# Patient Record
Sex: Male | Born: 1955 | Race: Black or African American | Hispanic: No | Smoking: Never smoker
Health system: Southern US, Community
[De-identification: ages and names within clinical notes are randomized; demographics above are authoritative.]

## PROBLEM LIST (undated history)

## (undated) DIAGNOSIS — N529 Male erectile dysfunction, unspecified: Secondary | ICD-10-CM

## (undated) DIAGNOSIS — M1990 Unspecified osteoarthritis, unspecified site: Secondary | ICD-10-CM

## (undated) DIAGNOSIS — I10 Essential (primary) hypertension: Secondary | ICD-10-CM

## (undated) DIAGNOSIS — E291 Testicular hypofunction: Secondary | ICD-10-CM

## (undated) HISTORY — PX: REPLACEMENT TOTAL KNEE: SUR1224

## (undated) HISTORY — DX: Testicular hypofunction: E29.1

## (undated) HISTORY — DX: Unspecified osteoarthritis, unspecified site: M19.90

## (undated) HISTORY — DX: Male erectile dysfunction, unspecified: N52.9

## (undated) HISTORY — DX: Essential (primary) hypertension: I10

---

## 2005-05-20 ENCOUNTER — Ambulatory Visit: Payer: Self-pay | Admitting: Internal Medicine

## 2005-05-26 ENCOUNTER — Ambulatory Visit: Payer: Self-pay | Admitting: Internal Medicine

## 2005-07-06 ENCOUNTER — Ambulatory Visit: Payer: Self-pay | Admitting: *Deleted

## 2005-07-22 ENCOUNTER — Ambulatory Visit: Payer: Self-pay | Admitting: *Deleted

## 2005-07-31 ENCOUNTER — Ambulatory Visit: Payer: Self-pay | Admitting: *Deleted

## 2005-08-07 ENCOUNTER — Ambulatory Visit: Payer: Self-pay | Admitting: *Deleted

## 2005-08-24 ENCOUNTER — Ambulatory Visit: Payer: Self-pay | Admitting: *Deleted

## 2005-08-25 ENCOUNTER — Ambulatory Visit: Payer: Self-pay | Admitting: Internal Medicine

## 2005-11-23 ENCOUNTER — Ambulatory Visit: Payer: Self-pay | Admitting: Internal Medicine

## 2006-08-31 ENCOUNTER — Ambulatory Visit: Payer: Self-pay | Admitting: Internal Medicine

## 2006-08-31 LAB — CONVERTED CEMR LAB
Alkaline Phosphatase: 54 units/L (ref 39–117)
CO2: 26 meq/L (ref 19–32)
Calcium: 9.3 mg/dL (ref 8.4–10.5)
Chloride: 105 meq/L (ref 96–112)
Chol/HDL Ratio, serum: 3.3
Cholesterol: 170 mg/dL (ref 0–200)
GFR calc non Af Amer: 75 mL/min
Glomerular Filtration Rate, Af Am: 91 mL/min/{1.73_m2}
Glucose, Bld: 117 mg/dL — ABNORMAL HIGH (ref 70–99)
Ketones, ur: NEGATIVE mg/dL
LDL Cholesterol: 98 mg/dL (ref 0–99)
Lymphocytes Relative: 31.6 % (ref 12.0–46.0)
MCV: 92.7 fL (ref 78.0–100.0)
Monocytes Relative: 5 % (ref 3.0–11.0)
Nitrite: NEGATIVE
Platelets: 200 10*3/uL (ref 150–400)
Total Protein, Urine: NEGATIVE mg/dL
Urine Glucose: NEGATIVE mg/dL
Urobilinogen, UA: 0.2 (ref 0.0–1.0)

## 2006-09-03 ENCOUNTER — Ambulatory Visit (HOSPITAL_COMMUNITY): Admission: RE | Admit: 2006-09-03 | Discharge: 2006-09-03 | Payer: Self-pay | Admitting: Internal Medicine

## 2006-09-03 ENCOUNTER — Ambulatory Visit: Payer: Self-pay | Admitting: Internal Medicine

## 2006-09-03 LAB — CONVERTED CEMR LAB
Ferritin: 231.3 ng/mL (ref 22.0–322.0)
Rheumatoid Fact: <20 intl units/mL — ABNORMAL LOW (ref 0.0–20.0)

## 2007-05-31 ENCOUNTER — Ambulatory Visit: Payer: Self-pay | Admitting: Internal Medicine

## 2007-12-13 ENCOUNTER — Ambulatory Visit: Payer: Self-pay | Admitting: Internal Medicine

## 2007-12-13 DIAGNOSIS — M1990 Unspecified osteoarthritis, unspecified site: Secondary | ICD-10-CM

## 2007-12-13 DIAGNOSIS — N529 Male erectile dysfunction, unspecified: Secondary | ICD-10-CM

## 2007-12-13 DIAGNOSIS — E559 Vitamin D deficiency, unspecified: Secondary | ICD-10-CM | POA: Insufficient documentation

## 2007-12-13 DIAGNOSIS — M79609 Pain in unspecified limb: Secondary | ICD-10-CM | POA: Insufficient documentation

## 2007-12-13 DIAGNOSIS — R7309 Other abnormal glucose: Secondary | ICD-10-CM

## 2007-12-16 ENCOUNTER — Encounter (INDEPENDENT_AMBULATORY_CARE_PROVIDER_SITE_OTHER): Payer: Self-pay | Admitting: *Deleted

## 2007-12-20 ENCOUNTER — Telehealth: Payer: Self-pay | Admitting: Internal Medicine

## 2007-12-20 LAB — CONVERTED CEMR LAB
Basophils Relative: 0.3 % (ref 0.0–1.0)
CO2: 30 meq/L (ref 19–32)
Chloride: 105 meq/L (ref 96–112)
Eosinophils Relative: 1.8 % (ref 0.0–5.0)
Folate: 9.5 ng/mL
Hemoglobin: 14.9 g/dL (ref 13.0–17.0)
Lymphocytes Relative: 30.7 % (ref 12.0–46.0)
Monocytes Relative: 7 % (ref 3.0–12.0)
Neutro Abs: 5.6 10*3/uL (ref 1.4–7.7)
Potassium: 3.9 meq/L (ref 3.5–5.1)
RBC: 4.86 M/uL (ref 4.22–5.81)
Rhuematoid fact SerPl-aCnc: <20 intl units/mL — ABNORMAL LOW (ref 0.0–20.0)
Sed Rate: 11 mm/hr (ref 0–16)
Sodium: 141 meq/L (ref 135–145)
TSH: 1.81 microintl units/mL (ref 0.35–5.50)
Uric Acid, Serum: 7.8 mg/dL (ref 4.0–7.8)

## 2007-12-30 ENCOUNTER — Encounter: Payer: Self-pay | Admitting: Internal Medicine

## 2008-08-06 ENCOUNTER — Telehealth: Payer: Self-pay | Admitting: Internal Medicine

## 2008-12-04 ENCOUNTER — Ambulatory Visit: Payer: Self-pay | Admitting: Internal Medicine

## 2008-12-04 LAB — CONVERTED CEMR LAB
AST: 26 units/L (ref 0–37)
Albumin: 4 g/dL (ref 3.5–5.2)
Alkaline Phosphatase: 70 units/L (ref 39–117)
Basophils Relative: 0.2 % (ref 0.0–3.0)
Calcium: 9.1 mg/dL (ref 8.4–10.5)
Cholesterol: 209 mg/dL — ABNORMAL HIGH (ref 0–200)
Direct LDL: 112.6 mg/dL
Eosinophils Relative: 1.1 % (ref 0.0–5.0)
GFR calc non Af Amer: 90 mL/min (ref >60)
Glucose, Bld: 262 mg/dL — ABNORMAL HIGH (ref 70–99)
HCT: 41.7 % (ref 39.0–52.0)
HDL: 57.9 mg/dL (ref >39.00)
Hemoglobin: 14.6 g/dL (ref 13.0–17.0)
Leukocytes, UA: NEGATIVE
Lymphocytes Relative: 33 % (ref 12.0–46.0)
Lymphs Abs: 2.7 10*3/uL (ref 0.7–4.0)
Monocytes Relative: 6.2 % (ref 3.0–12.0)
Neutro Abs: 4.8 10*3/uL (ref 1.4–7.7)
Nitrite: NEGATIVE
PSA: 0.79 ng/mL (ref 0.10–4.00)
Potassium: 4.4 meq/L (ref 3.5–5.1)
RBC: 4.59 M/uL (ref 4.22–5.81)
RDW: 12.2 % (ref 11.5–14.6)
Triglycerides: 197 mg/dL — ABNORMAL HIGH (ref 0.0–149.0)
Urobilinogen, UA: 0.2 (ref 0.0–1.0)
VLDL: 39.4 mg/dL (ref 0.0–40.0)
WBC: 8.1 10*3/uL (ref 4.5–10.5)

## 2008-12-11 ENCOUNTER — Ambulatory Visit: Payer: Self-pay | Admitting: Internal Medicine

## 2008-12-11 DIAGNOSIS — E119 Type 2 diabetes mellitus without complications: Secondary | ICD-10-CM | POA: Insufficient documentation

## 2008-12-11 DIAGNOSIS — E1165 Type 2 diabetes mellitus with hyperglycemia: Secondary | ICD-10-CM

## 2008-12-26 ENCOUNTER — Ambulatory Visit: Payer: Self-pay | Admitting: *Deleted

## 2008-12-28 ENCOUNTER — Telehealth: Payer: Self-pay | Admitting: Internal Medicine

## 2009-01-08 ENCOUNTER — Ambulatory Visit: Payer: Self-pay | Admitting: Internal Medicine

## 2009-01-08 DIAGNOSIS — L259 Unspecified contact dermatitis, unspecified cause: Secondary | ICD-10-CM

## 2009-01-14 ENCOUNTER — Telehealth: Payer: Self-pay | Admitting: Internal Medicine

## 2009-03-05 ENCOUNTER — Encounter: Admission: RE | Admit: 2009-03-05 | Discharge: 2009-03-12 | Payer: Self-pay | Admitting: Family Medicine

## 2010-08-15 ENCOUNTER — Telehealth: Payer: Self-pay | Admitting: Internal Medicine

## 2010-08-19 ENCOUNTER — Ambulatory Visit: Payer: Self-pay | Admitting: Internal Medicine

## 2010-08-19 DIAGNOSIS — R0609 Other forms of dyspnea: Secondary | ICD-10-CM

## 2010-08-19 DIAGNOSIS — N509 Disorder of male genital organs, unspecified: Secondary | ICD-10-CM | POA: Insufficient documentation

## 2010-08-19 DIAGNOSIS — E291 Testicular hypofunction: Secondary | ICD-10-CM | POA: Insufficient documentation

## 2010-08-19 DIAGNOSIS — R5383 Other fatigue: Secondary | ICD-10-CM

## 2010-08-19 DIAGNOSIS — R0989 Other specified symptoms and signs involving the circulatory and respiratory systems: Secondary | ICD-10-CM | POA: Insufficient documentation

## 2010-08-19 DIAGNOSIS — E538 Deficiency of other specified B group vitamins: Secondary | ICD-10-CM | POA: Insufficient documentation

## 2010-08-19 DIAGNOSIS — R5381 Other malaise: Secondary | ICD-10-CM

## 2010-08-19 LAB — CONVERTED CEMR LAB
Rhuematoid fact SerPl-aCnc: <20 intl units/mL (ref 0–20)
Vit D, 25-Hydroxy: 17 ng/mL — ABNORMAL LOW (ref 30–89)

## 2010-08-21 LAB — CONVERTED CEMR LAB
ALT: 25 units/L (ref 0–53)
AST: 21 units/L (ref 0–37)
Albumin: 4 g/dL (ref 3.5–5.2)
BUN: 18 mg/dL (ref 6–23)
Basophils Absolute: 0 10*3/uL (ref 0.0–0.1)
Bilirubin Urine: NEGATIVE
Eosinophils Relative: 2 % (ref 0.0–5.0)
GFR calc non Af Amer: 92.32 mL/min (ref >60.00)
Ketones, ur: NEGATIVE mg/dL
Leukocytes, UA: NEGATIVE
Lymphocytes Relative: 25.6 % (ref 12.0–46.0)
Lymphs Abs: 2.5 10*3/uL (ref 0.7–4.0)
Monocytes Absolute: 0.6 10*3/uL (ref 0.1–1.0)
Nitrite: NEGATIVE
Platelets: 189 10*3/uL (ref 150.0–400.0)
Potassium: 4.6 meq/L (ref 3.5–5.1)
RDW: 14.2 % (ref 11.5–14.6)
Sodium: 145 meq/L (ref 135–145)
Specific Gravity, Urine: >=1.03 (ref 1.000–1.030)
TSH: 1.07 microintl units/mL (ref 0.35–5.50)
Uric Acid, Serum: 7.7 mg/dL (ref 4.0–7.8)
Urine Glucose: NEGATIVE mg/dL
Urobilinogen, UA: 0.2 (ref 0.0–1.0)
Vitamin B-12: 141 pg/mL — ABNORMAL LOW (ref 211–911)
WBC: 9.9 10*3/uL (ref 4.5–10.5)
pH: 5 (ref 5.0–8.0)

## 2010-08-22 ENCOUNTER — Ambulatory Visit: Payer: Self-pay | Admitting: Internal Medicine

## 2010-08-26 ENCOUNTER — Telehealth: Payer: Self-pay | Admitting: Internal Medicine

## 2010-10-15 ENCOUNTER — Ambulatory Visit: Admit: 2010-10-15 | Payer: Self-pay | Admitting: Internal Medicine

## 2010-10-15 ENCOUNTER — Other Ambulatory Visit: Payer: Self-pay

## 2010-10-16 NOTE — Progress Notes (Signed)
Summary: [facility]  Phone Note From Pharmacy   Summary of Call: [facility] , pt must try and fail generic Omeprazole or Naproxen. Initial call taken by: Dagoberto Reef,  August 26, 2010 4:39 PM  Follow-up for Phone Call        ok Follow-up by: Tresa Garter MD,  August 26, 2010 10:45 PM  Additional Follow-up for Phone Call Additional follow up Details #1::        Pt's # busy...............Marland KitchenLamar Sprinkles, CMA  August 27, 2010 1:21 PM   left mess to call office back on wk #..................Marland KitchenLamar Sprinkles, CMA  August 28, 2010 2:33 PM     Additional Follow-up for Phone Call Additional follow up Details #2::    Pt informed  Follow-up by: Lamar Sprinkles, CMA,  August 28, 2010 2:39 PM  New/Updated Medications: NAPROXEN 500 MG TABS (NAPROXEN) 1 by mouth two times a day pc for pain/arthritis OMEPRAZOLE 40 MG CPDR (OMEPRAZOLE) 1 by mouth qam while on  Naproxen Prescriptions: OMEPRAZOLE 40 MG CPDR (OMEPRAZOLE) 1 by mouth qam while on  Naproxen  #30 x 3   Entered by:   Lamar Sprinkles, CMA   Authorized by:   Tresa Garter MD   Signed by:   Lamar Sprinkles, CMA on 08/28/2010   Method used:   Electronically to        [facility] (retail)       [address]       [place], [place]  [address]       Ph: [phone]       Fax: [phone]   RxID:   [id number] NAPROXEN 500 MG TABS (NAPROXEN) 1 by mouth two times a day pc for pain/arthritis  #60 x 3   Entered by:   Lamar Sprinkles, CMA   Authorized by:   Tresa Garter MD   Signed by:   Lamar Sprinkles, CMA on 08/28/2010   Method used:   Electronically to        [facility] (retail)       [address]       [place], [place]  [address]       Ph: [phone]       Fax: [phone]   RxID:   [id number] OMEPRAZOLE 40 MG CPDR (OMEPRAZOLE) 1 by mouth qam while on  Naproxen  #30 x 3   Entered and Authorized by:   Tresa Garter MD   Signed by:    Lamar Sprinkles, CMA on 08/27/2010   Method used:   Electronically to        [id number] (retail)       [address]       [place], [place]  [address]       Ph: [phone] or [phone]       Fax: [phone]   RxID:   [id number] NAPROXEN 500 MG TABS (NAPROXEN) 1 by mouth two times a day pc for pain/arthritis  #60 x 3   Entered by:   Lamar Sprinkles, CMA   Authorized by:   Tresa Garter MD   Signed by:   Lamar Sprinkles, CMA on 08/27/2010   Method used:   [facility]ronically to        [id number] ([facility]       [address]  [place], [place]  [address]       Ph: [phone] or [phone]       Fax: [phone]   RxID:   [id number]

## 2010-10-16 NOTE — Assessment & Plan Note (Signed)
Summary: follow up per md/start b12-lb   Vital Signs:  Patient profile:   55 year old male Height:      72 inches Weight:      232 pounds BMI:     31.58 Temp:     97.6 degrees F oral Pulse rate:   88 / minute Pulse rhythm:   regular Resp:     16 per minute BP sitting:   130 / 90  (left arm) Cuff size:   large  Vitals Entered By: Lanier Prude, Beverly Gust) (August 22, 2010 11:24 AM) CC: f/u to discuss b12 def Is Patient Diabetic? Yes   Primary Care Provider:  Tresa Garter MD  CC:  f/u to discuss b12 def.  History of Present Illness: F/u low Vit B12, low Vit D and hypogonadism  Current Medications (verified): 1)  Vitamin D3 1000 Unit  Tabs (Cholecalciferol) .Marland Kitchen.. 1 By Mouth Daily 2)  Hydrocodone-Acetaminophen 5-325 Mg  Tabs (Hydrocodone-Acetaminophen) .Marland Kitchen.. 1 By Mouth Two Times A Day As Needed Pain 3)  Levitra 20 Mg Tabs (Vardenafil Hcl) .... 1/2-1 Once Daily As Needed 4)  Colchicine 0.6 Mg Tabs (Colchicine) .Marland Kitchen.. 1 By Mouth Qd 5)  Metformin Hcl 500 Mg Tabs (Metformin Hcl) .... Take 3 in The Am and 2 in The Evening 6)  Freestyle Lite Test  Strp (Glucose Blood) .... Use Two Times A Day Prn 7)  Freestyle Lancets  Misc (Lancets) .... Two Times A Day Prn 8)  Lotrisone 1-0.05 % Crea (Clotrimazole-Betamethasone) .... Use Bid 9)  Aspir-Low 81 Mg Tbec (Aspirin) .Marland Kitchen.. 1 By Mouth Once Daily 10)  Vimovo 500-20 Mg Tbec (Naproxen-Esomeprazole) .Marland Kitchen.. 1 By Mouth Once Daily - Two Times A Day Pc As Needed Pain 11)  Doxycycline Hyclate 100 Mg Caps (Doxycycline Hyclate) .Marland Kitchen.. 1 By Mouth Two Times A Day With A Glass of Water 12)  Lotrisone 1-0.05 % Crea (Clotrimazole-Betamethasone) .... Use Bid 13)  Vitamin B-12 1000 Mcg Subl (Cyanocobalamin) .Marland Kitchen.. 1 By Mouth Qd 14)  Vitamin D (Ergocalciferol) 50000 Unit Caps (Ergocalciferol) .Marland Kitchen.. 1 By Mouth Q 1 Week X 6 Weeks Then Start Vit D 1000 International Units Qd  Allergies (verified): No Known Drug Allergies  Past History:  Past Medical  History: Last updated: 08/19/2010 Osteoarthritis Elev. glu ED Vit D def 2009 Diabetes mellitus, type II 2010 Vit B12 def 2011 Hypogonadism 2011  Social History: Last updated: 12/11/2008 Occupation: cleaning service Divorced Alcohol use-yes Regular exercise-yes  Review of Systems  The patient denies chest pain, dyspnea on exertion, and abdominal pain.    Physical Exam  General:  Well-developed,well-nourished,in no acute distress; alert,appropriate and cooperative throughout examination Nose:  External nasal examination shows no deformity or inflammation. Nasal mucosa are pink and moist without lesions or exudates. Mouth:  Oral mucosa and oropharynx without lesions or exudates.  Teeth in good repair.pharynx pink and moist.   Lungs:  Normal respiratory effort, chest expands symmetrically. Lungs are clear to auscultation, no crackles or wheezes. Heart:  Normal rate and regular rhythm. S1 and S2 normal without gallop, murmur, click, rub or other extra sounds. Abdomen:  Bowel sounds positive,abdomen soft and non-tender without masses, organomegaly or hernias noted.   Impression & Recommendations:  Problem # 1:  VITAMIN B12 DEFICIENCY (ICD-266.2) Assessment New Discussed On the regimen of medicine(s) reflected in the chart    Orders: Vit B12 1000 mcg (J3420)  Problem # 2:  HYPOGONADISM (ICD-257.2) Assessment: New  Options to treat discussed Will start Testim Risks vs benefits and  controversies of a long term Testosterone use were discussed.  Orders: Admin of Therapeutic Inj  intramuscular or subcutaneous ([address])  Problem # 3:  VITAMIN D DEFICIENCY (ICD-268.9) Assessment: Deteriorated On the regimen of medicine(s) reflected in the chart    Problem # 4:  FATIGUE (ICD-780.79) Assessment: Unchanged  Complete Medication List: 1)  Vitamin D3 1000 Unit Tabs (Cholecalciferol) .Marland Kitchen.. 1 by mouth daily 2)  Hydrocodone-acetaminophen 5-325 Mg Tabs (Hydrocodone-acetaminophen) .Marland Kitchen..  1 by mouth two times a day as needed pain 3)  Levitra 20 Mg Tabs (Vardenafil hcl) .... 1/2-1 once daily as needed 4)  Colchicine 0.6 Mg Tabs (Colchicine) .Marland Kitchen.. 1 by mouth qd 5)  Metformin Hcl 500 Mg Tabs (Metformin hcl) .... Take 3 in the am and 2 in the evening 6)  Freestyle Lite Test Strp (Glucose blood) .... Use two times a day prn 7)  Freestyle Lancets Misc (Lancets) .... Two times a day prn 8)  Lotrisone 1-0.05 % Crea (Clotrimazole-betamethasone) .... Use bid 9)  Aspir-low 81 Mg Tbec (Aspirin) .Marland Kitchen.. 1 by mouth once daily 10)  Vimovo 500-20 Mg Tbec (Naproxen-esomeprazole) .Marland Kitchen.. 1 by mouth once daily - two times a day pc as needed pain 11)  Doxycycline Hyclate 100 Mg Caps (Doxycycline hyclate) .Marland Kitchen.. 1 by mouth two times a day with a glass of water 12)  Lotrisone 1-0.05 % Crea (Clotrimazole-betamethasone) .... Use bid 13)  Vitamin B-12 1000 Mcg Subl (Cyanocobalamin) .Marland Kitchen.. 1 by mouth qd 14)  Vitamin D (ergocalciferol) 50000 Unit Caps (Ergocalciferol) .Marland Kitchen.. 1 by mouth q 1 week x 6 weeks then start vit d 1000 international units qd 15)  Testim 50 Mg/5gm Gel (Testosterone) .... Use 10 g on skin as dirr q am  Patient Instructions: 1)  Please schedule a follow-up appointment in 2 months. 2)  BMP prior to visit, ICD-9: 3)  Hepatic Panel prior to visit, ICD-9: 4)  testost 995.20 5)  vit B12 266.20 6)  vit D 268.9 Prescriptions: VITAMIN B-12 1000 MCG SUBL (CYANOCOBALAMIN) 1 by mouth qd  #100 x 3   Entered and Authorized by:   Tresa Garter MD   Signed by:   Tresa Garter MD on 08/22/2010   Method used:   Print then Give to Patient   RxID:   [id number] TESTIM 50 MG/5GM GEL (TESTOSTERONE) use 10 g on skin as dirr q am  #qs x 6   Entered and Authorized by:   Tresa Garter MD   Signed by:   Tresa Garter MD on 08/22/2010   Method used:   Print then Give to Patient   RxID:   [id number]    Medication Administration  Injection # 1:    Medication: Vit B12 1000  mcg    Diagnosis: VITAMIN B12 DEFICIENCY (ICD-266.2)    Route: IM    Site: L deltoid    Exp Date: 04/14/2012    Lot #: [id number]    Mfr: [employer]    Patient tolerated injection without complications    Given by: Lanier Prude, CMA(AAMA) (August 22, 2010 11:59 AM)  Orders Added: 1)  Vit B12 1000 mcg [J3420] 2)  Admin of Therapeutic Inj  intramuscular or subcutaneous [[address]] 3)  Est. Patient Level IV [[address]]

## 2010-10-16 NOTE — Progress Notes (Signed)
Summary: Levitra  Phone Note Call from Patient Call back at Work Phone [phone]   Summary of Call: Patient is requesting rx for levitra. Next office visit 12/6 Initial call taken by: Lamar Sprinkles, CMA,  August 15, 2010 1:39 PM  Follow-up for Phone Call        ok to ref 2 months  Follow-up by: Tresa Garter MD,  August 15, 2010 5:36 PM  Additional Follow-up for Phone Call Additional follow up Details #1::        Left vm for pt to check w/his pharm Additional Follow-up by: Lamar Sprinkles, CMA,  August 15, 2010 5:47 PM    Prescriptions: LEVITRA 20 MG TABS (VARDENAFIL HCL) 1/2-1 once daily as needed  #12 x 0   Entered by:   Lamar Sprinkles, CMA   Authorized by:   Tresa Garter MD   Signed by:   Lamar Sprinkles, CMA on 08/15/2010   Method used:   [facility] to        [id number]       [address]       [place], [place]  [address]       Ph: [phone] or [phone]       Fax: [phone]   RxID:   [id number]

## 2010-10-16 NOTE — Assessment & Plan Note (Signed)
Summary: last ov 2010    Vital Signs:  Patient profile:   55 year old male Height:      72 inches Weight:      233 pounds BMI:     31.71 Temp:     98.8 degrees F oral Pulse rate:   88 / minute Pulse rhythm:   regular Resp:     16 per minute BP sitting:   130 / 80  (left arm) Cuff size:   large  Vitals Entered By: Lanier Prude, Beverly Gust) (August 19, 2010 2:15 PM) CC: fatigue, constant pain all over Is Patient Diabetic? Yes Comments pt is not taking Meloxicam, hydroco or colchicine   Primary Care Provider:  Georgina Quint Plotnikov MD  CC:  fatigue and constant pain all over.  History of Present Illness: C/o being tired all the time He has been tired in am C/o peing a lot x 12 months  The patient presents for a follow up of hypertension, diabetes, hyperlipidemia  C/o R testicle sensitive to touch x 12 months   Current Medications (verified): 1)  Vitamin D3 1000 Unit  Tabs (Cholecalciferol) .Marland Kitchen.. 1 By Mouth Daily 2)  Meloxicam 15 Mg Tabs (Meloxicam) .... One By Mouth Daily Pc As Needed Pain 3)  Hydrocodone-Acetaminophen 5-325 Mg  Tabs (Hydrocodone-Acetaminophen) .Marland Kitchen.. 1 By Mouth Two Times A Day As Needed Pain 4)  Levitra 20 Mg Tabs (Vardenafil Hcl) .... 1/2-1 Once Daily As Needed 5)  Colchicine 0.6 Mg Tabs (Colchicine) .Marland Kitchen.. 1 By Mouth Qd 6)  Metformin Hcl 500 Mg Tabs (Metformin Hcl) .... Take 3 in The Am and 2 in The Evening 7)  Freestyle Lite Test  Strp (Glucose Blood) .... Use Two Times A Day Prn 8)  Freestyle Lancets  Misc (Lancets) .... Two Times A Day Prn 9)  Lotrisone 1-0.05 % Crea (Clotrimazole-Betamethasone) .... Use Bid 10)  Phentermine Hcl 37.5 Mg Tabs (Phentermine Hcl) .Marland Kitchen.. 1 By Mouth Qam 11)  Aspir-Low 81 Mg Tbec (Aspirin) .Marland Kitchen.. 1 By Mouth Once Daily  Allergies (verified): No Known Drug Allergies  Past History:  Past Medical History: Osteoarthritis Elev. glu ED Vit D def 2009 Diabetes mellitus, type II 2010 Vit B12 def 2011 Hypogonadism 2011  Family  History: Reviewed history from 12/13/2007 and no changes required. DM 2  Social History: Reviewed history from 12/11/2008 and no changes required. Occupation: Pensions consultant Divorced Alcohol use-yes Regular exercise-yes  Review of Systems       The patient complains of weight gain and difficulty walking.  The patient denies anorexia, fever, weight loss, vision loss, decreased hearing, hoarseness, chest pain, syncope, dyspnea on exertion, peripheral edema, prolonged cough, headaches, hemoptysis, abdominal pain, melena, hematochezia, severe indigestion/heartburn, hematuria, incontinence, genital sores, muscle weakness, suspicious skin lesions, transient blindness, depression, unusual weight change, abnormal bleeding, enlarged lymph nodes, angioedema, and testicular masses.         lack of libido  Physical Exam  General:  Well-developed,well-nourished,in no acute distress; alert,appropriate and cooperative throughout examination Nose:  External nasal examination shows no deformity or inflammation. Nasal mucosa are pink and moist without lesions or exudates. Mouth:  Oral mucosa and oropharynx without lesions or exudates.  Teeth in good repair.pharynx pink and moist.   Lungs:  Normal respiratory effort, chest expands symmetrically. Lungs are clear to auscultation, no crackles or wheezes. Heart:  Normal rate and regular rhythm. S1 and S2 normal without gallop, murmur, click, rub or other extra sounds. Abdomen:  Bowel sounds positive,abdomen soft and non-tender without masses,  organomegaly or hernias noted. Genitalia:  B testicles are atrophic; R epydid is tender   Impression & Recommendations:  Problem # 1:  FATIGUE (ICD-780.79) due to vit def and hypogonadism Assessment Deteriorated  Orders: T-Vitamin D (25-Hydroxy) [phone]) T-RA (Rheumatoid Factor) [phone]) TLB-B12, Serum-Total ONLY (29562-Z30) TLB-BMP (Basic Metabolic Panel-BMET) (80048-METABOL) TLB-CBC Platelet -  w/Differential (85025-CBCD) TLB-Hepatic/Liver Function Pnl (80076-HEPATIC) TLB-TSH (Thyroid Stimulating Hormone) (84443-TSH) TLB-Uric Acid, Blood (84550-URIC) TLB-Udip ONLY (81003-UDIP) TLB-Sedimentation Rate (ESR) (85652-ESR) TLB-A1C / Hgb A1C (Glycohemoglobin) (83036-A1C) TLB-Testosterone, Total (84403-TESTO)  Problem # 2:  DIABETES MELLITUS, TYPE II (ICD-250.00) Assessment: Comment Only  His updated medication list for this problem includes:    Metformin Hcl 500 Mg Tabs (Metformin hcl) .Marland Kitchen... Take 3 in the am and 2 in the evening    Aspir-low 81 Mg Tbec (Aspirin) .Marland Kitchen... 1 by mouth once daily  Orders: T-Vitamin D (25-Hydroxy) [phone]) T-RA (Rheumatoid Factor) [phone]) TLB-B12, Serum-Total ONLY (01027-O53) TLB-BMP (Basic Metabolic Panel-BMET) (80048-METABOL) TLB-CBC Platelet - w/Differential (85025-CBCD) TLB-Hepatic/Liver Function Pnl (80076-HEPATIC) TLB-TSH (Thyroid Stimulating Hormone) (84443-TSH) TLB-Uric Acid, Blood (84550-URIC) TLB-Udip ONLY (81003-UDIP) TLB-Sedimentation Rate (ESR) (85652-ESR) TLB-A1C / Hgb A1C (Glycohemoglobin) (83036-A1C)  Problem # 3:  OSTEOARTHRITIS (ICD-715.90) Assessment: Deteriorated  The following medications were removed from the medication list:    Meloxicam 15 Mg Tabs (Meloxicam) ..... One by mouth daily pc as needed pain His updated medication list for this problem includes:    Hydrocodone-acetaminophen 5-325 Mg Tabs (Hydrocodone-acetaminophen) .Marland Kitchen... 1 by mouth two times a day as needed pain    Aspir-low 81 Mg Tbec (Aspirin) .Marland Kitchen... 1 by mouth once daily    Vimovo 500-20 Mg Tbec (Naproxen-esomeprazole) .Marland Kitchen... 1 by mouth once daily - two times a day pc as needed pain  Orders: T-Vitamin D (25-Hydroxy) [phone]) T-RA (Rheumatoid Factor) [phone]) TLB-B12, Serum-Total ONLY (33295-J88) TLB-BMP (Basic Metabolic Panel-BMET) (80048-METABOL) TLB-CBC Platelet - w/Differential (85025-CBCD) TLB-Hepatic/Liver Function Pnl  (80076-HEPATIC) TLB-TSH (Thyroid Stimulating Hormone) (84443-TSH) TLB-Uric Acid, Blood (84550-URIC) TLB-Udip ONLY (81003-UDIP) TLB-Sedimentation Rate (ESR) (85652-ESR) TLB-A1C / Hgb A1C (Glycohemoglobin) (83036-A1C)  Problem # 4:  ERECTILE DYSFUNCTION (CZY-606.30) Assessment: Unchanged  His updated medication list for this problem includes:    Levitra 20 Mg Tabs (Vardenafil hcl) .Marland Kitchen... 1/2-1 once daily as needed  Orders: T-Vitamin D (25-Hydroxy) [phone]) T-RA (Rheumatoid Factor) [phone]) TLB-B12, Serum-Total ONLY (70623-J62) TLB-BMP (Basic Metabolic Panel-BMET) (80048-METABOL) TLB-CBC Platelet - w/Differential (85025-CBCD) TLB-Hepatic/Liver Function Pnl (80076-HEPATIC) TLB-TSH (Thyroid Stimulating Hormone) (84443-TSH) TLB-Uric Acid, Blood (84550-URIC) TLB-Udip ONLY (81003-UDIP) TLB-Sedimentation Rate (ESR) (85652-ESR) TLB-A1C / Hgb A1C (Glycohemoglobin) (83036-A1C)  Problem # 5:  SNORING (ICD-786.09) Assessment: Unchanged Denies OSA  Problem # 6:  TESTICULAR PAIN (ICD-608.9) R Assessment: New  Problem # 7:  HYPOGONADISM (ICD-257.2) Assessment: New Labs reviewed  Problem # 8:  VITAMIN B12 DEFICIENCY (ICD-266.2) Assessment: New Start Rx  Problem # 9:  VITAMIN D DEFICIENCY (ICD-268.9) Assessment: Deteriorated Restart Rx  Complete Medication List: 1)  Vitamin D3 1000 Unit Tabs (Cholecalciferol) .Marland Kitchen.. 1 by mouth daily 2)  Hydrocodone-acetaminophen 5-325 Mg Tabs (Hydrocodone-acetaminophen) .Marland Kitchen.. 1 by mouth two times a day as needed pain 3)  Levitra 20 Mg Tabs (Vardenafil hcl) .... 1/2-1 once daily as needed 4)  Colchicine 0.6 Mg Tabs (Colchicine) .Marland Kitchen.. 1 by mouth qd 5)  Metformin Hcl 500 Mg Tabs (Metformin hcl) .... Take 3 in the am and 2 in the evening 6)  Freestyle Lite Test Strp (Glucose blood) .... Use two times a day prn 7)  Freestyle Lancets Misc (Lancets) .... Two times a day prn 8)  Lotrisone  1-0.05 % Crea (Clotrimazole-betamethasone) .... Use bid 9)   Aspir-low 81 Mg Tbec (Aspirin) .Marland Kitchen.. 1 by mouth once daily 10)  Vimovo 500-20 Mg Tbec (Naproxen-esomeprazole) .Marland Kitchen.. 1 by mouth once daily - two times a day pc as needed pain 11)  Doxycycline Hyclate 100 Mg Caps (Doxycycline hyclate) .Marland Kitchen.. 1 by mouth two times a day with a glass of water 12)  Lotrisone 1-0.05 % Crea (Clotrimazole-betamethasone) .... Use bid 13)  Vitamin B-12 1000 Mcg Subl (Cyanocobalamin) .Marland Kitchen.. 1 by mouth qd 14)  Vitamin D (ergocalciferol) 50000 Unit Caps (Ergocalciferol) .Marland Kitchen.. 1 by mouth q 1 week x 6 weeks then start vit d 1000 international units qd  Patient Instructions: 1)  Please schedule a follow-up appointment in 2 months. 2)  Call if you are not better in a reasonable amount of time or if worse.  Prescriptions: VITAMIN D (ERGOCALCIFEROL) 50000 UNIT CAPS (ERGOCALCIFEROL) 1 by mouth q 1 week x 6 weeks then start Vit D 1000 international units qd  #6 x 0   Entered and Authorized by:   Tresa Garter MD   Signed by:   Tresa Garter MD on 08/20/2010   Method used:   [facility] to        [id number]       [address]       [place], [place]  [address]       Ph: [phone] or [phone]       Fax: [phone]   RxID:   [id number] VITAMIN B-12 1000 MCG SUBL (CYANOCOBALAMIN) 1 by mouth qd  #100 x 3   Entered and Authorized by:   Tresa Garter MD   Signed by:   Tresa Garter MD on 08/20/2010   Method used:   [facility]ically to        [facility]       [address]       [place], [place]  [address]       Ph: [phone] or [phone]       Fax: [phone]   RxID:   [id number] LOTRISONE 1-0.05 % CREA (CLOTRIMAZOLE-BETAMETHASONE) use bid  #90 g x 1   Entered and Authorized by:   Tresa Garter MD   Signed by:   Tresa Garter MD on 08/19/2010   Method used:   Print then Give to Patient   RxID:   [id number] METFORMIN HCL 500 MG TABS  (METFORMIN HCL) Take 3 in the am and 2 in the evening  #150 x 11   Entered and Authorized by:   Tresa Garter MD   Signed by:   Tresa Garter MD on 08/19/2010   Method used:   Print then Give to Patient   RxID:   [id number] LEVITRA 20 MG TABS (VARDENAFIL HCL) 1/2-1 once daily as needed  #12 x 0   Entered and Authorized by:   Tresa Garter MD   Signed by:   Tresa Garter MD on 08/19/2010   Method used:   Print then Give to Patient   RxID:   [id number] HYDROCODONE-ACETAMINOPHEN 5-325 MG  TABS (HYDROCODONE-ACETAMINOPHEN) 1 by mouth two times a day as needed pain  #60 x 0   Entered and Authorized by:   Tresa Garter MD   Signed by:   Tresa Garter MD on 08/19/2010   Method  used:   Print then Give to Patient   RxID:   [id number] VITAMIN D3 1000 UNIT  TABS (CHOLECALCIFEROL) 1 by mouth daily  #100 x 3   Entered and Authorized by:   Tresa Garter MD   Signed by:   Tresa Garter MD on 08/19/2010   Method used:   Print then Give to Patient   RxID:   [id number] DOXYCYCLINE HYCLATE 100 MG CAPS (DOXYCYCLINE HYCLATE) 1 by mouth two times a day with a glass of water  #20 x 0   Entered and Authorized by:   Tresa Garter MD   Signed by:   Tresa Garter MD on 08/19/2010   Method used:   Print then Give to Patient   RxID:   [id number] VIMOVO 500-20 MG TBEC (NAPROXEN-ESOMEPRAZOLE) 1 by mouth once daily - two times a day pc as needed pain  #60 x 3   Entered and Authorized by:   Tresa Garter MD   Signed by:   Tresa Garter MD on 08/19/2010   Method used:   Print then Give to Patient   RxID:   [id number]    Orders Added: 1)  T-Vitamin D (25-Hydroxy) [33295-18841] 2)  T-RA (Rheumatoid Factor) [66063-01601] 3)  TLB-B12, Serum-Total ONLY [82607-B12] 4)  TLB-BMP (Basic Metabolic Panel-BMET) [80048-METABOL] 5)  TLB-CBC Platelet - w/Differential [85025-CBCD] 6)  TLB-Hepatic/Liver Function Pnl  [80076-HEPATIC] 7)  TLB-TSH (Thyroid Stimulating Hormone) [84443-TSH] 8)  TLB-Uric Acid, Blood [84550-URIC] 9)  TLB-Udip ONLY [81003-UDIP] 10)  TLB-Sedimentation Rate (ESR) [85652-ESR] 11)  TLB-A1C / Hgb A1C (Glycohemoglobin) [83036-A1C] 12)  TLB-Testosterone, Total [84403-TESTO] 13)  Est. Patient Level V [09323]

## 2010-10-21 ENCOUNTER — Ambulatory Visit: Payer: Self-pay | Admitting: Internal Medicine

## 2011-01-30 NOTE — Assessment & Plan Note (Signed)
[facility]                           PRIMARY CARE OFFICE NOTE   TAVARAS, GOODY                        MRN:          [id number]  DATE:09/09/2006                            DOB:          1956-06-18    The patient is a 55 year old male who presents for a wellness  examination.   PAST MEDICAL HISTORY/FAMILY HISTORY/SOCIAL HISTORY:  As per May 31, 1998 note and August 25, 2005 note.  No history of inflammatory  arthritis in the family.   CURRENT MEDICATIONS:  He was taking over-the-counter antiinflammatory  medicines.   REVIEW OF SYSTEMS:  He has been in a lot of pain all over, limping for  months, gradually getting worse.  Now, he has to assist himself a lot to  get in and out of the [place].  It is very hard for him to walk up the  steps.  Very stiff in the mornings.  Nonsteroidals did help.  He rates  his pain 10/10.  The worst pain that he has is in both knees and the  right hip.  No numbness or tingling.  The rest of the 18-point system  review is negative.  Specifically, no skin lesions.  No urinary  complaints.  No vision loss or change.  No redness or swelling of the  joints.   PHYSICAL:  Blood pressure 123/75, pulse 47, temperature 99.2, weight 142  pounds.  He is in no acute distress, though limping when walking.  HEENT:  Moist mucosa.  No lesions.  LUNGS:  Clear.  No wheeze or rales.  HEART:  S1, S2 no gallops.  ABDOMEN:  Soft, nontender.  No organomegaly or mass felt.  LOWER EXTREMITIES:  Without edema.  NECK:  Supple.  No thyromegaly or bruit.  SKIN:  Clear.  Joints without deformities.  His gait is disturbed with limping.  Pain  with range of motion in the knees, ankles, and hips.  Denies being depressed.   LABS:  On August 31, 2006, CBC normal, cholesterol 170, LDL is 98,  glucose 117, ALT is normal, AST 41.  PSA normal.  TSH normal.  Urinalysis normal.  EKG is normal today.   ASSESSMENT AND PLAN:  1. Normal  wellness examination.  Age/health issues discussed.  Healthy      life-style discussed.  Repeat exam in 12 months.  2. Severe arthralgias of unclear etiology.  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  Will obtain sed rate, ANA, HLA B27, and rheumatoid      factor, as well as hepatitis tests, ferritin.  Obtain pelvis x-ray.      Given prednisone 20 mg daily for 5 days, 15 mg a day for 5 days, 10      mg daily for 10 days, 5 mg daily for 5 days, then switch to Relafen      750 p.o. b.i.d., Ultram 50 mg 1 to 2 q.i.d. p.r.n. #120 with 6      refills.  I will see him back in 1 month.  We need a rheumatology      consult.  3. Elevated ALT.  Will monitor.  Likely due to high amounts of      Tylenol.  Will discontinue.  4. Elevated glucose.  He will need to lose weight or his prednisone      can make it worse.  Will recheck.     Georgina Quint. Plotnikov, MD  Electronically Signed    AVP/MedQ  DD: 09/09/2006  DT: 09/09/2006  Job #: [id number]

## 2011-02-10 ENCOUNTER — Encounter: Payer: Self-pay | Admitting: Internal Medicine

## 2011-02-10 ENCOUNTER — Ambulatory Visit (INDEPENDENT_AMBULATORY_CARE_PROVIDER_SITE_OTHER): Payer: 59 | Admitting: Internal Medicine

## 2011-02-10 DIAGNOSIS — E538 Deficiency of other specified B group vitamins: Secondary | ICD-10-CM

## 2011-02-10 DIAGNOSIS — E559 Vitamin D deficiency, unspecified: Secondary | ICD-10-CM

## 2011-02-10 DIAGNOSIS — G4726 Circadian rhythm sleep disorder, shift work type: Secondary | ICD-10-CM | POA: Insufficient documentation

## 2011-02-10 DIAGNOSIS — E119 Type 2 diabetes mellitus without complications: Secondary | ICD-10-CM

## 2011-02-10 DIAGNOSIS — E291 Testicular hypofunction: Secondary | ICD-10-CM

## 2011-02-10 DIAGNOSIS — G4700 Insomnia, unspecified: Secondary | ICD-10-CM

## 2011-02-10 DIAGNOSIS — M25519 Pain in unspecified shoulder: Secondary | ICD-10-CM

## 2011-02-10 DIAGNOSIS — I10 Essential (primary) hypertension: Secondary | ICD-10-CM

## 2011-02-10 DIAGNOSIS — M25511 Pain in right shoulder: Secondary | ICD-10-CM

## 2011-02-10 MED ORDER — VARDENAFIL HCL 20 MG PO TABS: 20.0000 mg | ORAL_TABLET | Freq: Every day | ORAL | Status: DC | PRN

## 2011-02-10 MED ORDER — NAPROXEN 500 MG PO TABS: 500.0000 mg | ORAL_TABLET | Freq: Two times a day (BID) | ORAL | Status: DC

## 2011-02-10 MED ORDER — LOSARTAN POTASSIUM 100 MG PO TABS: 100.0000 mg | ORAL_TABLET | Freq: Every day | ORAL | Status: DC

## 2011-02-10 MED ORDER — METFORMIN HCL 500 MG PO TABS: 500.0000 mg | ORAL_TABLET | Freq: Two times a day (BID) | ORAL | Status: DC

## 2011-02-10 MED ORDER — TESTOSTERONE 50 MG/5GM (1%) TD GEL: 5.0000 g | Freq: Every day | TRANSDERMAL | Status: DC

## 2011-02-10 NOTE — Assessment & Plan Note (Signed)
Compliance discussed

## 2011-02-10 NOTE — Assessment & Plan Note (Signed)
Start Cozaar

## 2011-02-10 NOTE — Progress Notes (Signed)
  Subjective:    Patient ID: John Fisher, male    DOB: 05/05/1956, 55 y.o.   MRN: [id number]  HPI C/o HTN as per UC MD last wk -- BP 180/100 and he was in pain   Review of Systems  Constitutional: Negative for appetite change, fatigue and unexpected weight change.  HENT: Negative for nosebleeds, congestion, sore throat, sneezing, trouble swallowing and neck pain.   Eyes: Negative for itching and visual disturbance.  Respiratory: Negative for cough.   Cardiovascular: Negative for chest pain, palpitations and leg swelling.  Gastrointestinal: Negative for nausea, diarrhea, blood in stool and abdominal distention.  Genitourinary: Negative for frequency and hematuria.  Musculoskeletal: Negative for back pain, joint swelling and gait problem.  Skin: Negative for rash.  Neurological: Negative for dizziness, tremors, speech difficulty and weakness.  Psychiatric/Behavioral: Negative for sleep disturbance, dysphoric mood and agitation. The patient is not nervous/anxious.    Wt Readings from Last 3 Encounters:  02/10/11 227 lb (102.967 kg)  08/22/10 232 lb (105.235 kg)  08/19/10 233 lb (105.688 kg)   BP Readings from Last 3 Encounters:  02/10/11 140/100  08/22/10 130/90  08/19/10 130/80        Objective:   Physical Exam  Constitutional: He is oriented to person, place, and time. He appears well-developed.  HENT:  Mouth/Throat: Oropharynx is clear and moist.  Eyes: Conjunctivae are normal. Pupils are equal, round, and reactive to light.  Neck: Normal range of motion. No JVD present. No thyromegaly present.  Cardiovascular: Normal rate, regular rhythm, normal heart sounds and intact distal pulses.  Exam reveals no gallop and no friction rub.   No murmur heard. Pulmonary/Chest: Effort normal and breath sounds normal. No respiratory distress. He has no wheezes. He has no rales. He exhibits no tenderness.  Abdominal: Soft. Bowel sounds are normal. He exhibits no distension and no mass.  There is no tenderness. There is no rebound and no guarding.  Musculoskeletal: Normal range of motion. He exhibits no edema and no tenderness.  Lymphadenopathy:    He has no cervical adenopathy.  Neurological: He is alert and oriented to person, place, and time. He has normal reflexes. No cranial nerve deficit. He exhibits normal muscle tone. Coordination normal.  Skin: Skin is warm and dry. No rash noted.  Psychiatric: He has a normal mood and affect. His behavior is normal. Judgment and thought content normal.          Assessment & Plan:  DIABETES MELLITUS, TYPE II Compliance discussed  Hypertension - new Start Cozaar  Insomnia Intermezzo  OA  On Rx  ED  On Rx  Hypogonadism  Start Testim  Potential benefits of a long term Testim use as well as potential risks  and complications were explained to the patient and were aknowledged. Labs in 3 mo

## 2011-02-10 NOTE — Patient Instructions (Signed)
Try Intermezzo at bedtime

## 2011-02-10 NOTE — Assessment & Plan Note (Signed)
Intermezzo

## 2011-02-17 ENCOUNTER — Telehealth: Payer: Self-pay | Admitting: *Deleted

## 2011-02-17 MED ORDER — METFORMIN HCL 1000 MG PO TABS: 1000.0000 mg | ORAL_TABLET | Freq: Every day | ORAL | Status: DC

## 2011-02-17 MED ORDER — METFORMIN HCL 1000 MG PO TABS: 1000.0000 mg | ORAL_TABLET | Freq: Two times a day (BID) | ORAL | Status: DC

## 2011-02-17 NOTE — Telephone Encounter (Signed)
[facility] called, Metformin has 2 sets of directions. Second set of directions state 3 in am and 2 in pm. You want pt to take 1500 mg in am and 1000 mg in PM? If yes, I will correct and call pharm.

## 2011-02-17 NOTE — Telephone Encounter (Signed)
I changed it to 1000 mg bid Thx

## 2011-02-17 NOTE — Telephone Encounter (Signed)
Actually you sent in 1000 mg qd # 60. Corrected rx to be 1000 mg bid (called pharm and left mess clarifying rx directions)

## 2011-02-19 NOTE — Telephone Encounter (Signed)
Sorry - 1000 mg po bid Thx

## 2011-02-23 ENCOUNTER — Telehealth: Payer: Self-pay | Admitting: *Deleted

## 2011-02-23 NOTE — Telephone Encounter (Signed)
Spoke w/pt - He is very concerned about taking med for his BP. He does not want to take a med his entire life. He also feels that BP elevation is due to decrease in sleep, increase in salt in diet and long hours at work. I explained that he may not need med his entire life - was safer and better for his body to take med and keep BP down than walk around w/elevated BP. He understands and will start med tomorrow and f/u as directed.

## 2011-02-23 NOTE — Telephone Encounter (Signed)
Agree w/your rec to start Rx. Thx

## 2011-03-04 ENCOUNTER — Telehealth: Payer: Self-pay | Admitting: *Deleted

## 2011-03-04 MED ORDER — AMLODIPINE BESYLATE 5 MG PO TABS: 5.0000 mg | ORAL_TABLET | Freq: Every day | ORAL | Status: DC

## 2011-03-04 NOTE — Telephone Encounter (Signed)
Add amlodipine 1 a day thx

## 2011-03-04 NOTE — Telephone Encounter (Signed)
Pt states that he is taking the new Rx for his BP [1 QD] and still having elevated readings 180s systolic & around 105 diastolic. Pt is NOT having lightheadedness and/or dizziness [except when bending over], NO nausea, NO arm and/or chest pain. Do you want to schedule OV or increase/change medication.?

## 2011-03-06 NOTE — Telephone Encounter (Signed)
Pt advised of additional BP medication

## 2011-04-27 ENCOUNTER — Telehealth: Payer: Self-pay | Admitting: *Deleted

## 2011-04-27 NOTE — Telephone Encounter (Signed)
Pt is req RX for both BP meds to go to mail order OR if there is alternatives to meds that are on 4$ [facility] list.

## 2011-04-29 MED ORDER — LOSARTAN POTASSIUM 100 MG PO TABS: 100.0000 mg | ORAL_TABLET | Freq: Every day | ORAL | Status: DC

## 2011-04-29 MED ORDER — METFORMIN HCL 1000 MG PO TABS: 1000.0000 mg | ORAL_TABLET | Freq: Two times a day (BID) | ORAL | Status: DC

## 2011-04-29 MED ORDER — AMLODIPINE BESYLATE 5 MG PO TABS: 5.0000 mg | ORAL_TABLET | Freq: Every day | ORAL | Status: DC

## 2011-04-29 NOTE — Telephone Encounter (Signed)
Spoke w/pt and wife, Rx's sent to mail order pharm

## 2011-06-03 ENCOUNTER — Encounter: Payer: Self-pay | Admitting: Internal Medicine

## 2011-06-03 ENCOUNTER — Ambulatory Visit (INDEPENDENT_AMBULATORY_CARE_PROVIDER_SITE_OTHER): Payer: 59 | Admitting: Internal Medicine

## 2011-06-03 VITALS — BP 120/72 | HR 84 | Temp 98.2°F | Resp 16 | Wt 230.0 lb

## 2011-06-03 DIAGNOSIS — E538 Deficiency of other specified B group vitamins: Secondary | ICD-10-CM

## 2011-06-03 DIAGNOSIS — I10 Essential (primary) hypertension: Secondary | ICD-10-CM

## 2011-06-03 DIAGNOSIS — R5381 Other malaise: Secondary | ICD-10-CM

## 2011-06-03 DIAGNOSIS — Z23 Encounter for immunization: Secondary | ICD-10-CM

## 2011-06-03 DIAGNOSIS — R5383 Other fatigue: Secondary | ICD-10-CM

## 2011-06-03 DIAGNOSIS — E119 Type 2 diabetes mellitus without complications: Secondary | ICD-10-CM

## 2011-06-03 DIAGNOSIS — E291 Testicular hypofunction: Secondary | ICD-10-CM

## 2011-06-03 MED ORDER — AMLODIPINE BESYLATE 5 MG PO TABS: 5.0000 mg | ORAL_TABLET | Freq: Every day | ORAL | Status: DC

## 2011-06-03 MED ORDER — LOSARTAN POTASSIUM 100 MG PO TABS: 100.0000 mg | ORAL_TABLET | Freq: Every day | ORAL | Status: DC

## 2011-06-03 MED ORDER — METFORMIN HCL 1000 MG PO TABS: 1000.0000 mg | ORAL_TABLET | Freq: Two times a day (BID) | ORAL | Status: DC

## 2011-06-03 MED ORDER — TESTOSTERONE MICRONIZED CRYS: CRYSTALS | Status: DC

## 2011-06-03 NOTE — Assessment & Plan Note (Signed)
Will try a compound cream

## 2011-06-03 NOTE — Assessment & Plan Note (Signed)
Treat hypogonadism 

## 2011-06-03 NOTE — Assessment & Plan Note (Signed)
Continue with current prescription therapy as reflected on the Med list.  

## 2011-06-03 NOTE — Progress Notes (Signed)
  Subjective:    Patient ID: John Fisher, male    DOB: 02-08-1956, 55 y.o.   MRN: [id number]  HPI  The patient presents for a follow-up of  chronic hypogonadism, hypertension, chronic dyslipidemia, type 2 diabetes controlled with medicines    Review of Systems  Constitutional: Positive for fatigue. Negative for appetite change and unexpected weight change.  HENT: Negative for nosebleeds, congestion, sore throat, sneezing, trouble swallowing and neck pain.   Eyes: Negative for itching and visual disturbance.  Respiratory: Negative for cough.   Cardiovascular: Negative for chest pain, palpitations and leg swelling.  Gastrointestinal: Negative for nausea, diarrhea, blood in stool and abdominal distention.  Genitourinary: Negative for frequency and hematuria.  Musculoskeletal: Positive for arthralgias. Negative for back pain, joint swelling and gait problem.  Skin: Negative for rash.  Neurological: Negative for dizziness, tremors, speech difficulty and weakness.  Psychiatric/Behavioral: Negative for suicidal ideas, sleep disturbance, dysphoric mood and agitation. The patient is not nervous/anxious.        Objective:   Physical Exam  Constitutional: He is oriented to person, place, and time. He appears well-developed.  HENT:  Mouth/Throat: Oropharynx is clear and moist.  Eyes: Conjunctivae are normal. Pupils are equal, round, and reactive to light.  Neck: Normal range of motion. No JVD present. No thyromegaly present.  Cardiovascular: Normal rate, regular rhythm, normal heart sounds and intact distal pulses.  Exam reveals no gallop and no friction rub.   No murmur heard. Pulmonary/Chest: Effort normal and breath sounds normal. No respiratory distress. He has no wheezes. He has no rales. He exhibits no tenderness.  Abdominal: Soft. Bowel sounds are normal. He exhibits no distension and no mass. There is no tenderness. There is no rebound and no guarding.  Musculoskeletal: Normal range  of motion. He exhibits no edema and no tenderness.  Lymphadenopathy:    He has no cervical adenopathy.  Neurological: He is alert and oriented to person, place, and time. He has normal reflexes. No cranial nerve deficit. He exhibits normal muscle tone. Coordination normal.  Skin: Skin is warm and dry. No rash noted.  Psychiatric: He has a normal mood and affect. His behavior is normal. Judgment and thought content normal.     Lab Results  Component Value Date   WBC 9.9 08/19/2010   HGB 14.6 08/19/2010   HCT 42.3 08/19/2010   PLT 189.0 08/19/2010   CHOL 209* 12/04/2008   TRIG 197.0* 12/04/2008   HDL 57.90 12/04/2008   LDLDIRECT 112.6 12/04/2008   ALT 25 08/19/2010   AST 21 08/19/2010   NA 145 08/19/2010   K 4.6 08/19/2010   CL 108 08/19/2010   CREATININE 1.1 08/19/2010   BUN 18 08/19/2010   CO2 28 08/19/2010   TSH 1.07 08/19/2010   PSA 0.79 12/04/2008   HGBA1C 6.2 08/19/2010        Assessment & Plan:

## 2011-06-08 NOTE — Assessment & Plan Note (Signed)
Continue with current prescription therapy as reflected on the Med list.  

## 2011-09-10 ENCOUNTER — Ambulatory Visit: Payer: 59 | Admitting: Internal Medicine

## 2011-09-10 DIAGNOSIS — Z0289 Encounter for other administrative examinations: Secondary | ICD-10-CM

## 2011-09-19 ENCOUNTER — Other Ambulatory Visit: Payer: Self-pay | Admitting: Internal Medicine

## 2011-09-21 NOTE — Telephone Encounter (Signed)
Is this ok to refill?..09/21/11@8 :31am/LMB

## 2011-09-25 ENCOUNTER — Telehealth: Payer: Self-pay | Admitting: *Deleted

## 2011-09-25 MED ORDER — TESTOSTERONE 12.5 MG/ACT (1%) TD GEL: 5.0000 g | Freq: Every day | TRANSDERMAL | Status: DC

## 2011-09-25 NOTE — Telephone Encounter (Signed)
OK to fill this prescription with additional refills x5 Thank you!  

## 2011-09-25 NOTE — Telephone Encounter (Signed)
Rf req for Androgel 1% gel. Place 5 gm on to skin daily. Last filled 06-26-11. Ok to Rf?

## 2011-11-25 ENCOUNTER — Encounter: Payer: Self-pay | Admitting: Internal Medicine

## 2011-11-25 ENCOUNTER — Ambulatory Visit (INDEPENDENT_AMBULATORY_CARE_PROVIDER_SITE_OTHER): Payer: 59 | Admitting: Internal Medicine

## 2011-11-25 ENCOUNTER — Other Ambulatory Visit (INDEPENDENT_AMBULATORY_CARE_PROVIDER_SITE_OTHER): Payer: 59

## 2011-11-25 ENCOUNTER — Telehealth: Payer: Self-pay | Admitting: Internal Medicine

## 2011-11-25 VITALS — BP 118/88 | HR 80 | Temp 98.7°F | Resp 16 | Wt 220.0 lb

## 2011-11-25 DIAGNOSIS — E559 Vitamin D deficiency, unspecified: Secondary | ICD-10-CM

## 2011-11-25 DIAGNOSIS — E291 Testicular hypofunction: Secondary | ICD-10-CM

## 2011-11-25 DIAGNOSIS — M25519 Pain in unspecified shoulder: Secondary | ICD-10-CM

## 2011-11-25 DIAGNOSIS — M25511 Pain in right shoulder: Secondary | ICD-10-CM

## 2011-11-25 DIAGNOSIS — I10 Essential (primary) hypertension: Secondary | ICD-10-CM

## 2011-11-25 DIAGNOSIS — E538 Deficiency of other specified B group vitamins: Secondary | ICD-10-CM

## 2011-11-25 DIAGNOSIS — E119 Type 2 diabetes mellitus without complications: Secondary | ICD-10-CM

## 2011-11-25 DIAGNOSIS — N529 Male erectile dysfunction, unspecified: Secondary | ICD-10-CM

## 2011-11-25 LAB — COMPREHENSIVE METABOLIC PANEL
AST: 21 U/L (ref 0–37)
Albumin: 4.1 g/dL (ref 3.5–5.2)
Alkaline Phosphatase: 56 U/L (ref 39–117)
Chloride: 101 mEq/L (ref 96–112)
Potassium: 4.9 mEq/L (ref 3.5–5.1)
Sodium: 141 mEq/L (ref 135–145)
Total Protein: 7.2 g/dL (ref 6.0–8.3)

## 2011-11-25 LAB — HEMOGLOBIN A1C: Hgb A1c MFr Bld: 6.1 % (ref 4.6–6.5)

## 2011-11-25 LAB — CBC WITH DIFFERENTIAL/PLATELET
Basophils Absolute: 0 10*3/uL (ref 0.0–0.1)
Eosinophils Absolute: 0.1 10*3/uL (ref 0.0–0.7)
MCHC: 33.8 g/dL (ref 30.0–36.0)
MCV: 94.8 fl (ref 78.0–100.0)
Monocytes Absolute: 0.5 10*3/uL (ref 0.1–1.0)
Neutrophils Relative %: 72.2 % (ref 43.0–77.0)
Platelets: 202 10*3/uL (ref 150.0–400.0)
RDW: 14 % (ref 11.5–14.6)

## 2011-11-25 LAB — SEDIMENTATION RATE: Sed Rate: 11 mm/hr (ref 0–22)

## 2011-11-25 LAB — TESTOSTERONE: Testosterone: 148.33 ng/dL — ABNORMAL LOW (ref 350.00–890.00)

## 2011-11-25 MED ORDER — TESTOSTERONE 20.25 MG/ACT (1.62%) TD GEL: 2.0000 | TRANSDERMAL | Status: DC

## 2011-11-25 MED ORDER — VARDENAFIL HCL 20 MG PO TABS: 20.0000 mg | ORAL_TABLET | Freq: Every day | ORAL | Status: DC | PRN

## 2011-11-25 NOTE — Assessment & Plan Note (Signed)
2011 3/13 back on Androgel  Potential benefits of a long term testosterone  use as well as potential risks  and complications were explained to the patient and were aknowledged. Labs in 3 mo

## 2011-11-25 NOTE — Assessment & Plan Note (Signed)
E-startcurrent prescription therapy as reflected on the Med list.

## 2011-11-25 NOTE — Assessment & Plan Note (Signed)
Continue with current prn prescription therapy as reflected on the Med list.  

## 2011-11-25 NOTE — Assessment & Plan Note (Signed)
Restart

## 2011-11-25 NOTE — Assessment & Plan Note (Signed)
Continue with current prescription therapy as reflected on the Med list. Labs  

## 2011-11-25 NOTE — Assessment & Plan Note (Signed)
Continue with current prescription therapy as reflected on the Med list.  

## 2011-11-25 NOTE — Progress Notes (Signed)
Patient ID: John Fisher, male   DOB: 1955/10/29, 56 y.o.   MRN: [id number]  Subjective:    Patient ID: John Fisher, male    DOB: [birth year], 56 y.o.   MRN: [id number]  HPI  The patient presents for a follow-up of  chronic knee OA, hypogonadism, hypertension, chronic dyslipidemia, type 2 diabetes controlled with medicines  Wt Readings from Last 3 Encounters:  11/25/11 220 lb (99.791 kg)  06/03/11 230 lb (104.327 kg)  02/10/11 227 lb (102.967 kg)   BP Readings from Last 3 Encounters:  11/25/11 118/88  06/03/11 120/72  02/10/11 140/100        Review of Systems  Constitutional: Positive for fatigue. Negative for appetite change and unexpected weight change.  HENT: Negative for nosebleeds, congestion, sore throat, sneezing, trouble swallowing and neck pain.   Eyes: Negative for itching and visual disturbance.  Respiratory: Negative for cough.   Cardiovascular: Negative for chest pain, palpitations and leg swelling.  Gastrointestinal: Negative for nausea, diarrhea, blood in stool and abdominal distention.  Genitourinary: Negative for frequency and hematuria.  Musculoskeletal: Positive for arthralgias. Negative for back pain, joint swelling and gait problem.  Skin: Negative for rash.  Neurological: Negative for dizziness, tremors, speech difficulty and weakness.  Psychiatric/Behavioral: Negative for suicidal ideas, sleep disturbance, dysphoric mood and agitation. The patient is not nervous/anxious.        Objective:   Physical Exam  Constitutional: He is oriented to person, place, and time. He appears well-developed.  HENT:  Mouth/Throat: Oropharynx is clear and moist.  Eyes: Conjunctivae are normal. Pupils are equal, round, and reactive to light.  Neck: Normal range of motion. No JVD present. No thyromegaly present.  Cardiovascular: Normal rate, regular rhythm, normal heart sounds and intact distal pulses.  Exam reveals no gallop and no friction rub.   No murmur  heard. Pulmonary/Chest: Effort normal and breath sounds normal. No respiratory distress. He has no wheezes. He has no rales. He exhibits no tenderness.  Abdominal: Soft. Bowel sounds are normal. He exhibits no distension and no mass. There is no tenderness. There is no rebound and no guarding.  Musculoskeletal: Normal range of motion. He exhibits no edema and no tenderness.  Lymphadenopathy:    He has no cervical adenopathy.  Neurological: He is alert and oriented to person, place, and time. He has normal reflexes. No cranial nerve deficit. He exhibits normal muscle tone. Coordination normal.  Skin: Skin is warm and dry. No rash noted.  Psychiatric: He has a normal mood and affect. His behavior is normal. Judgment and thought content normal.     Lab Results  Component Value Date   WBC 9.9 08/19/2010   HGB 14.6 08/19/2010   HCT 42.3 08/19/2010   PLT 189.0 08/19/2010   CHOL 209* 12/04/2008   TRIG 197.0* 12/04/2008   HDL 57.90 12/04/2008   LDLDIRECT 112.6 12/04/2008   ALT 25 08/19/2010   AST 21 08/19/2010   NA 145 08/19/2010   K 4.6 08/19/2010   CL 108 08/19/2010   CREATININE 1.1 08/19/2010   BUN 18 08/19/2010   CO2 28 08/19/2010   TSH 1.07 08/19/2010   PSA 0.79 12/04/2008   HGBA1C 6.2 08/19/2010        Assessment & Plan:

## 2011-11-25 NOTE — Telephone Encounter (Signed)
John Fisher, please, inform patient that all labs are normal except for low testost; A1c was 6.1 -- nl Cont Rx - restart vitamins  Please, mail the labs to the patient. Thx

## 2011-11-26 LAB — VITAMIN D 25 HYDROXY (VIT D DEFICIENCY, FRACTURES): Vit D, 25-Hydroxy: 18 ng/mL — ABNORMAL LOW (ref 30–89)

## 2011-11-26 NOTE — Telephone Encounter (Signed)
Attempted to reach pt; home # not in service, left message on cell # to return my call.

## 2011-11-26 NOTE — Telephone Encounter (Signed)
Left mess for patient to call back.  

## 2011-11-27 NOTE — Telephone Encounter (Signed)
Pt notified and labs mailed 

## 2012-01-08 ENCOUNTER — Ambulatory Visit: Payer: 59 | Admitting: Internal Medicine

## 2012-01-11 ENCOUNTER — Other Ambulatory Visit: Payer: Self-pay | Admitting: Internal Medicine

## 2012-02-04 ENCOUNTER — Telehealth: Payer: Self-pay | Admitting: *Deleted

## 2012-02-04 NOTE — Telephone Encounter (Signed)
Message copied by Merrilyn Puma on Thu Feb 04, 2012  8:16 AM ------      Message from: COUSIN, SHARON T      Created: Mon Feb 01, 2012  9:37 AM      Regarding: CONCERN CHOLESTEROL MED ALSO       Misty Stanley,  Concerned [facility] cholesterol med and insurance.  Please add this to the first staff message I sent you   Thanks

## 2012-02-04 NOTE — Telephone Encounter (Signed)
I called pt- he states he has what he needs now for his ins. He was requesting his labs. Closing phone note.

## 2012-02-26 ENCOUNTER — Telehealth: Payer: Self-pay

## 2012-02-26 ENCOUNTER — Ambulatory Visit: Payer: 59 | Admitting: Internal Medicine

## 2012-02-26 DIAGNOSIS — Z0289 Encounter for other administrative examinations: Secondary | ICD-10-CM

## 2012-02-26 NOTE — Telephone Encounter (Signed)
Ok to change Thx 

## 2012-02-26 NOTE — Telephone Encounter (Signed)
Pt called requesting a new Rx to [facility] [address] for Androgel pump, gel packets are too expensive. Please advise.

## 2012-02-29 MED ORDER — TESTOSTERONE 20.25 MG/ACT (1.62%) TD GEL: 2.0000 | TRANSDERMAL | Status: DC

## 2012-02-29 NOTE — Telephone Encounter (Signed)
Done. Left detailed mess informing pt.  

## 2012-05-02 ENCOUNTER — Other Ambulatory Visit: Payer: Self-pay | Admitting: *Deleted

## 2012-05-02 MED ORDER — AMLODIPINE BESYLATE 5 MG PO TABS: 5.0000 mg | ORAL_TABLET | Freq: Every day | ORAL | Status: DC

## 2012-05-02 MED ORDER — LOSARTAN POTASSIUM 100 MG PO TABS: 100.0000 mg | ORAL_TABLET | Freq: Every day | ORAL | Status: DC

## 2012-05-03 ENCOUNTER — Other Ambulatory Visit: Payer: Self-pay

## 2012-05-03 MED ORDER — LOSARTAN POTASSIUM 100 MG PO TABS: 100.0000 mg | ORAL_TABLET | Freq: Every day | ORAL | Status: DC

## 2012-05-03 MED ORDER — AMLODIPINE BESYLATE 5 MG PO TABS: 5.0000 mg | ORAL_TABLET | Freq: Every day | ORAL | Status: DC

## 2012-05-30 ENCOUNTER — Other Ambulatory Visit: Payer: Self-pay | Admitting: Internal Medicine

## 2012-06-06 ENCOUNTER — Ambulatory Visit: Payer: 59 | Admitting: Internal Medicine

## 2012-06-06 DIAGNOSIS — Z0289 Encounter for other administrative examinations: Secondary | ICD-10-CM

## 2012-09-17 ENCOUNTER — Other Ambulatory Visit: Payer: Self-pay | Admitting: Internal Medicine

## 2013-01-07 ENCOUNTER — Other Ambulatory Visit: Payer: Self-pay | Admitting: Internal Medicine

## 2013-01-11 ENCOUNTER — Telehealth: Payer: Self-pay | Admitting: *Deleted

## 2013-01-11 ENCOUNTER — Other Ambulatory Visit: Payer: Self-pay | Admitting: *Deleted

## 2013-01-11 DIAGNOSIS — Z0000 Encounter for general adult medical examination without abnormal findings: Secondary | ICD-10-CM

## 2013-01-11 DIAGNOSIS — Z0389 Encounter for observation for other suspected diseases and conditions ruled out: Secondary | ICD-10-CM

## 2013-01-11 MED ORDER — AMLODIPINE BESYLATE 5 MG PO TABS: 5.0000 mg | ORAL_TABLET | Freq: Every day | ORAL | Status: DC

## 2013-01-11 MED ORDER — LOSARTAN POTASSIUM 100 MG PO TABS: 100.0000 mg | ORAL_TABLET | Freq: Every day | ORAL | Status: DC

## 2013-01-11 NOTE — Telephone Encounter (Signed)
Labs entered.

## 2013-01-11 NOTE — Telephone Encounter (Signed)
Message copied by Merrilyn Puma on Wed Jan 11, 2013  2:10 PM ------      Message from: Livingston Diones      Created: Wed Jan 11, 2013 10:55 AM       Pt has a CPE on 02/13/13. Please put in lab order a week prior            Thanks      Tomas de Castro ------

## 2013-01-20 ENCOUNTER — Telehealth: Payer: Self-pay | Admitting: Internal Medicine

## 2013-01-20 NOTE — Telephone Encounter (Signed)
Pt is requesting the cream for his face to be called into [facility].  He has an appt for a physical on June 2 with labs prior.

## 2013-01-23 MED ORDER — CLOTRIMAZOLE-BETAMETHASONE 1-0.05 % EX CREA: TOPICAL_CREAM | Freq: Two times a day (BID) | CUTANEOUS | Status: DC

## 2013-01-23 NOTE — Telephone Encounter (Signed)
Rx sent 

## 2013-01-26 ENCOUNTER — Telehealth: Payer: Self-pay | Admitting: *Deleted

## 2013-01-26 MED ORDER — CLOTRIMAZOLE-BETAMETHASONE 1-0.05 % EX CREA: TOPICAL_CREAM | Freq: Two times a day (BID) | CUTANEOUS | Status: DC

## 2013-01-26 NOTE — Telephone Encounter (Signed)
Left msg on triage pharmacy never receive refill on medication. Requesting call back. Called pt back verify which med he states it was crem for his face. Inform him md [facility]. Will res[facility] cream [facility]...Raechel Chute

## 2013-02-13 ENCOUNTER — Encounter: Payer: 59 | Admitting: Internal Medicine

## 2013-02-15 ENCOUNTER — Ambulatory Visit (INDEPENDENT_AMBULATORY_CARE_PROVIDER_SITE_OTHER): Payer: 59

## 2013-02-15 DIAGNOSIS — Z0000 Encounter for general adult medical examination without abnormal findings: Secondary | ICD-10-CM

## 2013-02-15 DIAGNOSIS — Z0389 Encounter for observation for other suspected diseases and conditions ruled out: Secondary | ICD-10-CM

## 2013-02-15 LAB — URINALYSIS, ROUTINE W REFLEX MICROSCOPIC
Leukocytes, UA: NEGATIVE
Specific Gravity, Urine: 1.025 (ref 1.000–1.030)
Urobilinogen, UA: 0.2 (ref 0.0–1.0)

## 2013-02-15 LAB — CBC WITH DIFFERENTIAL/PLATELET
Eosinophils Relative: 2 % (ref 0.0–5.0)
HCT: 42.2 % (ref 39.0–52.0)
Hemoglobin: 14.5 g/dL (ref 13.0–17.0)
Lymphs Abs: 2.8 10*3/uL (ref 0.7–4.0)
MCV: 96.4 fl (ref 78.0–100.0)
Monocytes Absolute: 0.6 10*3/uL (ref 0.1–1.0)
Monocytes Relative: 6 % (ref 3.0–12.0)
Neutro Abs: 6.1 10*3/uL (ref 1.4–7.7)
Platelets: 213 10*3/uL (ref 150.0–400.0)
WBC: 9.7 10*3/uL (ref 4.5–10.5)

## 2013-02-15 LAB — HEPATIC FUNCTION PANEL
ALT: 29 U/L (ref 0–53)
Albumin: 4 g/dL (ref 3.5–5.2)
Total Protein: 7.4 g/dL (ref 6.0–8.3)

## 2013-02-15 LAB — LIPID PANEL
Total CHOL/HDL Ratio: 3
VLDL: 41.8 mg/dL — ABNORMAL HIGH (ref 0.0–40.0)

## 2013-02-15 LAB — BASIC METABOLIC PANEL
BUN: 17 mg/dL (ref 6–23)
CO2: 27 mEq/L (ref 19–32)
Calcium: 10.3 mg/dL (ref 8.4–10.5)
Creatinine, Ser: 1.4 mg/dL (ref 0.4–1.5)
Glucose, Bld: 121 mg/dL — ABNORMAL HIGH (ref 70–99)

## 2013-02-15 LAB — TSH: TSH: 0.92 u[IU]/mL (ref 0.35–5.50)

## 2013-02-22 ENCOUNTER — Encounter: Payer: Self-pay | Admitting: Internal Medicine

## 2013-02-22 ENCOUNTER — Ambulatory Visit (INDEPENDENT_AMBULATORY_CARE_PROVIDER_SITE_OTHER): Payer: 59 | Admitting: Internal Medicine

## 2013-02-22 VITALS — BP 120/78 | HR 81 | Temp 98.1°F | Resp 16 | Wt 223.0 lb

## 2013-02-22 DIAGNOSIS — E119 Type 2 diabetes mellitus without complications: Secondary | ICD-10-CM

## 2013-02-22 DIAGNOSIS — I10 Essential (primary) hypertension: Secondary | ICD-10-CM

## 2013-02-22 DIAGNOSIS — E538 Deficiency of other specified B group vitamins: Secondary | ICD-10-CM

## 2013-02-22 NOTE — Assessment & Plan Note (Signed)
Continue with current prescription therapy as reflected on the Med list.  

## 2013-02-22 NOTE — Progress Notes (Signed)
  Subjective:    HPI  The patient is here for a wellness exam. The patient has been doing well overall without major physical or psychological issues going on lately, except for dry mouth at night and dizziness  The patient presents for a follow-up of  chronic knee OA, hypogonadism, hypertension, chronic dyslipidemia, type 2 diabetes controlled with medicines  Wt Readings from Last 3 Encounters:  02/22/13 223 lb (101.152 kg)  11/25/11 220 lb (99.791 kg)  06/03/11 230 lb (104.327 kg)   BP Readings from Last 3 Encounters:  02/22/13 120/78  11/25/11 118/88  06/03/11 120/72        Review of Systems  Constitutional: Positive for fatigue. Negative for appetite change and unexpected weight change.  HENT: Negative for nosebleeds, congestion, sore throat, sneezing, trouble swallowing and neck pain.   Eyes: Negative for itching and visual disturbance.  Respiratory: Negative for cough.   Cardiovascular: Negative for chest pain, palpitations and leg swelling.  Gastrointestinal: Negative for nausea, diarrhea, blood in stool and abdominal distention.  Genitourinary: Negative for frequency and hematuria.  Musculoskeletal: Positive for arthralgias. Negative for back pain, joint swelling and gait problem.  Skin: Negative for rash.  Neurological: Negative for dizziness, tremors, speech difficulty and weakness.  Psychiatric/Behavioral: Negative for suicidal ideas, sleep disturbance, dysphoric mood and agitation. The patient is not nervous/anxious.        Objective:   Physical Exam  Constitutional: He is oriented to person, place, and time. He appears well-developed.  HENT:  Mouth/Throat: Oropharynx is clear and moist.  Eyes: Conjunctivae are normal. Pupils are equal, round, and reactive to light.  Neck: Normal range of motion. No JVD present. No thyromegaly present.  Cardiovascular: Normal rate, regular rhythm, normal heart sounds and intact distal pulses.  Exam reveals no gallop and no  friction rub.   No murmur heard. Pulmonary/Chest: Effort normal and breath sounds normal. No respiratory distress. He has no wheezes. He has no rales. He exhibits no tenderness.  Abdominal: Soft. Bowel sounds are normal. He exhibits no distension and no mass. There is no tenderness. There is no rebound and no guarding.  Musculoskeletal: Normal range of motion. He exhibits no edema and no tenderness.  Lymphadenopathy:    He has no cervical adenopathy.  Neurological: He is alert and oriented to person, place, and time. He has normal reflexes. No cranial nerve deficit. He exhibits normal muscle tone. Coordination normal.  Skin: Skin is warm and dry. No rash noted.  Psychiatric: He has a normal mood and affect. His behavior is normal. Judgment and thought content normal.     Lab Results  Component Value Date   WBC 9.7 02/15/2013   HGB 14.5 02/15/2013   HCT 42.2 02/15/2013   PLT 213.0 02/15/2013   CHOL 166 02/15/2013   TRIG 209.0* 02/15/2013   HDL 58.20 02/15/2013   LDLDIRECT 71.3 02/15/2013   ALT 29 02/15/2013   AST 20 02/15/2013   NA 144 02/15/2013   K 5.2* 02/15/2013   CL 108 02/15/2013   CREATININE 1.4 02/15/2013   BUN 17 02/15/2013   CO2 27 02/15/2013   TSH 0.92 02/15/2013   PSA 1.12 02/15/2013   HGBA1C 6.1 11/25/2011        Assessment & Plan:

## 2013-02-22 NOTE — Patient Instructions (Signed)
Cut back on salt Lemon in water

## 2013-03-23 ENCOUNTER — Other Ambulatory Visit: Payer: Self-pay

## 2013-04-08 ENCOUNTER — Other Ambulatory Visit: Payer: Self-pay | Admitting: Internal Medicine

## 2013-05-18 ENCOUNTER — Encounter: Payer: Self-pay | Admitting: Internal Medicine

## 2013-05-18 ENCOUNTER — Ambulatory Visit (INDEPENDENT_AMBULATORY_CARE_PROVIDER_SITE_OTHER): Payer: 59 | Admitting: Internal Medicine

## 2013-05-18 VITALS — BP 130/90 | HR 80 | Temp 98.1°F | Resp 16 | Wt 215.0 lb

## 2013-05-18 DIAGNOSIS — E785 Hyperlipidemia, unspecified: Secondary | ICD-10-CM

## 2013-05-18 DIAGNOSIS — E559 Vitamin D deficiency, unspecified: Secondary | ICD-10-CM

## 2013-05-18 DIAGNOSIS — I10 Essential (primary) hypertension: Secondary | ICD-10-CM

## 2013-05-18 DIAGNOSIS — E538 Deficiency of other specified B group vitamins: Secondary | ICD-10-CM

## 2013-05-18 DIAGNOSIS — R7309 Other abnormal glucose: Secondary | ICD-10-CM

## 2013-05-18 DIAGNOSIS — E119 Type 2 diabetes mellitus without complications: Secondary | ICD-10-CM

## 2013-05-18 DIAGNOSIS — Z23 Encounter for immunization: Secondary | ICD-10-CM

## 2013-05-18 MED ORDER — ATORVASTATIN CALCIUM 10 MG PO TABS: 10.0000 mg | ORAL_TABLET | Freq: Every day | ORAL | Status: DC

## 2013-05-18 NOTE — Assessment & Plan Note (Signed)
Continue with current prescription therapy as reflected on the Med list.  

## 2013-05-18 NOTE — Assessment & Plan Note (Signed)
Cont w/wt loss  We can try Lipitor 10 mg/d

## 2013-05-18 NOTE — Progress Notes (Signed)
Patient ID: John Fisher, male   DOB: 25-Aug-1956, 57 y.o.   MRN: [id number]  Subjective:    HPI  The patient is here for a wellness exam. The patient has been doing well overall without major physical or psychological issues going on lately, except for dry mouth at night and dizziness  The patient presents for a follow-up of  chronic knee OA, hypogonadism, hypertension, chronic dyslipidemia, type 2 diabetes controlled with medicines  Wt Readings from Last 3 Encounters:  05/18/13 215 lb (97.523 kg)  02/22/13 223 lb (101.152 kg)  11/25/11 220 lb (99.791 kg)   BP Readings from Last 3 Encounters:  05/18/13 130/90  02/22/13 120/78  11/25/11 118/88        Review of Systems  Constitutional: Positive for fatigue. Negative for appetite change and unexpected weight change.  HENT: Negative for nosebleeds, congestion, sore throat, sneezing, trouble swallowing and neck pain.   Eyes: Negative for itching and visual disturbance.  Respiratory: Negative for cough.   Cardiovascular: Negative for chest pain, palpitations and leg swelling.  Gastrointestinal: Negative for nausea, diarrhea, blood in stool and abdominal distention.  Genitourinary: Negative for frequency and hematuria.  Musculoskeletal: Positive for arthralgias. Negative for back pain, joint swelling and gait problem.  Skin: Negative for rash.  Neurological: Negative for dizziness, tremors, speech difficulty and weakness.  Psychiatric/Behavioral: Negative for suicidal ideas, sleep disturbance, dysphoric mood and agitation. The patient is not nervous/anxious.        Objective:   Physical Exam  Constitutional: He is oriented to person, place, and time. He appears well-developed.  HENT:  Mouth/Throat: Oropharynx is clear and moist.  Eyes: Conjunctivae are normal. Pupils are equal, round, and reactive to light.  Neck: Normal range of motion. No JVD present. No thyromegaly present.  Cardiovascular: Normal rate, regular rhythm,  normal heart sounds and intact distal pulses.  Exam reveals no gallop and no friction rub.   No murmur heard. Pulmonary/Chest: Effort normal and breath sounds normal. No respiratory distress. He has no wheezes. He has no rales. He exhibits no tenderness.  Abdominal: Soft. Bowel sounds are normal. He exhibits no distension and no mass. There is no tenderness. There is no rebound and no guarding.  Musculoskeletal: Normal range of motion. He exhibits no edema and no tenderness.  Lymphadenopathy:    He has no cervical adenopathy.  Neurological: He is alert and oriented to person, place, and time. He has normal reflexes. No cranial nerve deficit. He exhibits normal muscle tone. Coordination normal.  Skin: Skin is warm and dry. No rash noted.  Psychiatric: He has a normal mood and affect. His behavior is normal. Judgment and thought content normal.     Lab Results  Component Value Date   WBC 9.7 02/15/2013   HGB 14.5 02/15/2013   HCT 42.2 02/15/2013   PLT 213.0 02/15/2013   CHOL 166 02/15/2013   TRIG 209.0* 02/15/2013   HDL 58.20 02/15/2013   LDLDIRECT 71.3 02/15/2013   ALT 29 02/15/2013   AST 20 02/15/2013   NA 144 02/15/2013   K 5.2* 02/15/2013   CL 108 02/15/2013   CREATININE 1.4 02/15/2013   BUN 17 02/15/2013   CO2 27 02/15/2013   TSH 0.92 02/15/2013   PSA 1.12 02/15/2013   HGBA1C 6.1 11/25/2011        Assessment & Plan:

## 2013-06-23 ENCOUNTER — Ambulatory Visit: Payer: 59 | Admitting: Internal Medicine

## 2013-09-22 ENCOUNTER — Ambulatory Visit: Payer: 59 | Admitting: Internal Medicine

## 2013-09-22 DIAGNOSIS — Z0289 Encounter for other administrative examinations: Secondary | ICD-10-CM

## 2013-09-27 ENCOUNTER — Other Ambulatory Visit: Payer: Self-pay | Admitting: Internal Medicine

## 2013-09-27 NOTE — Telephone Encounter (Signed)
Notified patient meds sent to pharmacy.  

## 2014-03-14 ENCOUNTER — Encounter: Payer: Self-pay | Admitting: *Deleted

## 2014-03-14 ENCOUNTER — Telehealth: Payer: Self-pay | Admitting: *Deleted

## 2014-03-14 DIAGNOSIS — E119 Type 2 diabetes mellitus without complications: Secondary | ICD-10-CM

## 2014-03-14 NOTE — Telephone Encounter (Signed)
Unable to reach patient. Letter was mailed to home address.  Lipid and a1c ordered.

## 2014-04-20 ENCOUNTER — Telehealth: Payer: Self-pay

## 2014-04-20 NOTE — Telephone Encounter (Signed)
Pt stated that they are having insurance issues and that pt would call back.

## 2014-04-30 ENCOUNTER — Ambulatory Visit: Payer: Non-veteran care | Attending: Adult Health | Admitting: Physical Therapy

## 2014-04-30 DIAGNOSIS — R262 Difficulty in walking, not elsewhere classified: Secondary | ICD-10-CM | POA: Insufficient documentation

## 2014-04-30 DIAGNOSIS — R609 Edema, unspecified: Secondary | ICD-10-CM | POA: Diagnosis not present

## 2014-04-30 DIAGNOSIS — M25669 Stiffness of unspecified knee, not elsewhere classified: Secondary | ICD-10-CM | POA: Diagnosis not present

## 2014-04-30 DIAGNOSIS — M25569 Pain in unspecified knee: Secondary | ICD-10-CM | POA: Diagnosis present

## 2014-05-04 ENCOUNTER — Ambulatory Visit: Payer: Non-veteran care | Admitting: Physical Therapy

## 2015-08-07 ENCOUNTER — Encounter (HOSPITAL_COMMUNITY): Payer: Self-pay | Admitting: *Deleted

## 2015-08-07 ENCOUNTER — Observation Stay (HOSPITAL_COMMUNITY)
Admission: EM | Admit: 2015-08-07 | Discharge: 2015-08-08 | Disposition: A | Discharge status: Home / Self Care | Payer: Non-veteran care | Attending: Internal Medicine | Admitting: Internal Medicine

## 2015-08-07 ENCOUNTER — Emergency Department (HOSPITAL_COMMUNITY): Payer: Non-veteran care

## 2015-08-07 DIAGNOSIS — N289 Disorder of kidney and ureter, unspecified: Secondary | ICD-10-CM | POA: Diagnosis not present

## 2015-08-07 DIAGNOSIS — E1165 Type 2 diabetes mellitus with hyperglycemia: Secondary | ICD-10-CM | POA: Diagnosis present

## 2015-08-07 DIAGNOSIS — Z96651 Presence of right artificial knee joint: Secondary | ICD-10-CM | POA: Diagnosis not present

## 2015-08-07 DIAGNOSIS — Z79899 Other long term (current) drug therapy: Secondary | ICD-10-CM | POA: Diagnosis not present

## 2015-08-07 DIAGNOSIS — M1990 Unspecified osteoarthritis, unspecified site: Secondary | ICD-10-CM | POA: Diagnosis not present

## 2015-08-07 DIAGNOSIS — E860 Dehydration: Secondary | ICD-10-CM | POA: Diagnosis not present

## 2015-08-07 DIAGNOSIS — I10 Essential (primary) hypertension: Secondary | ICD-10-CM | POA: Diagnosis not present

## 2015-08-07 DIAGNOSIS — E785 Hyperlipidemia, unspecified: Secondary | ICD-10-CM | POA: Diagnosis not present

## 2015-08-07 DIAGNOSIS — R358 Other polyuria: Secondary | ICD-10-CM | POA: Diagnosis not present

## 2015-08-07 DIAGNOSIS — N401 Enlarged prostate with lower urinary tract symptoms: Secondary | ICD-10-CM

## 2015-08-07 DIAGNOSIS — IMO0002 Reserved for concepts with insufficient information to code with codable children: Secondary | ICD-10-CM | POA: Diagnosis present

## 2015-08-07 DIAGNOSIS — R944 Abnormal results of kidney function studies: Secondary | ICD-10-CM | POA: Diagnosis not present

## 2015-08-07 DIAGNOSIS — Z7984 Long term (current) use of oral hypoglycemic drugs: Secondary | ICD-10-CM | POA: Insufficient documentation

## 2015-08-07 DIAGNOSIS — N400 Enlarged prostate without lower urinary tract symptoms: Secondary | ICD-10-CM

## 2015-08-07 DIAGNOSIS — E119 Type 2 diabetes mellitus without complications: Secondary | ICD-10-CM | POA: Diagnosis present

## 2015-08-07 DIAGNOSIS — L299 Pruritus, unspecified: Secondary | ICD-10-CM

## 2015-08-07 DIAGNOSIS — R55 Syncope and collapse: Secondary | ICD-10-CM | POA: Diagnosis not present

## 2015-08-07 LAB — URINALYSIS, ROUTINE W REFLEX MICROSCOPIC
Bilirubin Urine: NEGATIVE
Glucose, UA: >1000 mg/dL — AB
Hgb urine dipstick: NEGATIVE
Ketones, ur: 15 mg/dL — AB
LEUKOCYTES UA: NEGATIVE
NITRITE: NEGATIVE
PH: 5 (ref 5.0–8.0)
Protein, ur: 30 mg/dL — AB
SPECIFIC GRAVITY, URINE: 1.021 (ref 1.005–1.030)

## 2015-08-07 LAB — URINE MICROSCOPIC-ADD ON: RBC / HPF: NONE SEEN RBC/hpf (ref 0–5)

## 2015-08-07 LAB — BASIC METABOLIC PANEL
ANION GAP: 13 (ref 5–15)
BUN: 16 mg/dL (ref 6–20)
CHLORIDE: 106 mmol/L (ref 101–111)
CO2: 21 mmol/L — AB (ref 22–32)
Calcium: 8.9 mg/dL (ref 8.9–10.3)
Creatinine, Ser: 1.97 mg/dL — ABNORMAL HIGH (ref 0.61–1.24)
GFR calc non Af Amer: 35 mL/min — ABNORMAL LOW (ref >60)
GFR, EST AFRICAN AMERICAN: 41 mL/min — AB (ref >60)
GLUCOSE: 224 mg/dL — AB (ref 65–99)
POTASSIUM: 3.9 mmol/L (ref 3.5–5.1)
Sodium: 140 mmol/L (ref 135–145)

## 2015-08-07 LAB — CBC WITH DIFFERENTIAL/PLATELET
BASOS ABS: 0 10*3/uL (ref 0.0–0.1)
Basophils Relative: 0 %
Eosinophils Absolute: 0.2 10*3/uL (ref 0.0–0.7)
Eosinophils Relative: 2 %
HEMATOCRIT: 38.7 % — AB (ref 39.0–52.0)
HEMOGLOBIN: 12.8 g/dL — AB (ref 13.0–17.0)
LYMPHS PCT: 26 %
Lymphs Abs: 2.7 10*3/uL (ref 0.7–4.0)
MCH: 30.8 pg (ref 26.0–34.0)
MCHC: 33.1 g/dL (ref 30.0–36.0)
MCV: 93.3 fL (ref 78.0–100.0)
MONO ABS: 0.5 10*3/uL (ref 0.1–1.0)
Monocytes Relative: 4 %
NEUTROS ABS: 7.1 10*3/uL (ref 1.7–7.7)
NEUTROS PCT: 68 %
Platelets: 192 10*3/uL (ref 150–400)
RBC: 4.15 MIL/uL — AB (ref 4.22–5.81)
RDW: 12.7 % (ref 11.5–15.5)
WBC: 10.4 10*3/uL (ref 4.0–10.5)

## 2015-08-07 LAB — GLUCOSE, CAPILLARY: Glucose-Capillary: 254 mg/dL — ABNORMAL HIGH (ref 65–99)

## 2015-08-07 LAB — I-STAT TROPONIN, ED: Troponin i, poc: 0.01 ng/mL (ref 0.00–0.08)

## 2015-08-07 MED ORDER — ACETAMINOPHEN 325 MG PO TABS: 650.0000 mg | ORAL_TABLET | Freq: Four times a day (QID) | ORAL | Status: DC | PRN

## 2015-08-07 MED ORDER — SODIUM CHLORIDE 0.9 % IV BOLUS (SEPSIS)
1000.0000 mL | Freq: Once | INTRAVENOUS | Status: AC
  Administered 2015-08-07: 1000 mL via INTRAVENOUS

## 2015-08-07 MED ORDER — ACETAMINOPHEN 650 MG RE SUPP: 650.0000 mg | Freq: Four times a day (QID) | RECTAL | Status: DC | PRN

## 2015-08-07 MED ORDER — ONDANSETRON HCL 4 MG/2ML IJ SOLN: 4.0000 mg | Freq: Four times a day (QID) | INTRAMUSCULAR | Status: DC | PRN

## 2015-08-07 MED ORDER — INSULIN ASPART 100 UNIT/ML ~~LOC~~ SOLN: 0.0000 [IU] | Freq: Every day | SUBCUTANEOUS | Status: DC

## 2015-08-07 MED ORDER — SENNOSIDES-DOCUSATE SODIUM 8.6-50 MG PO TABS: 1.0000 | ORAL_TABLET | Freq: Every evening | ORAL | Status: DC | PRN

## 2015-08-07 MED ORDER — ATORVASTATIN CALCIUM 10 MG PO TABS
10.0000 mg | ORAL_TABLET | Freq: Every day | ORAL | Status: DC
  Administered 2015-08-07: 10 mg via ORAL
  Filled 2015-08-07: qty 1

## 2015-08-07 MED ORDER — ONDANSETRON HCL 4 MG PO TABS: 4.0000 mg | ORAL_TABLET | Freq: Four times a day (QID) | ORAL | Status: DC | PRN

## 2015-08-07 MED ORDER — IBUPROFEN 600 MG PO TABS
600.0000 mg | ORAL_TABLET | Freq: Four times a day (QID) | ORAL | Status: DC | PRN
  Administered 2015-08-07: 600 mg via ORAL
  Filled 2015-08-07: qty 1

## 2015-08-07 MED ORDER — SODIUM CHLORIDE 0.9 % IJ SOLN
3.0000 mL | Freq: Two times a day (BID) | INTRAMUSCULAR | Status: DC
  Administered 2015-08-07: 3 mL via INTRAVENOUS

## 2015-08-07 MED ORDER — ALUM & MAG HYDROXIDE-SIMETH 200-200-20 MG/5ML PO SUSP: 30.0000 mL | Freq: Four times a day (QID) | ORAL | Status: DC | PRN

## 2015-08-07 MED ORDER — SODIUM CHLORIDE 0.9 % IV SOLN
INTRAVENOUS | Status: AC
  Administered 2015-08-07: 22:00:00 via INTRAVENOUS

## 2015-08-07 MED ORDER — INSULIN ASPART 100 UNIT/ML ~~LOC~~ SOLN: 0.0000 [IU] | Freq: Three times a day (TID) | SUBCUTANEOUS | Status: DC

## 2015-08-07 MED ORDER — OXYMETAZOLINE HCL 0.05 % NA SOLN
2.0000 | Freq: Two times a day (BID) | NASAL | Status: DC | PRN
  Administered 2015-08-07: 2 via NASAL
  Filled 2015-08-07: qty 15

## 2015-08-07 MED ORDER — ENOXAPARIN SODIUM 40 MG/0.4ML ~~LOC~~ SOLN
40.0000 mg | Freq: Every day | SUBCUTANEOUS | Status: DC
  Administered 2015-08-07: 40 mg via SUBCUTANEOUS
  Filled 2015-08-07: qty 0.4

## 2015-08-07 NOTE — H&P (Signed)
[facility] History and Physical  John Fisher:[id number] DOB: 1955-12-15 DOA: 08/07/2015  Referring physician: ED physician PCP: Sonda Primes, MD   Chief Complaint: passed out  HPI:  John Fisher is a 59yo man with PMH of DM2 on oral medications, HTN, ED, osteoarthritis, BPH who presents for syncope.  John Fisher reports that today, he was in his normal state of health, was cooking his dinner of chitlins.  When he went to drain the chitlins (hot water) the steam hit his face and he passed out.  He did not have any preceding symptoms and did not know what had happened until his wife started speaking to him.  He was out for a few seconds.  His wife, who provided some of the history, reported that he was "stiff as a board," he did not hit his head and he did not have any seizure like activity including no loss of bowel or bladder.   On further questioning, John Fisher reports that dizziness and passing out have been an issue for him since late August.  He reports that at that time, he had a worsening of his DM with an A1C rising from 6.1 to 9.  He had worsening polyuria and thirst.  He reports having to pee every 2 hours and waking up multiple times a night to pee.  He also reports decreased sleep.  The dizziness he describes as worse when rising from a seated position or trying to sit up in bed.  He gets transiently lightheaded and this improves with standing for a few minutes or with sitting back down.  This happens regularly, and he has passed out 3-4 times in the last few months.  For his polyuria, he was diagnosed with BPH and was recently started on Alfuzosin and tamsulosin 1-2 months ago.  He also reports a recent increase in itching on the palms of his hands and soles of his feet for which he was started on hydroxyzine 2 weeks ago.  He reports due to all of these issues, he has been worked up extensively at the [facility] and has had an MRI, brain scan and 24 hour urine done.  He does not  remember an EEG being done.  He has never had low sugars, per him.  He specifically denies chest pain, SOB, headache, blurry vision, polyphagia, dysuria, nausea, vomiting, abdominal pain, orthopnea, swelling in his legs, rash or recent illness.    His family reports that EMS told them that his blood pressure dropped significantly with going from a lying to a seated position, but they do not remember the numbers.    Assessment and Plan: Syncope and collapse - DDx includes orthostatic hypotension, vasovagal, cardiac arrhythmia.  Less likely would be basilar stroke (no neuro symptoms on exam) or seizure (also without symptoms).  Also could include hypoglycemic episodes and dehydration - Will ask for orthostatic vital signs - this is the most likely culprit given his presentation, findings by EMS and medications.  He is on 2 medications for BPH which can cause this, he has worsening DM which can contribute with autonomic neuropathy and he is on PRN hydroxyzine.  His BPH medications will be held.  He will be given fluids (1L bolus in ED) and monitored closely.  He should likely d/c tamsulosin on discharge as this is the most common culprit.  (Afluzosin causes dizziness 6% of the time, <1% hypotension; tamsulosin causes orthostatic hypotension on initiation of therapy 6-19% of the time, dizziness 15-17% of  the time; hydroxyzine can cause dizziness as well) - Telemetry monitoring - Repeat EKG as one in the ED showed multiform PVCs and was difficult to assess baseline - IVF with NS at 100cc/hr for 10 hours after bolus - He is complete Chest pain free, so ACS work up has not currently been initiated.  If any concerning changes on repeat EKG or chest pain develops, would warrant work up - I ordered TTE, but this can be cancelled if improvement in symptoms and likely culprit above is confirmed - He does have a Cr of 1.9, unclear baseline as he now follows [facility].  This could be related to dehydration.  IVF  as noted above and holding renally dosed medications (including metformin)  AKI vs. CKD - Cr 1.97, will check in the AM - Holding renally dosed medications - IVF as noted above.   BPH with polyuria - His symptoms are not classic for BPH - he has nocturia, but also notes that he is "peeing every 2 hours"  DDx also include DI and worsening DM. (however, he has a normal urine Na, which would point away from DI) - Check UA for protein, check urine osm.    - From his report, he had a 24 hour urine collection which could have been in search of a DI diagnosis - Holding BPH meds for now given significant syncope above.     Diabetes mellitus type 2, uncontrolled (HCC) - Holding home metformin and glipizide given renal function and possible hypoglycemia - SSI with glucose checks, if any dizziness would consider checking glucose during the event - SSI  - Diabetic diet    Osteoarthritis - Has Rx for methadone and ibuprofen, but does not take the former and only rarely takes the latter - Tylenol and ibuprofen ordered PRN    Hypertension - Reported history, on amlodipine - BP has been soft, hold amlodipine unless improved in the AM - Follow renal function    Dyslipidemia - Continue atorvastatin  Itching - If recurs while inpatient, can consider low dose benadryl  DVT PPx: Lovenox  Diet: Diabetic  Radiological Exams on Admission: Dg Chest 2 View  08/07/2015  CLINICAL DATA:  Syncope.  Hypertension EXAM: CHEST  2 VIEW COMPARISON:  None. FINDINGS: Lungs are clear. Heart size and pulmonary vascularity are normal. No adenopathy. There is prominence of the thoracic aorta. There is mild degenerative change in thoracic spine. IMPRESSION: Prominence of the thoracic aorta may reflect chronic hypertension. No edema or consolidation. Electronically Signed   By: Bretta BangWilliam  Woodruff III M.D.   On: 08/07/2015 19:28     Code Status: Full Family Communication: Pt and family at bedside Disposition Plan:  Admit to obs for overnight observation   Debe CoderMULLEN, Tion Tse, MD [phone]   Review of Systems:  Constitutional: Negative for fever, chills and malaise/fatigue. Negative for diaphoresis.  HENT: Negative for hearing loss, tinnitus and ear discharge.   Eyes: Negative for blurred vision, double vision, photophobia Respiratory: Negative for cough, shortness of breath, wheezing Cardiovascular: Negative for chest pain, palpitations, orthopnea, leg swelling.  Gastrointestinal: Negative for nausea, vomiting and abdominal pain.  Genitourinary: + for frequency, nocturia Negative for dysuria, urgency, hematuria and flank pain.  Musculoskeletal: + for chronic knee pain, fall Negative for myalgias, back pain Skin: + for itching Negative for rash.  Neurological: + for dizziness, lightheadedness, weakness, falls Negative for tingling, sensory change, speech change, focal weakness Endo/Heme/Allergies: + for polydipsia, polyuria     Past Medical History  Diagnosis Date  . Diabetes mellitus   . Hypertension   . Hypogonadism male   . Erectile dysfunction   . Osteoarthritis     Past Surgical History  Procedure Laterality Date  . Replacement total knee Right     Social History:  reports that he has never smoked. He does not have any smokeless tobacco history on file. He reports that he drinks about 4.2 oz of alcohol per week. He reports that he does not use illicit drugs.  No Known Allergies  Family History  Problem Relation Age of Onset  . Diabetes Mother   . Hypertension Father   . Diabetes Father   . Diabetes Brother     Medications - reviewed bottles provided Hydroxyzine PRN Ibuprofen  PRN Glipizide 10 mg daily Metformin  XR daily Tamsulosin 0.4mg  daily Atorvastatin  daily Alfuzosin  daily Amlodipine  daily  Physical Exam: Filed Vitals:   08/07/15 1815 08/07/15 1945 08/07/15 2000 08/07/15 2015  BP: 118/79 106/78 114/86 118/82  Pulse: 93 81 80 77  Temp: 98.4  F (36.9 C)     TempSrc: Oral     Resp: 16     SpO2: 97% 99% 98% 99%    Physical Exam  Constitutional: Appears well-developed and well-nourished. No distress.  HENT: Normocephalic. Oropharynx is clear and moist.  Eyes: Conjunctivae and EOM are normal. PERRLA, no scleral icterus.  Neck: Neck supple.  No thyromegaly.  CVS: RR, NR, S1/S2 +, no murmurs Pulmonary: Effort and breath sounds normal, no rhonchi, wheezes, rales.  Abdominal: Soft. BS +,  no distension, tenderness, Musculoskeletal: No edema and no tenderness.  Lymphadenopathy: No lymphadenopathy noted, cervical Neuro: Alert. Normal muscle tone, moving all extremities normally, PERRLA, EOM intact, speech intact, fluent and oriented to person/place/time Skin: Skin is warm and dry. No rash noted. Not diaphoretic Psychiatric: Normal mood and affect. Behavior, judgment, thought content normal.   Labs on Admission:  Basic Metabolic Panel:  Recent Labs Lab 08/07/15 1823  NA 140  K 3.9  CL 106  CO2 21*  GLUCOSE 224*  BUN 16  CREATININE 1.97*  CALCIUM 8.9   CBC:  Recent Labs Lab 08/07/15 1823  WBC 10.4  NEUTROABS 7.1  HGB 12.8*  HCT 38.7*  MCV 93.3  PLT 192    EKG: Appears to be sinus, multiple PVCs, will repeat   If 7PM-7AM, please contact night-coverage [email or web address] Password [email or web address] 08/07/2015, 8:17 PM

## 2015-08-07 NOTE — ED Notes (Signed)
Per EMS- pt had a witnessed syncope at home. Pt stood to get something and fell to the floor. Pt hit his head but no injury noted. Pt was noted to be hypotensive after the event. Pt had positive orthostatic changes with EMS. No other complaints prior to event.

## 2015-08-07 NOTE — ED Notes (Signed)
Saline drip to start when he arrives back to his room.

## 2015-08-07 NOTE — Progress Notes (Signed)
PATIENT ARRIVED TO UNIT 2W FROM E.D. VIA STRETCHER. ASSISTED TO BED. TELE APPLIED. VITALS OBTAINED. ASSESSMENT COMPLETED.  PATIENT ORIENTED TO UNIT. INSTRUCTED TO CALL FOR ASSISTANCE. URINAL, BELONGINGS, AND WIFE AT BEDSIDE.

## 2015-08-07 NOTE — ED Notes (Signed)
Attempted to call report

## 2015-08-07 NOTE — ED Provider Notes (Signed)
CSN: [id number]     Arrival date & time 08/07/15  1808 History   First MD Initiated Contact with Patient 08/07/15 1813     Chief Complaint  Patient presents with  . Loss of Consciousness    Patient is a 59 y.o. male presenting with syncope. The history is provided by the patient.  Loss of Consciousness Episode history:  Single Most recent episode:  Today Progression:  Resolved Chronicity:  New Witnessed: yes   Relieved by:  Nothing Associated symptoms: no chest pain, no confusion, no diaphoresis, no dizziness, no fever, no headaches, no nausea, no seizures, no shortness of breath, no vomiting and no weakness     Past Medical History  Diagnosis Date  . Diabetes mellitus   . Hypertension   . Hypogonadism male   . Erectile dysfunction   . Osteoarthritis    Past Surgical History  Procedure Laterality Date  . Replacement total knee Right    Family History  Problem Relation Age of Onset  . Diabetes Mother   . Hypertension Father   . Diabetes Father   . Diabetes Brother    Social History  Substance Use Topics  . Smoking status: Never Smoker   . Smokeless tobacco: None  . Alcohol Use: 4.2 oz/week    7 Standard drinks or equivalent per week    Review of Systems  Constitutional: Negative for fever, chills and diaphoresis.  HENT: Negative for rhinorrhea and sore throat.   Eyes: Negative for visual disturbance.  Respiratory: Negative for cough and shortness of breath.   Cardiovascular: Positive for syncope. Negative for chest pain.  Gastrointestinal: Negative for nausea, vomiting, abdominal pain, diarrhea and constipation.  Genitourinary: Negative for dysuria and hematuria.  Musculoskeletal: Negative for back pain and neck pain.  Skin: Negative for rash.  Neurological: Positive for syncope. Negative for dizziness, seizures, facial asymmetry, weakness, light-headedness and headaches.  Psychiatric/Behavioral: Negative for confusion.  All other systems reviewed and are  negative.  Allergies  Review of patient's allergies indicates no known allergies.  Home Medications   Prior to Admission medications   Medication Sig Start Date End Date Taking? Authorizing Provider  alfuzosin (UROXATRAL) 10 MG 24 hr tablet Take 10 mg by mouth at bedtime.   Yes Historical Provider, MD  amLODipine (NORVASC) 5 MG tablet TAKE ONE TABLET BY MOUTH EVERY DAY 09/27/13  Yes Aleksei Plotnikov V, MD  atorvastatin (LIPITOR) 10 MG tablet Take 1 tablet (10 mg total) by mouth daily. Patient taking differently: Take 10 mg by mouth every other day.  05/18/13  Yes Aleksei Plotnikov V, MD  cholecalciferol (VITAMIN D) 1000 UNITS tablet Take 1,000 Units by mouth daily.     Yes Historical Provider, MD  glipiZIDE (GLUCOTROL) 10 MG tablet Take 10 mg by mouth daily.   Yes Historical Provider, MD  hydrOXYzine (VISTARIL) 25 MG capsule Take 25 mg by mouth every 6 (six) hours.   Yes Historical Provider, MD  ibuprofen (ADVIL,MOTRIN) 600 MG tablet Take 600 mg by mouth every 6 (six) hours as needed for mild pain.   Yes Historical Provider, MD  LEVITRA 20 MG tablet TAKE ONE-HALF TO ONE TABLET BY MOUTH EVERY DAY AS NEEDED 05/30/12  Yes Aleksei Plotnikov V, MD  metFORMIN (GLUCOPHAGE) 1000 MG tablet TAKE ONE TABLET BY MOUTH TWICE DAILY Patient taking differently: TAKE two TABLET BY MOUTH  DAILY 09/27/13  Yes Aleksei Plotnikov V, MD  methadone (DOLOPHINE) 10 MG tablet Take 10 mg by mouth 2 (two) times daily as needed for  severe pain.   Yes Historical Provider, MD  oxymetazoline (AFRIN) 0.05 % nasal spray Place 2 sprays into both nostrils 3 (three) times daily.   Yes Historical Provider, MD  tamsulosin (FLOMAX) 0.4 MG CAPS capsule Take 0.4 mg by mouth at bedtime.   Yes Historical Provider, MD  naproxen (NAPROSYN) 500 MG tablet Take 1 tablet (500 mg total) by mouth 2 (two) times daily with a meal. 02/10/11 02/10/12  Aleksei Plotnikov V, MD   BP 118/79 mmHg  Pulse 93  Temp(Src) 98.4 F (36.9 C) (Oral)  Resp 16   SpO2 97% Physical Exam  Constitutional: He is oriented to person, place, and time. He appears well-developed and well-nourished. No distress.  HENT:  Head: Normocephalic and atraumatic.  Mouth/Throat: Oropharynx is clear and moist.  Eyes: EOM are normal.  Neck: Neck supple. No JVD present.  Cardiovascular: Normal rate, regular rhythm, normal heart sounds and intact distal pulses.   Pulmonary/Chest: Effort normal and breath sounds normal.  Abdominal: Soft. He exhibits no distension. There is no tenderness.  Musculoskeletal: Normal range of motion. He exhibits no edema.  Neurological: He is alert and oriented to person, place, and time. No cranial nerve deficit.  Skin: Skin is warm and dry.  Psychiatric: His behavior is normal.    ED Course  Procedures  NONE   Labs Review Labs Reviewed  CBC WITH DIFFERENTIAL/PLATELET - Abnormal; Notable for the following:    RBC 4.15 (*)    Hemoglobin 12.8 (*)    HCT 38.7 (*)    All other components within normal limits  BASIC METABOLIC PANEL - Abnormal; Notable for the following:    CO2 21 (*)    Glucose, Bld 224 (*)    Creatinine, Ser 1.97 (*)    GFR calc non Af Amer 35 (*)    GFR calc Af Amer 41 (*)    All other components within normal limits  Rosezena Sensor, ED    Imaging Review Dg Chest 2 View  08/07/2015  CLINICAL DATA:  Syncope.  Hypertension EXAM: CHEST  2 VIEW COMPARISON:  None. FINDINGS: Lungs are clear. Heart size and pulmonary vascularity are normal. No adenopathy. There is prominence of the thoracic aorta. There is mild degenerative change in thoracic spine. IMPRESSION: Prominence of the thoracic aorta may reflect chronic hypertension. No edema or consolidation. Electronically Signed   By: Bretta Bang III M.D.   On: 08/07/2015 19:28   I have personally reviewed and evaluated these images and lab results as part of my medical decision-making.   EKG Interpretation   Date/Time:  Wednesday August 07 2015 18:16:06  EST Ventricular Rate:  94 PR Interval:  170 QRS Duration: 86 QT Interval:  374 QTC Calculation: 468 R Axis:   -10 Text Interpretation:  Sinus tachycardia Multiform ventricular premature  complexes Left ventricular hypertrophy Nonspecific T abnormalities,  lateral leads Borderline ST elevation, inferior leads No previous tracing  Confirmed by BEATON  MD, ROBERT ([id number]) on 08/07/2015 6:45:54 PM      MDM   Final diagnoses:  Syncope, unspecified syncope type   59 yo M with a PMH of DM, HTN and erectile dysfunction (not currently taking PDE5 inhibitors) who presents with unprovoked syncope. Patient was in his kitchen at home preparing food when he collapsed with no prodromal symptoms. He states this has happened twice before, except he felt lightheaded those times. He denies chest pain, SOB, palpitations, lightheadedness this episode. HDS en route. CBG normal. I interpreted the ECG which shows NSR,  rate 94, no STE or depression, occasional PSVCs, no BBB, no wellens or brugada, intervals maintained. Will get labs and CXR. No DVT or PE risk factors. Heart lung exam normal. No leg edema.  CT neg for acute process. Troponin negative. hgb slightly decreased at 12.8. Creat 1.7. Will admit for syncope workup.  Discussed with dr. Radford PaxBeaton.  Maris BergerJonah Usama Harkless, MD 08/07/15 [phone]  Nelva Nayobert Beaton, MD 08/19/15 [phone]

## 2015-08-07 NOTE — Progress Notes (Signed)
Dr. Criselda PeachesMullen notified of patient's refusal of insulin. No other hypoglycemics ordered at this time.   Patient's reasoning for refusal of med is that it has "killed" too many of his relatives. States that he absolutely will not accept any insulin.

## 2015-08-07 NOTE — ED Notes (Signed)
MD at bedside. 

## 2015-08-07 NOTE — Progress Notes (Signed)
Paged by nursing re: Mr. John Fisher refusing insulin.  Reported that insulin has "killed" family members in the past and he does not want to take it.  He is on oral medications at home, including metformin and glipizide.  Will d/c sliding scale insulin orders, continue to hold oral medications acutely tonight and consider restarting glipizide in the morning pending further work up and renal function.  CBG reported at 254.  Continue CBG monitoring for now.    Debe CoderMULLEN, EMILY, MD [phone]

## 2015-08-07 NOTE — ED Notes (Signed)
hospitalist at bedside

## 2015-08-08 DIAGNOSIS — R55 Syncope and collapse: Secondary | ICD-10-CM | POA: Diagnosis not present

## 2015-08-08 DIAGNOSIS — M1990 Unspecified osteoarthritis, unspecified site: Secondary | ICD-10-CM

## 2015-08-08 DIAGNOSIS — I10 Essential (primary) hypertension: Secondary | ICD-10-CM | POA: Diagnosis not present

## 2015-08-08 DIAGNOSIS — E785 Hyperlipidemia, unspecified: Secondary | ICD-10-CM

## 2015-08-08 DIAGNOSIS — E118 Type 2 diabetes mellitus with unspecified complications: Secondary | ICD-10-CM | POA: Diagnosis not present

## 2015-08-08 DIAGNOSIS — E1165 Type 2 diabetes mellitus with hyperglycemia: Secondary | ICD-10-CM

## 2015-08-08 LAB — BASIC METABOLIC PANEL
ANION GAP: 9 (ref 5–15)
BUN: 15 mg/dL (ref 6–20)
CALCIUM: 8.4 mg/dL — AB (ref 8.9–10.3)
CO2: 23 mmol/L (ref 22–32)
Chloride: 108 mmol/L (ref 101–111)
Creatinine, Ser: 1.52 mg/dL — ABNORMAL HIGH (ref 0.61–1.24)
GFR calc Af Amer: 56 mL/min — ABNORMAL LOW (ref >60)
GFR, EST NON AFRICAN AMERICAN: 48 mL/min — AB (ref >60)
GLUCOSE: 197 mg/dL — AB (ref 65–99)
Potassium: 4.2 mmol/L (ref 3.5–5.1)
Sodium: 140 mmol/L (ref 135–145)

## 2015-08-08 LAB — CBC
HCT: 36 % — ABNORMAL LOW (ref 39.0–52.0)
HEMOGLOBIN: 11.7 g/dL — AB (ref 13.0–17.0)
MCH: 30.4 pg (ref 26.0–34.0)
MCHC: 32.5 g/dL (ref 30.0–36.0)
MCV: 93.5 fL (ref 78.0–100.0)
Platelets: 173 10*3/uL (ref 150–400)
RBC: 3.85 MIL/uL — AB (ref 4.22–5.81)
RDW: 13.1 % (ref 11.5–15.5)
WBC: 7.9 10*3/uL (ref 4.0–10.5)

## 2015-08-08 LAB — TSH: TSH: 0.699 u[IU]/mL (ref 0.350–4.500)

## 2015-08-08 LAB — GLUCOSE, CAPILLARY
GLUCOSE-CAPILLARY: 195 mg/dL — AB (ref 65–99)
Glucose-Capillary: 207 mg/dL — ABNORMAL HIGH (ref 65–99)

## 2015-08-08 MED ORDER — SODIUM CHLORIDE 0.9 % IV SOLN
INTRAVENOUS | Status: AC
  Administered 2015-08-08: 09:00:00 via INTRAVENOUS

## 2015-08-08 MED ORDER — METFORMIN HCL ER 500 MG PO TB24
2000.0000 mg | ORAL_TABLET | Freq: Every day | ORAL | Status: DC
Start: 2015-08-09 — End: 2015-08-08

## 2015-08-08 MED ORDER — METFORMIN HCL ER 500 MG PO TB24
2000.0000 mg | ORAL_TABLET | Freq: Every day | ORAL | Status: DC
  Administered 2015-08-08: 2000 mg via ORAL
  Filled 2015-08-08: qty 4

## 2015-08-08 MED ORDER — GLIPIZIDE 10 MG PO TABS: 10.0000 mg | ORAL_TABLET | Freq: Every day | ORAL | Status: DC

## 2015-08-08 NOTE — Discharge Summary (Signed)
GRAYSIN LUCZYNSKI, is a 59 y.o. male  DOB 1955-12-31  MRN [id number].  Admission date:  08/07/2015  Admitting Physician  Inez Catalina, MD  Discharge Date:  08/08/2015   Primary MD  Sonda Primes, MD  Recommendations for primary care physician for things to follow:  - Check basic labs including CBC, BMP during next visit. - Amlodipine has been stopped giving patient mild orthostatic during hospital stay, reevaluated during next visit   Admission Diagnosis  Syncope, unspecified syncope type [R55]   Discharge Diagnosis  Syncope, unspecified syncope type [R55]    Active Problems:   Diabetes mellitus type 2, uncontrolled (HCC)   Osteoarthritis   Hypertension   Dyslipidemia   BPH (benign prostatic hyperplasia)   Syncope   Syncope and collapse      Past Medical History  Diagnosis Date  . Diabetes mellitus   . Hypertension   . Hypogonadism male   . Erectile dysfunction   . Osteoarthritis     Past Surgical History  Procedure Laterality Date  . Replacement total knee Right        History of present illness and  Hospital Course:     Kindly see H&P for history of present illness and admission details, please review complete Labs, Consult reports and Test reports for all details in brief  HPI  from the history and physical done on the day of admission  Mr. Kohrs is a 59yo man with PMH of DM2 on oral medications, HTN, ED, osteoarthritis, BPH who presents for syncope. Mr. Asfaw reports that today, he was in his normal state of health, was cooking his dinner of chitlins. When he went to drain the chitlins (hot water) the steam hit his face and he passed out. He did not have any preceding symptoms and did not know what had happened until his wife started speaking to him. He was out for a few seconds. His wife, who provided some of the history, reported that he was "stiff as a board," he did not  hit his head and he did not have any seizure like activity including no loss of bowel or bladder. On further questioning, Mr. Paci reports that dizziness and passing out have been an issue for him since late August. He reports that at that time, he had a worsening of his DM with an A1C rising from 6.1 to 9. He had worsening polyuria and thirst. He reports having to pee every 2 hours and waking up multiple times a night to pee. He also reports decreased sleep. The dizziness he describes as worse when rising from a seated position or trying to sit up in bed. He gets transiently lightheaded and this improves with standing for a few minutes or with sitting back down. This happens regularly, and he has passed out 3-4 times in the last few months. For his polyuria, he was diagnosed with BPH and was recently started on Alfuzosin and tamsulosin 1-2 months ago. He also reports a recent increase in itching on the palms  of his hands and soles of his feet for which he was started on hydroxyzine 2 weeks ago. He reports due to all of these issues, he has been worked up extensively at the [facility] and has had an MRI, brain scan and 24 hour urine done. He does not remember an EEG being done. He has never had low sugars, per him. He specifically denies chest pain, SOB, headache, blurry vision, polyphagia, dysuria, nausea, vomiting, abdominal pain, orthopnea, swelling in his legs, rash or recent illness.   His family reports that EMS told them that his blood pressure dropped significantly with going from a lying to a seated position, but they do not remember the numbers.   Hospital Course   Syncope and collapse - This is most likely due to hypertension, vasovagal - Patient is orthostatic heart rate increased on standing, clinically dehydrated with elevated creatinine, significantly improved with intravenous fluid resuscitation, no further orthostatics  as was able to get hallway with no recurrence of  dizziness or lightheadedness. - We'll discontinue hydroxyzine, amlodipine and Flomax. - Uncontrolled diabetes most likely contributing to his dehydration and he is complaining of polyuria. - denies any chest pain or shortness of breath.  Acute versus chronic renal failure - Last creatinine 1.4 in 2014, creatinine is 1.9 on admission, improved with hydration, creatinine is 1.5 on discharge. - Patient was instructed to avoid NSAIDs  Diabetes mellitus - Seems to be poorly controlled, as patient report his glycohemoglobin went from 6 range to 9 range recently. - Had been discussing with patient and wife at bedside about importance of medication compliance, Diet compliance, and physical activity.  Osteoarthritis - Continue with home medication  Hypertension - Patient on amlodipine at home, has been held on discharge giving soft blood pressure during hospital stay  Dyslipidemia - Continue with atorvastatin  Discharge Condition:  Stable   Follow UP  Follow-up Information    Follow up with Sonda Primes, MD. Schedule an appointment as soon as possible for a visit in 1 week.   Specialty:  Internal Medicine   Why:  Posthospitalization follow-up   Contact information:   [address] [place] [address] [phone]         Discharge Instructions  and  Discharge Medications         Discharge Instructions    Discharge instructions    Complete by:  As directed   Follow with Primary MD Sonda Primes, MD in 7 days   Get CBC, CMP, 2 view Chest X ray checked  by Primary MD next visit.    Activity: As tolerated with Full fall precautions use walker/cane & assistance as needed   Disposition Home   Diet: Heart Healthy , carbohydrate modified , with feeding assistance and aspiration precautions.  For Heart failure patients - Check your Weight same time everyday, if you gain over 2 pounds, or you develop in leg swelling, experience more shortness of breath or chest pain,  call your Primary MD immediately. Follow Cardiac Low Salt Diet and 1.5 lit/day fluid restriction.   On your next visit with your primary care physician please Get Medicines reviewed and adjusted.   Please request your Prim.MD to go over all Hospital Tests and Procedure/Radiological results at the follow up, please get all Hospital records sent to your Prim MD by signing hospital release before you go home.   If you experience worsening of your admission symptoms, develop shortness of breath, life threatening emergency, suicidal or homicidal thoughts you must  seek medical attention immediately by calling 911 or calling your MD immediately  if symptoms less severe.  You Must read complete instructions/literature along with all the possible adverse reactions/side effects for all the Medicines you take and that have been prescribed to you. Take any new Medicines after you have completely understood and accpet all the possible adverse reactions/side effects.   Do not drive, operating heavy machinery, perform activities at heights, swimming or participation in water activities or provide baby sitting services if your were admitted for syncope or siezures until you have seen by Primary MD or a Neurologist and advised to do so again.  Do not drive when taking Pain medications.    Do not take more than prescribed Pain, Sleep and Anxiety Medications  Special Instructions: If you have smoked or chewed Tobacco  in the last 2 yrs please stop smoking, stop any regular Alcohol  and or any Recreational drug use.  Wear Seat belts while driving.   Please note  You were cared for by a hospitalist during your hospital stay. If you have any questions about your discharge medications or the care you received while you were in the hospital after you are discharged, you can call the unit and asked to speak with the hospitalist on call if the hospitalist that took care of you is not available. Once you are  discharged, your primary care physician will handle any further medical issues. Please note that NO REFILLS for any discharge medications will be authorized once you are discharged, as it is imperative that you return to your primary care physician (or establish a relationship with a primary care physician if you do not have one) for your aftercare needs so that they can reassess your need for medications and monitor your lab values.     Increase activity slowly    Complete by:  As directed             Medication List    STOP taking these medications        amLODipine 10 MG tablet  Commonly known as:  NORVASC     amLODipine 5 MG tablet  Commonly known as:  NORVASC     hydrOXYzine 25 MG capsule  Commonly known as:  VISTARIL     ibuprofen 600 MG tablet  Commonly known as:  ADVIL,MOTRIN     tamsulosin 0.4 MG Caps capsule  Commonly known as:  FLOMAX      TAKE these medications        alfuzosin 10 MG 24 hr tablet  Commonly known as:  UROXATRAL  Take 10 mg by mouth at bedtime.     atorvastatin 40 MG tablet  Commonly known as:  LIPITOR  Take 40 mg by mouth every other day.     cholecalciferol 1000 UNITS tablet  Commonly known as:  VITAMIN D  Take 1,000 Units by mouth daily.     glipiZIDE 5 MG tablet  Commonly known as:  GLUCOTROL  Take 10 mg by mouth at bedtime.     LEVITRA 20 MG tablet  Generic drug:  vardenafil  TAKE ONE-HALF TO ONE TABLET BY MOUTH EVERY DAY AS NEEDED     metFORMIN 500 MG 24 hr tablet  Commonly known as:  GLUCOPHAGE-XR  Take 2,000 mg by mouth daily with breakfast.     methadone 10 MG tablet  Commonly known as:  DOLOPHINE  Take 10 mg by mouth 2 (two) times daily as needed for severe pain.  oxymetazoline 0.05 % nasal spray  Commonly known as:  AFRIN  Place 2 sprays into both nostrils 3 (three) times daily.          Diet and Activity recommendation: See Discharge Instructions above   Consults obtained -  None   Major procedures and  Radiology Reports - PLEASE review detailed and final reports for all details, in brief -      Dg Chest 2 View  08/07/2015  CLINICAL DATA:  Syncope.  Hypertension EXAM: CHEST  2 VIEW COMPARISON:  None. FINDINGS: Lungs are clear. Heart size and pulmonary vascularity are normal. No adenopathy. There is prominence of the thoracic aorta. There is mild degenerative change in thoracic spine. IMPRESSION: Prominence of the thoracic aorta may reflect chronic hypertension. No edema or consolidation. Electronically Signed   By: Bretta Bang III M.D.   On: 08/07/2015 19:28    Micro Results     No results found for this or any previous visit (from the past 240 hour(s)).     Today   Subjective:   Ananda Sitzer today has no headache,no chest abdominal pain,no new weakness tingling or numbness, feels much better wants to go home today.   Objective:   Blood pressure 130/82, pulse 58, temperature 98 F (36.7 C), temperature source Oral, resp. rate 16, height 6' (1.829 m), weight 102.876 kg (226 lb 12.8 oz), SpO2 100 %.   Intake/Output Summary (Last 24 hours) at 08/08/15 1231 Last data filed at 08/07/15 2228  Gross per 24 hour  Intake      0 ml  Output    200 ml  Net   -200 ml    Exam Awake Alert, Oriented x 3, No new F.N deficits, Normal affect [place].AT,PERRAL Supple Neck,No JVD, No cervical lymphadenopathy appriciated.  Symmetrical Chest wall movement, Good air movement bilaterally, CTAB RRR,No Gallops,Rubs or new Murmurs, No Parasternal Heave +ve B.Sounds, Abd Soft, Non tender, No organomegaly appriciated, No rebound -guarding or rigidity. No Cyanosis, Clubbing or edema, No new Rash or bruise  Data Review   CBC w Diff:  Lab Results  Component Value Date   WBC 7.9 08/08/2015   HGB 11.7* 08/08/2015   HCT 36.0* 08/08/2015   PLT 173 08/08/2015   LYMPHOPCT 26 08/07/2015   MONOPCT 4 08/07/2015   EOSPCT 2 08/07/2015   BASOPCT 0 08/07/2015    CMP:  Lab Results  Component Value  Date   NA 140 08/08/2015   K 4.2 08/08/2015   CL 108 08/08/2015   CO2 23 08/08/2015   BUN 15 08/08/2015   CREATININE 1.52* 08/08/2015   PROT 7.4 02/15/2013   ALBUMIN 4.0 02/15/2013   BILITOT 0.6 02/15/2013   ALKPHOS 51 02/15/2013   AST 20 02/15/2013   ALT 29 02/15/2013  .   Total Time in preparing paper work, data evaluation and todays exam - 35 minutes  Aja Bolander M.D on 08/08/2015 at 12:31 PM  [facility]   Office  [phone]

## 2015-08-08 NOTE — Discharge Instructions (Signed)
Follow with Primary MD Alex Plotnikov, MD in 7 days  ° °Get CBC, CMP, 2 view Chest X ray checked  by Primary MD next visit.  ° ° °Activity: As tolerated with Full fall precautions use walker/cane & assistance as needed ° ° °Disposition Home  ° ° °Diet: Heart Healthy , carbohydrate  modified , with feeding assistance and aspiration precautions. ° °For Heart failure patients - Check your Weight same time everyday, if you gain over 2 pounds, or you develop in leg swelling, experience more shortness of breath or chest pain, call your Primary MD immediately. Follow Cardiac Low Salt Diet and 1.5 lit/day fluid restriction. ° ° °On your next visit with your primary care physician please Get Medicines reviewed and adjusted. ° ° °Please request your Prim.MD to go over all Hospital Tests and Procedure/Radiological results at the follow up, please get all Hospital records sent to your Prim MD by signing hospital release before you go home. ° ° °If you experience worsening of your admission symptoms, develop shortness of breath, life threatening emergency, suicidal or homicidal thoughts you must seek medical attention immediately by calling 911 or calling your MD immediately  if symptoms less severe. ° °You Must read complete instructions/literature along with all the possible adverse reactions/side effects for all the Medicines you take and that have been prescribed to you. Take any new Medicines after you have completely understood and accpet all the possible adverse reactions/side effects.  ° °Do not drive, operating heavy machinery, perform activities at heights, swimming or participation in water activities or provide baby sitting services if your were admitted for syncope or siezures until you have seen by Primary MD or a Neurologist and advised to do so again. ° °Do not drive when taking Pain medications.  ° ° °Do not take more than prescribed Pain, Sleep and Anxiety Medications ° °Special Instructions: If you have smoked  or chewed Tobacco  in the last 2 yrs please stop smoking, stop any regular Alcohol  and or any Recreational drug use. ° °Wear Seat belts while driving. ° ° °Please note ° °You were cared for by a hospitalist during your hospital stay. If you have any questions about your discharge medications or the care you received while you were in the hospital after you are discharged, you can call the unit and asked to speak with the hospitalist on call if the hospitalist that took care of you is not available. Once you are discharged, your primary care physician will handle any further medical issues. Please note that NO REFILLS for any discharge medications will be authorized once you are discharged, as it is imperative that you return to your primary care physician (or establish a relationship with a primary care physician if you do not have one) for your aftercare needs so that they can reassess your need for medications and monitor your lab values. ° °

## 2015-08-08 NOTE — Progress Notes (Signed)
Discussed with the patient and his wife. All questioned fully answered. He will call me if any problems arise. Discharge education included follow up appointment and nutrition r/t diabetes  John Rombergaitlin S Bumbledare, RN

## 2015-08-09 LAB — HEMOGLOBIN A1C
HEMOGLOBIN A1C: 9.6 % — AB (ref 4.8–5.6)
Mean Plasma Glucose: 229 mg/dL

## 2016-02-13 HISTORY — PX: OTHER SURGICAL HISTORY: SHX169

## 2016-03-15 ENCOUNTER — Encounter (HOSPITAL_COMMUNITY): Payer: Self-pay

## 2016-03-15 ENCOUNTER — Emergency Department (HOSPITAL_COMMUNITY)
Admission: EM | Admit: 2016-03-15 | Discharge: 2016-03-16 | Disposition: A | Discharge status: Home / Self Care | Payer: Non-veteran care | Attending: Emergency Medicine | Admitting: Emergency Medicine

## 2016-03-15 DIAGNOSIS — E119 Type 2 diabetes mellitus without complications: Secondary | ICD-10-CM | POA: Insufficient documentation

## 2016-03-15 DIAGNOSIS — M1990 Unspecified osteoarthritis, unspecified site: Secondary | ICD-10-CM | POA: Diagnosis not present

## 2016-03-15 DIAGNOSIS — I10 Essential (primary) hypertension: Secondary | ICD-10-CM | POA: Diagnosis not present

## 2016-03-15 DIAGNOSIS — R531 Weakness: Secondary | ICD-10-CM | POA: Diagnosis present

## 2016-03-15 DIAGNOSIS — Z79899 Other long term (current) drug therapy: Secondary | ICD-10-CM | POA: Insufficient documentation

## 2016-03-15 DIAGNOSIS — Z7951 Long term (current) use of inhaled steroids: Secondary | ICD-10-CM | POA: Insufficient documentation

## 2016-03-15 DIAGNOSIS — Z7984 Long term (current) use of oral hypoglycemic drugs: Secondary | ICD-10-CM | POA: Insufficient documentation

## 2016-03-15 DIAGNOSIS — Z791 Long term (current) use of non-steroidal anti-inflammatories (NSAID): Secondary | ICD-10-CM | POA: Insufficient documentation

## 2016-03-15 DIAGNOSIS — Z7982 Long term (current) use of aspirin: Secondary | ICD-10-CM | POA: Diagnosis not present

## 2016-03-15 DIAGNOSIS — Z96653 Presence of artificial knee joint, bilateral: Secondary | ICD-10-CM | POA: Insufficient documentation

## 2016-03-15 DIAGNOSIS — D649 Anemia, unspecified: Secondary | ICD-10-CM | POA: Insufficient documentation

## 2016-03-15 LAB — CBG MONITORING, ED: Glucose-Capillary: 137 mg/dL — ABNORMAL HIGH (ref 65–99)

## 2016-03-15 LAB — CBC WITH DIFFERENTIAL/PLATELET
Basophils Absolute: 0 10*3/uL (ref 0.0–0.1)
Basophils Relative: 0 %
EOS PCT: 0 %
Eosinophils Absolute: 0 10*3/uL (ref 0.0–0.7)
HEMATOCRIT: 29 % — AB (ref 39.0–52.0)
Hemoglobin: 9.7 g/dL — ABNORMAL LOW (ref 13.0–17.0)
LYMPHS ABS: 1.2 10*3/uL (ref 0.7–4.0)
LYMPHS PCT: 10 %
MCH: 30 pg (ref 26.0–34.0)
MCHC: 33.4 g/dL (ref 30.0–36.0)
MCV: 89.8 fL (ref 78.0–100.0)
MONO ABS: 0.7 10*3/uL (ref 0.1–1.0)
MONOS PCT: 6 %
NEUTROS ABS: 10.1 10*3/uL — AB (ref 1.7–7.7)
Neutrophils Relative %: 84 %
PLATELETS: 234 10*3/uL (ref 150–400)
RBC: 3.23 MIL/uL — ABNORMAL LOW (ref 4.22–5.81)
RDW: 14.1 % (ref 11.5–15.5)
WBC: 12 10*3/uL — ABNORMAL HIGH (ref 4.0–10.5)

## 2016-03-15 LAB — BASIC METABOLIC PANEL
ANION GAP: 8 (ref 5–15)
BUN: 26 mg/dL — AB (ref 6–20)
CHLORIDE: 109 mmol/L (ref 101–111)
CO2: 23 mmol/L (ref 22–32)
Calcium: 8.8 mg/dL — ABNORMAL LOW (ref 8.9–10.3)
Creatinine, Ser: 1.76 mg/dL — ABNORMAL HIGH (ref 0.61–1.24)
GFR calc Af Amer: 47 mL/min — ABNORMAL LOW (ref >60)
GFR calc non Af Amer: 40 mL/min — ABNORMAL LOW (ref >60)
GLUCOSE: 153 mg/dL — AB (ref 65–99)
POTASSIUM: 4 mmol/L (ref 3.5–5.1)
Sodium: 140 mmol/L (ref 135–145)

## 2016-03-15 MED ORDER — MORPHINE SULFATE (PF) 4 MG/ML IV SOLN
4.0000 mg | Freq: Once | INTRAVENOUS | Status: AC
  Administered 2016-03-15: 4 mg via INTRAVENOUS
  Filled 2016-03-15: qty 1

## 2016-03-15 MED ORDER — SODIUM CHLORIDE 0.9 % IV BOLUS (SEPSIS)
1000.0000 mL | Freq: Once | INTRAVENOUS | Status: AC
  Administered 2016-03-15: 1000 mL via INTRAVENOUS

## 2016-03-15 NOTE — ED Notes (Addendum)
EKG completed but not exported/printed.  Monitor's currently down.  RN and EDP notified.

## 2016-03-15 NOTE — ED Provider Notes (Signed)
CSN: [id number]     Arrival date & time 03/15/16  2100 History   First MD Initiated Contact with Patient 03/15/16 2119     Chief Complaint  Patient presents with  . Weakness    HPI Pt was recently released from the hosptial 2 days ago after having knee surgery.  He was out and about today doing a lot of errands.  Pt went to [employer] to get some groceries. He was bending over to tie his shoe laces.  He fell forward and almost consciousness.  While walking in [employer] he had another episode but he did not pass out completely.  He twisted his knee when he fell.  It is not too painful though. Pt had not eaten well today and he was doing a lot more activity than usual.  Someone at [employer] called 911.  He really did not want to come here but EMS noticed his blood pressure was low.  He denies rectal bleeding.  No dark stools  Past Medical History  Diagnosis Date  . Diabetes mellitus   . Hypertension   . Hypogonadism male   . Erectile dysfunction   . Osteoarthritis    Past Surgical History  Procedure Laterality Date  . Replacement total knee Right   . Left knee replacement  02/13/2016   Family History  Problem Relation Age of Onset  . Diabetes Mother   . Hypertension Father   . Diabetes Father   . Diabetes Brother    Social History  Substance Use Topics  . Smoking status: Never Smoker   . Smokeless tobacco: None  . Alcohol Use: 4.2 oz/week    7 Standard drinks or equivalent per week    Review of Systems    Allergies  Review of patient's allergies indicates no known allergies.  Home Medications   Prior to Admission medications   Medication Sig Start Date End Date Taking? Authorizing Provider  amLODipine (NORVASC) 10 MG tablet Take 10 mg by mouth daily.   Yes Historical Provider, MD  aspirin EC 81 MG tablet Take 81 mg by mouth daily.   Yes Historical Provider, MD  atorvastatin (LIPITOR) 20 MG tablet Take 10 mg by mouth daily.   Yes Historical Provider, MD   betamethasone valerate (VALISONE) 0.1 % cream Apply 1 application topically daily. Small amount applied to face daily   Yes Historical Provider, MD  cetirizine (ZYRTEC) 10 MG tablet Take 10 mg by mouth daily.   Yes Historical Provider, MD  fluticasone (FLONASE) 50 MCG/ACT nasal spray Place 1-2 sprays into both nostrils daily as needed for allergies.   Yes Historical Provider, MD  glipiZIDE (GLUCOTROL) 10 MG tablet Take 10 mg by mouth 2 (two) times daily before a meal.   Yes Historical Provider, MD  metFORMIN (GLUCOPHAGE-XR) 500 MG 24 hr tablet Take 2,000 mg by mouth daily with breakfast.   Yes Historical Provider, MD  oxyCODONE-acetaminophen (PERCOCET) 10-325 MG tablet Take 1-2 tablets by mouth every 6 (six) hours as needed. For pain. 07/11/10  Yes Historical Provider, MD  pioglitazone (ACTOS) 15 MG tablet Take 15 mg by mouth daily.   Yes Historical Provider, MD  sildenafil (VIAGRA) 100 MG tablet Take 100 mg by mouth daily as needed for erectile dysfunction.   Yes Historical Provider, MD  Vitamin D, Ergocalciferol, (DRISDOL) 50000 units CAPS capsule Take 50,000 Units by mouth every 7 (seven) days.   Yes Historical Provider, MD   BP 109/72 mmHg  Pulse 76  Resp 23  SpO2 97% Physical Exam  Constitutional: He appears well-developed and well-nourished. No distress.  HENT:  Head: Normocephalic and atraumatic.  Right Ear: External ear normal.  Left Ear: External ear normal.  Eyes: Conjunctivae are normal. Right eye exhibits no discharge. Left eye exhibits no discharge. No scleral icterus.  Neck: Neck supple. No tracheal deviation present.  Cardiovascular: Normal rate, regular rhythm and intact distal pulses.   Pulmonary/Chest: Effort normal and breath sounds normal. No stridor. No respiratory distress. He has no wheezes. He has no rales.  Abdominal: Soft. Bowel sounds are normal. He exhibits no distension. There is no tenderness. There is no rebound and no guarding.  Musculoskeletal: He exhibits  no edema or tenderness.  Old scar right knee, healing surgical scar left knee, no erythema , no increased warmth  Neurological: He is alert. He has normal strength. No cranial nerve deficit (no facial droop, extraocular movements intact, no slurred speech) or sensory deficit. He exhibits normal muscle tone. He displays no seizure activity. Coordination normal.  Skin: Skin is warm and dry. No rash noted.  Psychiatric: He has a normal mood and affect.  Nursing note and vitals reviewed.   ED Course  Procedures (including critical care time) Labs Review Labs Reviewed  BASIC METABOLIC PANEL - Abnormal; Notable for the following:    Glucose, Bld 153 (*)    BUN 26 (*)    Creatinine, Ser 1.76 (*)    Calcium 8.8 (*)    GFR calc non Af Amer 40 (*)    GFR calc Af Amer 47 (*)    All other components within normal limits  CBC WITH DIFFERENTIAL/PLATELET - Abnormal; Notable for the following:    WBC 12.0 (*)    RBC 3.23 (*)    Hemoglobin 9.7 (*)    HCT 29.0 (*)    Neutro Abs 10.1 (*)    All other components within normal limits  CBG MONITORING, ED - Abnormal; Notable for the following:    Glucose-Capillary 137 (*)    All other components within normal limits   I have personally reviewed and evaluated these images and lab results as part of my medical decision-making.   EKG Interpretation   Date/Time:  Sunday March 15 2016 21:45:44 EDT Ventricular Rate:  80 PR Interval:    QRS Duration: 94 QT Interval:  371 QTC Calculation: 428 R Axis:   -9 Text Interpretation:  Sinus rhythm Borderline abnrm T, anterolateral leads  Baseline wander in lead(s) V5 nonspecific t wave changes since last  tracing Confirmed by Karey Suthers  MD-J, Dylana Shaw ([id number]) on 03/15/2016 11:33:31 PM      MDM   Final diagnoses:  Anemia, unspecified anemia type     Lab tests show decreased hemoglobin.  He denies any rectal bleeding.  Pt refuses rectal exam.  I suspect this is his post op hemoglobin with his recent surgery.     This likely contributed to his weakness earlier.  Pt was monitored in the ED.  BP is stable.  He is feeling fine.DC home.  Follow up with PCP    Linwood DibblesJon Tiffany Talarico, MD 03/16/16 [phone]

## 2016-03-15 NOTE — ED Notes (Signed)
Bed: [id number] Expected date:  Expected time:  Means of arrival:  Comments: 4334 M hypotension

## 2016-03-15 NOTE — ED Notes (Signed)
[facility] [employer] [facility] and near syncopal episode. Patient had recent left knee replacement, has been moving into new apt. and went to store to buy "more beer". Patient began to feel weak and dizzy, sat down outside to eat candy bar, and EMS was called. Patient denies LOC, but was diaphoretic and pale upon EMS arrival. Positive orthostatic VS. 81/57 sitting, 70/50 standing. Patient stated he has had some beer today but no water and has taken his pain medication (oxycodone). Patient was resistant to medical care. He tried to get out of ambulance and had another near syncopal episode. EMS reports patient very irritable regarding his knee pain and efforts made by EMS to assist him.

## 2017-05-24 ENCOUNTER — Encounter: Payer: Self-pay | Admitting: Physical Therapy

## 2017-05-24 ENCOUNTER — Ambulatory Visit: Payer: Non-veteran care | Attending: Physician Assistant | Admitting: Physical Therapy

## 2017-05-24 DIAGNOSIS — G8929 Other chronic pain: Secondary | ICD-10-CM | POA: Insufficient documentation

## 2017-05-24 DIAGNOSIS — M25562 Pain in left knee: Secondary | ICD-10-CM | POA: Insufficient documentation

## 2017-05-24 DIAGNOSIS — M25661 Stiffness of right knee, not elsewhere classified: Secondary | ICD-10-CM | POA: Insufficient documentation

## 2017-05-24 DIAGNOSIS — M25561 Pain in right knee: Secondary | ICD-10-CM | POA: Diagnosis not present

## 2017-05-24 DIAGNOSIS — M25662 Stiffness of left knee, not elsewhere classified: Secondary | ICD-10-CM | POA: Diagnosis not present

## 2017-05-24 DIAGNOSIS — R262 Difficulty in walking, not elsewhere classified: Secondary | ICD-10-CM | POA: Insufficient documentation

## 2017-05-24 NOTE — Therapy (Addendum)
[place], [place], [address] Phone: [phone]   Fax:  [phone]  Physical Therapy Evaluation/Addendum Discharge  Patient Details  Name: John Fisher MRN: [id number] Date of Birth: 02-21-1956 Referring Provider: Geronimo Running, PA-C  Encounter Date: 05/24/2017      PT End of Session - 05/24/17 1052    Visit Number 1   Number of Visits 14  1 eval, 12 tx and 1 re eval    Date for PT Re-Evaluation 07/19/17  6-8 weeks    [facility]   Authorization Time Period 6 mos    Authorization - Visit Number 1   Authorization - Number of Visits 14   PT Start Time 0845   PT Stop Time 0933   PT Time Calculation (min) 48 min   Activity Tolerance Patient tolerated treatment well   Behavior During Therapy [facility] for tasks assessed/performed      Past Medical History:  Diagnosis Date  . Diabetes mellitus   . Erectile dysfunction   . Hypertension   . Hypogonadism male   . Osteoarthritis     Past Surgical History:  Procedure Laterality Date  . left knee replacement  02/13/2016  . REPLACEMENT TOTAL KNEE Right     There were no vitals filed for this visit.       Subjective Assessment - 05/24/17 0903    Subjective Patient with chronic post surgical knee pain L>R .  He developed Rt. foot pain about 6-8 mos ago which was severe and prevented him from walking.  Pain in knees, Rt. foot, weakness  in both legs, slowing gait speed.  He feels unsteady at times. He has diff standing from a toilet and coming up from the floor. He has decreased balance confidence on uneven ground and has fallen x 1 .     Pertinent History DM, L TKA 02/2016, Rt. TKA 2015   Limitations Sitting;Lifting;Standing;Walking;House hold activities;Other (comment)  sleep interrupted, not working    How long can you sit comfortably? driving, sitting,  1 hour    How long can you stand comfortably? standing if needed    How long can you  walk comfortably? 20-30 min    Diagnostic tests XR both knees  and Rt. foot    Patient Stated Goals Pt would like to avoid pain and better walking.     Currently in Pain? Yes   Pain Score 10-Worst pain ever   Pain Location Knee   Pain Orientation Right   Pain Descriptors / Indicators Aching;Sore   Pain Type Chronic pain   Pain Onset More than a month ago   Pain Frequency Constant   Aggravating Factors  walking, bending it    Pain Relieving Factors swimming , ice, TENS, rest    Effect of Pain on Daily Activities painful ADLs, mobility    Multiple Pain Sites Yes   Pain Score 10   Pain Location Knee   Pain Orientation Left;Lateral   Pain Type Chronic pain   Pain Onset More than a month ago   Pain Frequency Constant   Aggravating Factors  bending it, walking   Pain Relieving Factors rest, same as above    Effect of Pain on Daily Activities Above   Pain Score 10   Pain Location Foot   Pain Orientation Right;Medial;Anterior   Pain Descriptors / Indicators Pounding;Jabbing   Pain Type Chronic pain   Pain Onset More than a month ago   Pain Frequency Constant  Aggravating Factors  walking, AM hours, standing    Pain Relieving Factors massaging it, rest    Effect of Pain on Daily Activities slows him down             [facility] PT Assessment - 05/24/17 0001      Assessment   Medical Diagnosis Bilateral knee pain    Referring Provider Geronimo Running, PA-C   Onset Date/Surgical Date --  chronic    Prior Therapy Yes x 3     Precautions   Precautions None     Restrictions   Weight Bearing Restrictions No     Balance Screen   Has the patient fallen in the past 6 months Yes   How many times? 1   Has the patient had a decrease in activity level because of a fear of falling?  Yes   Is the patient reluctant to leave their home because of a fear of falling?  No     Home Ecologist residence     Prior Function   Level of Independence Independent    Vocation Retired   Leisure did not assess     Cognition   Overall Cognitive Status Within Functional Limits for tasks assessed     Sensation   Light Touch Appears Intact     Posture/Postural Control   Posture/Postural Control Postural limitations   Postural Limitations Rounded Shoulders;Forward head;Posterior pelvic tilt;Flexed trunk   Posture Comments wide BOS, knees flexed in stance      AROM   Right Knee Extension -10   Right Knee Flexion 84   Left Knee Extension -8   Left Knee Flexion 90   Right Ankle Dorsiflexion -10   Right Ankle Plantar Flexion 40     PROM   Overall PROM Comments no pain with Rt. ankle PROM inversion and eversion      Strength   Right Hip Flexion 3-/5   Left Hip Flexion 3-/5   Right/Left Knee --  poor effort , pain    Right Knee Flexion 3+/5   Right Knee Extension 3+/5   Left Knee Flexion 3+/5   Left Knee Extension 3+/5   Right Ankle Dorsiflexion 4+/5   Right Ankle Plantar Flexion 4/5     Flexibility   Soft Tissue Assessment /Muscle Length --  tight hamstring, gastoc soleus bilateral      Palpation   Patella mobility good    Spinal mobility NT   Palpation comment tender L lateral knee joint line, pain in Rt. foot mostly in arch and medial aspect, along first ray, mild in heel             Objective measurements completed on examination: See above findings.          [facility] PT Treatment/Exercise - 05/24/17 0001      Self-Care   Self-Care Other Self-Care Comments   Other Self-Care Comments  rationale for exercise, stretching, posture and continued rehab     Ankle Exercises: Stretches   Plantar Fascia Stretch 3 reps   Plantar Fascia Stretch Limitations off step   Soleus Stretch 2 reps;30 seconds   Gastroc Stretch 2 reps;30 seconds                PT Education - 05/24/17 1050    Education provided Yes   Education Details PT/POC, referral, HEP for stretching Rt. foot, plantar fasciitis, gait compensations ,  strengthening    Person(s) Educated Patient   Methods Explanation;Demonstration;Verbal  cues;Handout   Comprehension Verbalized understanding;Returned demonstration;Verbal cues required;Tactile cues required;Need further instruction          PT Short Term Goals - 05/24/17 1251      PT SHORT TERM GOAL #1   Title Pt will be I with initial HEP for lower extremities.    Time 3   Period Weeks   Status New   Target Date 06/14/17     PT SHORT TERM GOAL #2   Title Pt will complete Berg Balance scale and/or other testing to determine fall risk.    Time 3   Period Weeks   Status New   Target Date 06/14/17           PT Long Term Goals - 05/24/17 1252      PT LONG TERM GOAL #1   Title Pt will report 25% less pain, stiffness in AM when standing up from the bed, walking.    Time 8   Period Weeks   Status New   Target Date 07/19/17     PT LONG TERM GOAL #2   Title Pt will demo knee strength 4+/5 or more to aid in transfers, stability with gait   Time 8   Period Weeks   Status New   Target Date 07/19/17     PT LONG TERM GOAL #3   Title Pt will improve gait speed to that of a community ambulator (>2.62 feet/sec)    Time 8   Period Weeks   Status New   Target Date 07/19/17     PT LONG TERM GOAL #4   Title Pt will improve balance, to be determined after evaluation, to reduce fall risk.    Time 8   Period Weeks   Status New   Target Date 07/19/17                Plan - 05/24/17 1105    Clinical Impression Statement Patient with low complexity eval of stabnle, ongoing knee pain s/p total joint replacement. He complains mostly of Rt. foot pain and knee pain (10/10 ) which limits his mobility, causes him to walk slowly and have difficulty with transfers.  He has significant weakness and tightness in bilateral knees.  He was adamant about avoiding pain today so I gave him some stretches that will ultimately benefit his knees but were to target Rt. foot pain.  He may be  altering his gait and causing inflammation along Rt. arch.  He has had PT multiple times without lasting benefit and has increaed pain folllwing hte sessions which he does not want to go through again.  He plans to have a consult at the [facility] for his Rt. foot.  Until that is complete, we will not treat his Rt. foot.  Pt iunderstands and will [facility].    Clinical Presentation Stable   Clinical Decision Making Low   Rehab Potential Good   PT Frequency 2x / week   PT Duration 6 weeks   PT Treatment/Interventions Iontophoresis '4mg'$ /ml Dexamethasone;Gait training;Stair training;Neuromuscular re-education;Manual techniques;Taping;Patient/family education;Therapeutic activities;Ultrasound;Cryotherapy;Electrical Stimulation;DME Instruction;Balance training;Passive range of motion;Therapeutic exercise;Moist Heat;ADLs/Self Care Home Management   PT Next Visit Plan check ankle/foot stretches, NuStep, manual to Rt. foot    PT Home Exercise Plan standing ankle stretches   Consulted and Agree with Plan of Care Patient      Patient will benefit from skilled therapeutic intervention in order to improve the following deficits and impairments:  Increased fascial restricitons, Pain,  Postural dysfunction, Decreased mobility, Decreased activity tolerance, Decreased range of motion, Decreased strength, Impaired flexibility, Difficulty walking, Decreased balance  Visit Diagnosis: Chronic pain of left knee  Chronic pain of right knee  Stiffness of right knee, not elsewhere classified  Stiffness of left knee, not elsewhere classified  Difficulty in walking, not elsewhere classified      G-Codes - May 28, 2017 1258    Functional Assessment Tool Used (Outpatient Only) FOTO   Functional Limitation Mobility: Walking and moving around   Mobility: Walking and Moving Around Current Status [phone]) At least 60 percent but less than 80 percent impaired, limited or restricted   Mobility: Walking and Moving  Around Goal Status [phone]) At least 40 percent but less than 60 percent impaired, limited or restricted       Problem List Patient Active Problem List   Diagnosis Date Noted  . BPH (benign prostatic hyperplasia) 08/07/2015  . Syncope 08/07/2015  . Syncope and collapse 08/07/2015  . Dyslipidemia 05/18/2013  . Shoulder pain, right 02/10/2011  . Hypertension 02/10/2011  . Insomnia 02/10/2011  . 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 HYPOGONADISM 08/19/2010  . VITAMIN B12 DEFICIENCY 08/19/2010  . TESTICULAR PAIN 08/19/2010  . FATIGUE 08/19/2010  . SNORING 08/19/2010  . SKIN RASH, ALLERGIC 01/08/2009  . Diabetes mellitus type 2, uncontrolled ([place]) 12/11/2008  . VITAMIN D DEFICIENCY 12/13/2007  . ERECTILE DYSFUNCTION 12/13/2007  . Osteoarthritis 12/13/2007  . FOOT PAIN 12/13/2007  . ABNORMAL GLUCOSE NEC 12/13/2007    Gearldine Looney 05/28/2017, 1:00 PM  [facility] [address] [place], [place], [address] Phone: [phone]   Fax:  [phone]  Name: SYLVAIN HASTEN MRN: [id number] Date of Birth: 03/21/1956   Raeford Razor, PT May 28, 2017 1:00 PM Phone: [phone] Fax: [phone]   PHYSICAL THERAPY DISCHARGE SUMMARY  Visits from Start of Care: 1  Current functional level related to goals / functional outcomes: Seeabove  Remaining deficits: See above   Education / Equipment: PT and rationale Plan: Patient agrees to discharge.  Patient goals were not met. Patient is being discharged due to not returning since the last visit.  ?????    Patient cancelled his appts.  He is not willing to endure pain in knee for PT.  His pain in foot is what he would like to focus on.  I called the [facility] to relay this message and he will need to be seen at the [facility] in order for that to occur.  Raeford Razor, PT 06/16/17 2:58 PM Phone: [phone] Fax: [phone]

## 2017-06-08 ENCOUNTER — Ambulatory Visit: Payer: Non-veteran care | Admitting: Physical Therapy

## 2018-06-10 ENCOUNTER — Other Ambulatory Visit: Payer: Self-pay

## 2018-06-10 ENCOUNTER — Encounter (HOSPITAL_COMMUNITY): Payer: Self-pay | Admitting: Emergency Medicine

## 2018-06-10 ENCOUNTER — Emergency Department (HOSPITAL_COMMUNITY)
Admission: EM | Admit: 2018-06-10 | Discharge: 2018-06-11 | Disposition: A | Discharge status: Home / Self Care | Payer: Non-veteran care | Attending: Emergency Medicine | Admitting: Emergency Medicine

## 2018-06-10 DIAGNOSIS — Z96652 Presence of left artificial knee joint: Secondary | ICD-10-CM | POA: Insufficient documentation

## 2018-06-10 DIAGNOSIS — I10 Essential (primary) hypertension: Secondary | ICD-10-CM | POA: Diagnosis not present

## 2018-06-10 DIAGNOSIS — Z79899 Other long term (current) drug therapy: Secondary | ICD-10-CM | POA: Diagnosis not present

## 2018-06-10 DIAGNOSIS — E119 Type 2 diabetes mellitus without complications: Secondary | ICD-10-CM | POA: Insufficient documentation

## 2018-06-10 DIAGNOSIS — R55 Syncope and collapse: Secondary | ICD-10-CM

## 2018-06-10 DIAGNOSIS — Z7982 Long term (current) use of aspirin: Secondary | ICD-10-CM | POA: Diagnosis not present

## 2018-06-10 LAB — BASIC METABOLIC PANEL
ANION GAP: 8 (ref 5–15)
BUN: 15 mg/dL (ref 8–23)
CHLORIDE: 111 mmol/L (ref 98–111)
CO2: 24 mmol/L (ref 22–32)
Calcium: 8.8 mg/dL — ABNORMAL LOW (ref 8.9–10.3)
Creatinine, Ser: 1.38 mg/dL — ABNORMAL HIGH (ref 0.61–1.24)
GFR calc Af Amer: >60 mL/min (ref >60)
GFR calc non Af Amer: 53 mL/min — ABNORMAL LOW (ref >60)
Glucose, Bld: 106 mg/dL — ABNORMAL HIGH (ref 70–99)
POTASSIUM: 3.9 mmol/L (ref 3.5–5.1)
SODIUM: 143 mmol/L (ref 135–145)

## 2018-06-10 LAB — CBC
HEMATOCRIT: 37.5 % — AB (ref 39.0–52.0)
HEMOGLOBIN: 12.4 g/dL — AB (ref 13.0–17.0)
MCH: 32.8 pg (ref 26.0–34.0)
MCHC: 33.1 g/dL (ref 30.0–36.0)
MCV: 99.2 fL (ref 78.0–100.0)
Platelets: 171 10*3/uL (ref 150–400)
RBC: 3.78 MIL/uL — AB (ref 4.22–5.81)
RDW: 13.6 % (ref 11.5–15.5)
WBC: 9 10*3/uL (ref 4.0–10.5)

## 2018-06-10 NOTE — ED Notes (Signed)
[facility] [phone]

## 2018-06-10 NOTE — ED Triage Notes (Signed)
Pt reports 4 episodes of syncope in the last month. Pt reports it occurs after bending over but not every time that he bends over. Pt denies CP, N/V, headache. Pt reports there was a change BP medicine about 2 weeks ago.

## 2018-06-10 NOTE — ED Provider Notes (Signed)
[facility] EMERGENCY DEPARTMENT Provider Note   CSN: [id number] Arrival date & time: 06/10/18  1539     History   Chief Complaint Chief Complaint  Patient presents with  . Loss of Consciousness    HPI John Fisher is a 62 y.o. male w PMHx DM, HTN, presenting to the ED with complaint of 4 episodes of syncope throughout the last month. States episodes occur when he is bending down. Last episode was today, when he was bending down to lock his utility closet. He states he has the syncopal episodes on the way down, not when he stands back up. He is unsure of how long he was unconscious for, episodes have been unwitnessed. Does endorse mild HA. Denies head trauma, CP, palpitations, SOB, abd pain, vision changes, or other assoc sx. He states his PCP recently changed his BP medication 2 weeks ago, amlodipine. He is unsure of what the change was, though he knows his dose was not changed from 10mg . Reports he was warned that his BP may be higher with this medication change.  These syncopal episodes are not new. They occurred 2 years ago with assoc admission. He was noted to be slightly orthostatic and dehydrated. He also had an MRI done along with additional workup at the [facility] which were normal as well.   The history is provided by the patient.    Past Medical History:  Diagnosis Date  . Diabetes mellitus   . Erectile dysfunction   . Hypertension   . Hypogonadism male   . Osteoarthritis     Patient Active Problem List   Diagnosis Date Noted  . BPH (benign prostatic hyperplasia) 08/07/2015  . Syncope 08/07/2015  . Syncope and collapse 08/07/2015  . Dyslipidemia 05/18/2013  . Shoulder pain, right 02/10/2011  . Hypertension 02/10/2011  . Insomnia 02/10/2011  . 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 HYPOGONADISM 08/19/2010  . VITAMIN B12 DEFICIENCY 08/19/2010  . TESTICULAR PAIN 08/19/2010  . FATIGUE 08/19/2010  . SNORING 08/19/2010  . SKIN RASH, ALLERGIC  01/08/2009  . Diabetes mellitus type 2, uncontrolled ([facility]) 12/11/2008  . VITAMIN D DEFICIENCY 12/13/2007  . ERECTILE DYSFUNCTION 12/13/2007  . Osteoarthritis 12/13/2007  . FOOT PAIN 12/13/2007  . ABNORMAL GLUCOSE NEC 12/13/2007    Past Surgical History:  Procedure Laterality Date  . left knee replacement  02/13/2016  . REPLACEMENT TOTAL KNEE Right         Home Medications    Prior to Admission medications   Medication Sig Start Date End Date Taking? Authorizing Provider  amLODipine (NORVASC) 10 MG tablet Take 10 mg by mouth daily.    [provider]  aspirin EC 81 MG tablet Take 81 mg by mouth daily.    [provider]  atorvastatin (LIPITOR) 20 MG tablet Take 10 mg by mouth daily.    [provider]  betamethasone valerate (VALISONE) 0.1 % cream Apply 1 application topically daily. Small amount applied to face daily    [provider]  cetirizine (ZYRTEC) 10 MG tablet Take 10 mg by mouth daily.    [provider]  fluticasone (FLONASE) 50 MCG/ACT nasal spray Place 1-2 sprays into both nostrils daily as needed for allergies.    [provider]  glipiZIDE (GLUCOTROL) 10 MG tablet Take 10 mg by mouth 2 (two) times daily before a meal.    [provider]  metFORMIN (GLUCOPHAGE-XR) 500 MG 24 hr tablet Take 2,000 mg by mouth daily with breakfast.  [provider]  oxyCODONE-acetaminophen (PERCOCET) 10-325 MG tablet Take 1-2 tablets by mouth every 6 (six) hours as needed. For pain. 07/11/10   [provider]  pioglitazone (ACTOS) 15 MG tablet Take 15 mg by mouth daily.    [provider]  sildenafil (VIAGRA) 100 MG tablet Take 100 mg by mouth daily as needed for erectile dysfunction.    [provider]  traMADol (ULTRAM) 50 MG tablet Take by mouth every 6 (six) hours as needed.    [provider]  Vitamin D, Ergocalciferol, (DRISDOL) 50000 units CAPS capsule Take 50,000 Units by  mouth every 7 (seven) days.    [provider]    Family History Family History  Problem Relation Age of Onset  . Diabetes Mother   . Hypertension Father   . Diabetes Father   . Diabetes Brother     Social History Social History   Tobacco Use  . Smoking status: Never Smoker  . Smokeless tobacco: Never Used  Substance Use Topics  . Alcohol use: Yes    Alcohol/week: 14.0 standard drinks    Types: 14 Standard drinks or equivalent per week  . Drug use: No     Allergies   Patient has no known allergies.   Review of Systems Review of Systems  Constitutional: Negative for fever.  Eyes: Negative for visual disturbance.  Respiratory: Negative for shortness of breath.   Cardiovascular: Negative for chest pain and palpitations.  Gastrointestinal: Negative for nausea and vomiting.  Neurological: Positive for syncope and headaches. Negative for weakness, light-headedness and numbness.  Hematological: Does not bruise/bleed easily.  All other systems reviewed and are negative.    Physical Exam Updated Vital Signs BP (!) 164/94   Pulse (!) 56   Temp 98.8 F (37.1 C) (Oral)   Resp (!) 21   Ht 6' (1.829 m)   Wt 106.6 kg   SpO2 100%   BMI 31.87 kg/m   Physical Exam  Constitutional: He is oriented to person, place, and time. He appears well-developed and well-nourished. No distress.  HENT:  Head: Normocephalic and atraumatic.  Eyes: Pupils are equal, round, and reactive to light. Conjunctivae and EOM are normal.  Neck: Normal range of motion. Neck supple.  Cardiovascular: Normal rate, regular rhythm, normal heart sounds and intact distal pulses.  Pulmonary/Chest: Effort normal and breath sounds normal. No respiratory distress.  Abdominal: Soft. Bowel sounds are normal. He exhibits no distension. There is no tenderness. There is no guarding.  Musculoskeletal: Normal range of motion.  Neurological: He is alert and oriented to person, place, and time.  Mental  Status:  Alert, oriented, thought content appropriate, able to give a coherent history. Speech fluent without evidence of aphasia. Able to follow 2 step commands without difficulty.  Cranial Nerves:  II:  Peripheral visual fields grossly normal, pupils equal, round, reactive to light III,IV, VI: ptosis not present, extra-ocular motions intact bilaterally  V,VII: smile symmetric, facial light touch sensation equal VIII: hearing grossly normal to voice  X: uvula elevates symmetrically  XI: bilateral shoulder shrug symmetric and strong XII: midline tongue extension without fassiculations Motor:  Normal tone. 5/5 in upper and lower extremities bilaterally including strong and equal grip strength and dorsiflexion/plantar flexion Sensory: Pinprick and light touch normal in all extremities.  Deep Tendon Reflexes: 2+ and symmetric in the biceps and patella Cerebellar: normal finger-to-nose with bilateral upper extremities Gait: normal gait and balance CV: distal pulses palpable throughout    Skin: Skin is warm.  Psychiatric: He has a normal mood and affect. His behavior is normal.  Nursing note and vitals reviewed.    ED Treatments / Results  Labs (all labs ordered are listed, but only abnormal results are displayed) Labs Reviewed  BASIC METABOLIC PANEL - Abnormal; Notable for the following components:      Result Value   Glucose, Bld 106 (*)    Creatinine, Ser 1.38 (*)    Calcium 8.8 (*)    GFR calc non Af Amer 53 (*)    All other components within normal limits  CBC - Abnormal; Notable for the following components:   RBC 3.78 (*)    Hemoglobin 12.4 (*)    HCT 37.5 (*)    All other components within normal limits  I-STAT TROPONIN, ED    EKG EKG Interpretation  Date/Time:  Friday June 10 2018 15:50:25 EDT Ventricular Rate:  72 PR Interval:  186 QRS Duration: 78 QT Interval:  416 QTC Calculation: 455 R Axis:   -31 Text Interpretation:  Normal sinus rhythm Left axis  deviation Left ventricular hypertrophy Nonspecific ST and T wave abnormality Abnormal ECG Confirmed by Benjiman Core [phone]) on 06/10/2018 10:44:41 PM   Radiology No results found.  Procedures Procedures (including critical care time)  Medications Ordered in ED Medications - No data to display   Initial Impression / Assessment and Plan / ED Course  I have reviewed the triage vital signs and the nursing notes.  Pertinent labs & imaging results that were available during my care of the patient were reviewed by me and considered in my medical decision making (see chart for details).  Clinical Course as of Jun 11 56  Fri Jun 10, 2018  2245 Discussed pt with Dr. Rubin Payor. Will obtain trop and orthostatics. If normal, discharge with PCP vs cards vs neuro f/u   [JR]    Clinical Course User Index [JR] Robinson, [place] N, PA-C    Pt with hx of syncope, presenting to the ED with recurrent episodes.  Syncope occurs when patient bends forward.  Recent blood pressure medication change though appears to be in the middle of these current episodes and doubt this is related.  On exam he is well-appearing and nondistressed.  Heart and lung sounds normal.  Normal neurologic exam.  Labs are reassuring, creatinine improved since previous.  EKG without acute change.  Troponin is negative.  Orthostatics are normal.  He is not feeling dizzy in the ED.  Discussed this with Dr. Rubin Payor who recommends patient is safe for discharge with outpatient follow-up.  Patient states he is seen by the [facility] and is able to see specialists they are including neurology and cardiology.  Strict return precautions.  Patient is safe for discharge at this time.  Discussed results, findings, treatment and follow up. Patient advised of return precautions. Patient verbalized understanding and agreed with plan.   Final Clinical Impressions(s) / ED Diagnoses   Final diagnoses:  Syncope, unspecified syncope type    ED  Discharge Orders    None       Robinson, [place] N, PA-C 06/11/18 0057    Benjiman Core, MD 06/13/18 1510

## 2018-06-10 NOTE — ED Notes (Signed)
ED Provider at bedside. 

## 2018-06-10 NOTE — ED Provider Notes (Signed)
Patient placed in Quick Look pathway, seen and evaluated   Chief Complaint: syncope  HPI:   John Fisher is a 62 y.o. male who presents to the ED with c/o 4 syncopal episodes in the past month. Patient reports it occurs when he is bending down and then stands up. Patient denies chest pain, n/v or headache. Patient reports his doctor changed his BP medication about 2 weeks ago.  ROS: Neuro: light headed, syncope   Physical Exam:  BP (!) 167/109 (BP Location: Right Arm)   Pulse 67   Temp 99.7 F (37.6 C) (Oral)   Resp 16   Ht 6' (1.829 m)   Wt 106.6 kg   SpO2 99%   BMI 31.87 kg/m    Gen: No distress  Neuro: Awake and Alert  Skin: Warm and dry  Heart: regular rate and rhythm  Lungs: clear   Initiation of care has begun. The patient has been counseled on the process, plan, and necessity for staying for the completion/evaluation, and the remainder of the medical screening examination    Janne Napoleon, NP 06/10/18 1606    Derwood Kaplan, MD 06/10/18 1644

## 2018-06-11 LAB — I-STAT TROPONIN, ED: Troponin i, poc: 0 ng/mL (ref 0.00–0.08)

## 2018-06-11 NOTE — Discharge Instructions (Addendum)
Schedule an appoint with your primary care provider she can be further evaluated for your recurrent episodes of syncope. Return to the ER for chest pain, severe headache, vision changes, or new or concerning symptoms.

## 2018-09-15 ENCOUNTER — Encounter (HOSPITAL_COMMUNITY): Payer: Self-pay | Admitting: Emergency Medicine

## 2018-09-15 ENCOUNTER — Emergency Department (HOSPITAL_COMMUNITY)
Admission: EM | Admit: 2018-09-15 | Discharge: 2018-09-15 | Disposition: A | Discharge status: Home / Self Care | Payer: Non-veteran care | Attending: Emergency Medicine | Admitting: Emergency Medicine

## 2018-09-15 ENCOUNTER — Other Ambulatory Visit: Payer: Self-pay

## 2018-09-15 DIAGNOSIS — I10 Essential (primary) hypertension: Secondary | ICD-10-CM | POA: Diagnosis not present

## 2018-09-15 DIAGNOSIS — M25511 Pain in right shoulder: Secondary | ICD-10-CM | POA: Insufficient documentation

## 2018-09-15 DIAGNOSIS — Z7984 Long term (current) use of oral hypoglycemic drugs: Secondary | ICD-10-CM | POA: Diagnosis not present

## 2018-09-15 DIAGNOSIS — E119 Type 2 diabetes mellitus without complications: Secondary | ICD-10-CM | POA: Insufficient documentation

## 2018-09-15 MED ORDER — METHOCARBAMOL 500 MG PO TABS: 500.0000 mg | ORAL_TABLET | Freq: Three times a day (TID) | ORAL | 0 refills | Status: DC | PRN

## 2018-09-15 NOTE — ED Notes (Signed)
Patient states he couldn't sign e-signature pad d/t shoulder pain.

## 2018-09-15 NOTE — ED Triage Notes (Addendum)
Pt reports rt shoulder pain that started 3 days ago that has gotten worse. Pain with movement. Limited movement, PMS intact. Reports sitting up to sleep because it hurts so bad. Pt denies any injuries or falls. Pt has attempted heat packs with some relief.

## 2018-09-15 NOTE — ED Notes (Signed)
After multiple conversations with patient regarding MD's discharge instructions, patient verbalized understanding of discharge instructions and prescription medication and denies any further needs or questions at this time. Provided ace bandage and heat pack. VS stable. Patient ambulatory with steady gait - declined wheelchair and walked to ED entrance.

## 2018-09-18 NOTE — ED Provider Notes (Signed)
[facility] EMERGENCY DEPARTMENT Provider Note   CSN: [id number] Arrival date & time: 09/15/18  7654     History   Chief Complaint Chief Complaint  Patient presents with  . Shoulder Pain    HPI John Fisher is a 63 y.o. male.  HPI Patient is a 63 year old male presents the emergency department 3 days of gradual worsening of right shoulder pain without swelling or weakness of his right upper extremity.  No recent injury or falls.  Denies fevers or chills.  No history of gout.  Denies swelling or focal tenderness to his right shoulder.  Denies heavy lifting.  He is tried over-the-counter medications without improvement in symptoms.  He is tried heat packs without improvement in symptoms.  He reports his right shoulder is painful to move.   Past Medical History:  Diagnosis Date  . Diabetes mellitus   . Erectile dysfunction   . Hypertension   . Hypogonadism male   . Osteoarthritis     Patient Active Problem List   Diagnosis Date Noted  . BPH (benign prostatic hyperplasia) 08/07/2015  . Syncope 08/07/2015  . Syncope and collapse 08/07/2015  . Dyslipidemia 05/18/2013  . Shoulder pain, right 02/10/2011  . Hypertension 02/10/2011  . Insomnia 02/10/2011  . 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 HYPOGONADISM 08/19/2010  . VITAMIN B12 DEFICIENCY 08/19/2010  . TESTICULAR PAIN 08/19/2010  . FATIGUE 08/19/2010  . SNORING 08/19/2010  . SKIN RASH, ALLERGIC 01/08/2009  . Diabetes mellitus type 2, uncontrolled (HCC) 12/11/2008  . VITAMIN D DEFICIENCY 12/13/2007  . ERECTILE DYSFUNCTION 12/13/2007  . Osteoarthritis 12/13/2007  . FOOT PAIN 12/13/2007  . ABNORMAL GLUCOSE NEC 12/13/2007    Past Surgical History:  Procedure Laterality Date  . left knee replacement  02/13/2016  . REPLACEMENT TOTAL KNEE Right         Home Medications    Prior to Admission medications   Medication Sig Start Date End Date Taking? Authorizing Provider  amLODipine (NORVASC)  10 MG tablet Take 10 mg by mouth daily.    [provider]  aspirin EC 81 MG tablet Take 81 mg by mouth daily.    [provider]  atorvastatin (LIPITOR) 20 MG tablet Take 10 mg by mouth daily.    [provider]  betamethasone valerate (VALISONE) 0.1 % cream Apply 1 application topically daily. Small amount applied to face daily    [provider]  cetirizine (ZYRTEC) 10 MG tablet Take 10 mg by mouth daily.    [provider]  fluticasone (FLONASE) 50 MCG/ACT nasal spray Place 1-2 sprays into both nostrils daily as needed for allergies.    [provider]  glipiZIDE (GLUCOTROL) 10 MG tablet Take 10 mg by mouth 2 (two) times daily before a meal.    [provider]  metFORMIN (GLUCOPHAGE-XR) 500 MG 24 hr tablet Take 2,000 mg by mouth daily with breakfast.    [provider]  methocarbamol (ROBAXIN) 500 MG tablet Take 1 tablet (500 mg total) by mouth every 8 (eight) hours as needed for muscle spasms. 09/15/18   Azalia Bilis, MD  oxyCODONE-acetaminophen (PERCOCET) 10-325 MG tablet Take 1-2 tablets by mouth every 6 (six) hours as needed. For pain. 07/11/10   [provider]  pioglitazone (ACTOS) 15 MG tablet Take 15 mg by mouth daily.    [provider]  sildenafil (VIAGRA) 100 MG tablet Take 100 mg by mouth daily as needed for erectile dysfunction.    [provider]  traMADol (ULTRAM) 50 MG tablet Take by mouth every 6 (six) hours as needed.    [provider]  Vitamin D, Ergocalciferol, (DRISDOL) 50000 units CAPS capsule Take 50,000 Units by mouth every 7 (seven) days.    [provider]    Family History Family History  Problem Relation Age of Onset  . Diabetes Mother   . Hypertension Father   . Diabetes Father   . Diabetes Brother     Social History Social History   Tobacco Use  . Smoking status: Never Smoker  . Smokeless tobacco: Never Used  Substance Use Topics  .  Alcohol use: Yes    Alcohol/week: 14.0 standard drinks    Types: 14 Standard drinks or equivalent per week  . Drug use: No     Allergies   Patient has no known allergies.   Review of Systems Review of Systems  All other systems reviewed and are negative.    Physical Exam Updated Vital Signs BP (!) 143/97   Pulse 86   Temp 98.5 F (36.9 C) (Oral)   Resp 16   Ht 6' (1.829 m)   Wt 108.9 kg   SpO2 97%   BMI 32.55 kg/m   Physical Exam Vitals signs and nursing note reviewed.  Constitutional:      Appearance: He is well-developed.  HENT:     Head: Normocephalic.  Neck:     Musculoskeletal: Normal range of motion.  Cardiovascular:     Rate and Rhythm: Normal rate and regular rhythm.  Pulmonary:     Effort: Pulmonary effort is normal.  Abdominal:     General: There is no distension.  Musculoskeletal: Normal range of motion.     Comments: Only mild pain with passive range of motion but more severe pain with active range of motion of his right shoulder.  Normal right radial pulse.  Normal grip strength right hand.  Full range of motion of right wrist, right elbow.  No focal tenderness of the right shoulder.  No warmth or erythema or rash of the right shoulder.  No obvious deformity noted at the right clavicle.  No C-spine tenderness.  No anterior chest tenderness or swelling noted.  No swelling of the right upper extremity as compared to the left.  Neurological:     Mental Status: He is alert and oriented to person, place, and time.      ED Treatments / Results  Labs (all labs ordered are listed, but only abnormal results are displayed) Labs Reviewed - No data to display  EKG None  Radiology No results found.  Procedures Procedures (including critical care time)  Medications Ordered in ED Medications - No data to display   Initial Impression / Assessment and Plan / ED Course  I have reviewed the triage vital signs and the nursing notes.  Pertinent labs &  imaging results that were available during my care of the patient were reviewed by me and considered in my medical decision making (see chart for details).     Nonspecific symptoms of the right shoulder.  May represent internal derangement.  Doubt septic arthritis.  Could represent mild gouty flare.  No weakness of the right upper extremity.  Patient will be treated with a sling, over-the-counter anti-inflammatories.  Primary care follow-up.  He understands to return to the emergency department for new or worsening symptoms.  No indication for advanced imaging or laboratory studies at this time.  All questions answered.  Muscle relaxant will  be added anti-inflammatories to help with symptom control.  Final Clinical Impressions(s) / ED Diagnoses   Final diagnoses:  Acute pain of right shoulder    ED Discharge Orders         Ordered    methocarbamol (ROBAXIN) 500 MG tablet  Every 8 hours PRN     09/15/18 0756           Azalia Bilis, MD 09/18/18 [phone]

## 2019-12-25 ENCOUNTER — Ambulatory Visit (INDEPENDENT_AMBULATORY_CARE_PROVIDER_SITE_OTHER): Payer: No Typology Code available for payment source | Admitting: Allergy and Immunology

## 2019-12-25 ENCOUNTER — Other Ambulatory Visit: Payer: Self-pay

## 2019-12-25 ENCOUNTER — Encounter: Payer: Self-pay | Admitting: Allergy and Immunology

## 2019-12-25 VITALS — BP 130/80 | HR 80 | Temp 98.5°F | Resp 18 | Ht 72.0 in | Wt 230.6 lb

## 2019-12-25 DIAGNOSIS — J3089 Other allergic rhinitis: Secondary | ICD-10-CM | POA: Diagnosis not present

## 2019-12-25 MED ORDER — FLUTICASONE PROPIONATE 50 MCG/ACT NA SUSP: 2.0000 | Freq: Every day | NASAL | 5 refills | Status: DC

## 2019-12-25 MED ORDER — AZELASTINE HCL 0.1 % NA SOLN: 1.0000 | Freq: Two times a day (BID) | NASAL | 5 refills | Status: DC

## 2019-12-25 NOTE — Patient Instructions (Addendum)
Perennial and seasonal allergic rhinitis  Aeroallergen avoidance measures have been discussed and provided in written form.  A prescription has been provided for azelastine nasal spray, 1-2 sprays per nostril 2 times daily as needed. Proper nasal spray technique has been discussed and demonstrated.   May also use fluticasone nasal spray, 2 sprays per nostril daily if needed.  Nasal saline spray (i.e., Simply Saline) or nasal saline lavage (i.e., NeilMed) is recommended as needed and prior to medicated nasal sprays.  If allergen avoidance measures and medications fail to adequately relieve symptoms, aeroallergen immunotherapy will be considered.   Return in about 3 months (around 03/25/2020), or if symptoms worsen or fail to improve.  Control of Dust Mite Allergen  House dust mites play a major role in allergic asthma and rhinitis.  They occur in environments with high humidity wherever human skin, the food for dust mites is found. High levels have been detected in dust obtained from mattresses, pillows, carpets, upholstered furniture, bed covers, clothes and soft toys.  The principal allergen of the house dust mite is found in its feces.  A gram of dust may contain 1,000 mites and 250,000 fecal particles.  Mite antigen is easily measured in the air during house cleaning activities.    1. Encase mattresses, including the box spring, and pillow, in an air tight cover.  Seal the zipper end of the encased mattresses with wide adhesive tape. 2. Wash the bedding in water of 130 degrees Farenheit weekly.  Avoid cotton comforters/quilts and flannel bedding: the most ideal bed covering is the dacron comforter. 3. Remove all upholstered furniture from the bedroom. 4. Remove carpets, carpet padding, rugs, and non-washable window drapes from the bedroom.  Wash drapes weekly or use plastic window coverings. 5. Remove all non-washable stuffed toys from the bedroom.  Wash stuffed toys weekly. 6. Have the  room cleaned frequently with a vacuum cleaner and a damp dust-mop.  The patient should not be in a room which is being cleaned and should wait 1 hour after cleaning before going into the room. 7. Close and seal all heating outlets in the bedroom.  Otherwise, the room will become filled with dust-laden air.  An electric heater can be used to heat the room. Reduce indoor humidity to less than 50%.  Do not use a humidifier.   Reducing Pollen Exposure  The American Academy of Allergy, Asthma and Immunology suggests the following steps to reduce your exposure to pollen during allergy seasons.    1. Do not hang sheets or clothing out to dry; pollen may collect on these items. 2. Do not mow lawns or spend time around freshly cut grass; mowing stirs up pollen. 3. Keep windows closed at night.  Keep car windows closed while driving. 4. Minimize morning activities outdoors, a time when pollen counts are usually at their highest. 5. Stay indoors as much as possible when pollen counts or humidity is high and on windy days when pollen tends to remain in the air longer. 6. Use air conditioning when possible.  Many air conditioners have filters that trap the pollen spores. 7. Use a HEPA room air filter to remove pollen form the indoor air you breathe.   Control of Dog or Cat Allergen  Avoidance is the best way to manage a dog or cat allergy. If you have a dog or cat and are allergic to dog or cats, consider removing the dog or cat from the home. If you have a dog or cat  but don't want to find it a new home, or if your family wants a pet even though someone in the household is allergic, here are some strategies that may help keep symptoms at bay:  1. Keep the pet out of your bedroom and restrict it to only a few rooms. Be advised that keeping the dog or cat in only one room will not limit the allergens to that room. 2. Don't pet, hug or kiss the dog or cat; if you do, wash your hands with soap and  water. 3. High-efficiency particulate air (HEPA) cleaners run continuously in a bedroom or living room can reduce allergen levels over time. 4. Place electrostatic material sheet in the air inlet vent in the bedroom. 5. Regular use of a high-efficiency vacuum cleaner or a central vacuum can reduce allergen levels. 6. Giving your dog or cat a bath at least once a week can reduce airborne allergen.   Control of Cockroach Allergen  Cockroach allergen has been identified as an important cause of acute attacks of asthma, especially in urban settings.  There are fifty-five species of cockroach that exist in the [place], however only three, the [place], [place] species produce allergen that can affect patients with Asthma.  Allergens can be obtained from fecal particles, egg casings and secretions from cockroaches.    1. Remove food sources. 2. Reduce access to water. 3. Seal access and entry points. 4. Spray runways with 0.5-1% Diazinon or Chlorpyrifos 5. Blow boric acid power under stoves and refrigerator. 6. Place bait stations (hydramethylnon) at feeding sites.

## 2019-12-25 NOTE — Progress Notes (Signed)
New Patient Note  RE: John Fisher MRN: [id number] DOB: 06-19-1956 Date of Office Visit: 12/25/2019  Referring provider: Yvette Rack, MD Primary care provider: Yvette Rack, MD  Chief Complaint: Nasal Congestion   History of present illness: John Fisher is a 64 y.o. male seen today in consultation requested by Yvette Rack, MD.  He complains of longstanding nasal congestion.  Over the past 20 to 25 years, he has experienced persistent nasal congestion which is year-round, however seems to be worse during the springtime with pollen exposure.  He also notes that the nasal congestion is worse when lying down at night. He used oxymetazoline for many years with benefit, however began developing epistaxis, therefore discontinued this medication was started on fluticasone nasal spray.  He admits that he "easily" goes through a 44-month supply of fluticasone nasal spray in approximately 3 weeks because he uses 2 to 3 sprays per nostril a few to several times per day.  He denies epistaxis since starting the fluticasone nasal spray.  He denies a history of anosmia.  He saw an otolaryngologist at the [place] in the past who had discussed a surgical option, however John Fisher did not follow-up.  Assessment and plan: Perennial and seasonal allergic rhinitis  Aeroallergen avoidance measures have been discussed and provided in written form.  A prescription has been provided for azelastine nasal spray, 1-2 sprays per nostril 2 times daily as needed. Proper nasal spray technique has been discussed and demonstrated.   May also use fluticasone nasal spray, 2 sprays per nostril daily if needed.  Nasal saline spray (i.e., Simply Saline) or nasal saline lavage (i.e., NeilMed) is recommended as needed and prior to medicated nasal sprays.  If allergen avoidance measures and medications fail to adequately relieve symptoms, aeroallergen immunotherapy will be considered.   Meds ordered this  encounter  Medications  . azelastine (ASTELIN) 0.1 % nasal spray    Sig: Place 1-2 sprays into both nostrils 2 (two) times daily.    Dispense:  30 mL    Refill:  5  . fluticasone (FLONASE) 50 MCG/ACT nasal spray    Sig: Place 2 sprays into both nostrils daily.    Dispense:  16 g    Refill:  5    Diagnostics: Epicutaneous testing: Positive to grass pollen, ragweed pollen, tree pollen, cat hair, cockroach antigen, and dust mite antigen. Intradermal testing: Positive to [place] grass, [place] grass, weed pollen mix, and cat hair.   Physical examination: Blood pressure 130/80, pulse 80, temperature 98.5 F (36.9 C), temperature source Temporal, resp. rate 18, height 6' (1.829 m), weight 230 lb 9.6 oz (104.6 kg), SpO2 98 %.  General: Alert, interactive, in no acute distress. HEENT: TMs pearly gray, turbinates moderately edematous without discharge, post-pharynx mildly erythematous. Neck: Supple without lymphadenopathy. Lungs: Clear to auscultation without wheezing, rhonchi or rales. CV: Normal S1, S2 without murmurs. Abdomen: Nondistended, nontender. Skin: Warm and dry, without lesions or rashes. Extremities:  No clubbing, cyanosis or edema. Neuro:   Grossly intact.  Review of systems:  Review of systems negative except as noted in HPI / PMHx or noted below: Review of Systems  Constitutional: Negative.   HENT: Negative.   Eyes: Negative.   Respiratory: Negative.   Cardiovascular: Negative.   Gastrointestinal: Negative.   Genitourinary: Negative.   Musculoskeletal: Negative.   Skin: Negative.   Neurological: Negative.   Endo/Heme/Allergies: Negative.   Psychiatric/Behavioral: Negative.     Past medical history:  Past Medical History:  Diagnosis Date  . Diabetes mellitus   . Erectile dysfunction   . Hypertension   . Hypogonadism male   . Osteoarthritis     Past surgical history:  Past Surgical History:  Procedure Laterality Date  . left knee replacement   02/13/2016  . REPLACEMENT TOTAL KNEE Right     Family history: Family History  Problem Relation Age of Onset  . Diabetes Mother   . Hypertension Father   . Diabetes Father   . Diabetes Brother     Social history: Social History   Socioeconomic History  . Marital status: Married    Spouse name: Not on file  . Number of children: Not on file  . Years of education: Not on file  . Highest education level: Not on file  Occupational History  . Not on file  Tobacco Use  . Smoking status: Never Smoker  . Smokeless tobacco: Never Used  Substance and Sexual Activity  . Alcohol use: Yes    Alcohol/week: 14.0 standard drinks    Types: 14 Standard drinks or equivalent per week  . Drug use: No  . Sexual activity: Yes  Other Topics Concern  . Not on file  Social History Narrative  . Not on file   Social Determinants of Health   Financial Resource Strain:   . Difficulty of Paying Living Expenses:   Food Insecurity:   . Worried About Programme researcher, broadcasting/film/video in the Last Year:   . Barista in the Last Year:   Transportation Needs:   . Freight forwarder (Medical):   Marland Kitchen Lack of Transportation (Non-Medical):   Physical Activity:   . Days of Exercise per Week:   . Minutes of Exercise per Session:   Stress:   . Feeling of Stress :   Social Connections:   . Frequency of Communication with Friends and Family:   . Frequency of Social Gatherings with Friends and Family:   . Attends Religious Services:   . Active Member of Clubs or Organizations:   . Attends Banker Meetings:   Marland Kitchen Marital Status:   Intimate Partner Violence:   . Fear of Current or Ex-Partner:   . Emotionally Abused:   Marland Kitchen Physically Abused:   . Sexually Abused:     Environmental History: The patient lives in a 64 year old house with carpeting throughout and central air/heat.  There is no known mold/water damage in the home.  There are no pets in the home.  He is a non-smoker.  Current  Outpatient Medications  Medication Sig Dispense Refill  . amLODipine (NORVASC) 10 MG tablet Take 10 mg by mouth daily.    Marland Kitchen aspirin EC 81 MG tablet Take 81 mg by mouth daily.    Marland Kitchen atorvastatin (LIPITOR) 20 MG tablet Take 10 mg by mouth daily.    . fluticasone (FLONASE) 50 MCG/ACT nasal spray Place 1-2 sprays into both nostrils daily as needed for allergies.    . metFORMIN (GLUCOPHAGE-XR) 500 MG 24 hr tablet Take 2,000 mg by mouth daily with breakfast.    . methocarbamol (ROBAXIN) 500 MG tablet Take 1 tablet (500 mg total) by mouth every 8 (eight) hours as needed for muscle spasms. 12 tablet 0  . oxyCODONE-acetaminophen (PERCOCET) 10-325 MG tablet Take 1-2 tablets by mouth every 6 (six) hours as needed. For pain.    . sildenafil (VIAGRA) 100 MG tablet Take 100 mg by mouth daily as needed for erectile dysfunction.    . traMADol Janean Sark)  50 MG tablet Take by mouth every 6 (six) hours as needed.    Marland Kitchen azelastine (ASTELIN) 0.1 % nasal spray Place 1-2 sprays into both nostrils 2 (two) times daily. 30 mL 5  . betamethasone valerate (VALISONE) 0.1 % cream Apply 1 application topically daily. Small amount applied to face daily    . cetirizine (ZYRTEC) 10 MG tablet Take 10 mg by mouth daily.    . fluticasone (FLONASE) 50 MCG/ACT nasal spray Place 2 sprays into both nostrils daily. 16 g 5  . glipiZIDE (GLUCOTROL) 10 MG tablet Take 10 mg by mouth 2 (two) times daily before a meal.    . pioglitazone (ACTOS) 15 MG tablet Take 15 mg by mouth daily.    . Vitamin D, Ergocalciferol, (DRISDOL) 50000 units CAPS capsule Take 50,000 Units by mouth every 7 (seven) days.     No current facility-administered medications for this visit.    Known medication allergies: No Known Allergies  I appreciate the opportunity to take part in Damario's care. Please do not hesitate to contact me with questions.  Sincerely,   R. Edgar Frisk, MD

## 2019-12-25 NOTE — Assessment & Plan Note (Signed)
   Aeroallergen avoidance measures have been discussed and provided in written form.  A prescription has been provided for azelastine nasal spray, 1-2 sprays per nostril 2 times daily as needed. Proper nasal spray technique has been discussed and demonstrated.   May also use fluticasone nasal spray, 2 sprays per nostril daily if needed.  Nasal saline spray (i.e., Simply Saline) or nasal saline lavage (i.e., NeilMed) is recommended as needed and prior to medicated nasal sprays.  If allergen avoidance measures and medications fail to adequately relieve symptoms, aeroallergen immunotherapy will be considered.

## 2019-12-26 ENCOUNTER — Encounter: Payer: Self-pay | Admitting: Allergy and Immunology

## 2020-03-25 ENCOUNTER — Encounter: Payer: Self-pay | Admitting: Allergy

## 2020-03-25 ENCOUNTER — Other Ambulatory Visit: Payer: Self-pay

## 2020-03-25 ENCOUNTER — Ambulatory Visit (INDEPENDENT_AMBULATORY_CARE_PROVIDER_SITE_OTHER): Payer: No Typology Code available for payment source | Admitting: Allergy

## 2020-03-25 VITALS — BP 118/70 | HR 84 | Temp 98.6°F | Resp 16 | Wt 218.8 lb

## 2020-03-25 DIAGNOSIS — J3089 Other allergic rhinitis: Secondary | ICD-10-CM

## 2020-03-25 MED ORDER — AZELASTINE HCL 0.1 % NA SOLN: 1.0000 | Freq: Two times a day (BID) | NASAL | 5 refills | Status: DC

## 2020-03-25 NOTE — Assessment & Plan Note (Signed)
Past history - 2021 skin testing was positive to grass, weed, cat, ragweed, tree, dust mites and cockroach. Interim history - still having nasal congestion but did not use nasal sprays as recommended.  Continue environmental control measures.  May use over the counter antihistamines such as Zyrtec (cetirizine), Claritin (loratadine), Allegra (fexofenadine), or Xyzal (levocetirizine) daily as needed.  Use azelastine nasal spray 1 spray per nostril twice a day for drainage.  Restart fluticasone nasal spray 1 spray per nostril twice a day for nasal congestion. Demonstrated proper use.   Read about allergy injections.  If above regimen does not control symptoms will discuss ENT referral and/or starting allergy immunotherapy next.

## 2020-03-25 NOTE — Progress Notes (Signed)
Follow Up Note  RE: John Fisher MRN: [id number] DOB: Jan 04, 1956 Date of Office Visit: 03/25/2020  Referring provider: Yvette Rack, MD Primary care provider: Yvette Rack, MD  Chief Complaint: Follow-up and Allergic Rhinitis  (no improvement )  History of Present Illness: I had the pleasure of seeing John Fisher for a follow up visit at the [facility] on 03/25/2020. He is a 64 y.o. male, who is being followed for allergic rhinitis. His previous allergy office visit was on 12/25/2019 with Dr. Nunzio Cobbs. Today is a regular follow up visit.  Perennial and seasonal allergic rhinitis Currently on azelastine 2 sprays per nostril twice a day with no benefit. No nosebleeds. Main complaint is nasal congestion. He stopped using Flonase once he picked up azelastine as he didn't know he was supposed to use both of them. He did have some nosebleeds with Flonase in the past.  No recent ENT evaluation.  Not taking any antihistamines at this time.  Not interested in immunotherapy.  Assessment and Plan: Temple is a 64 y.o. male with: Perennial and seasonal allergic rhinitis Past history - 2021 skin testing was positive to grass, weed, cat, ragweed, tree, dust mites and cockroach. Interim history - still having nasal congestion but did not use nasal sprays as recommended.  Continue environmental control measures.  May use over the counter antihistamines such as Zyrtec (cetirizine), Claritin (loratadine), Allegra (fexofenadine), or Xyzal (levocetirizine) daily as needed.  Use azelastine nasal spray 1 spray per nostril twice a day for drainage.  Restart fluticasone nasal spray 1 spray per nostril twice a day for nasal congestion. Demonstrated proper use.   Read about allergy injections.  If above regimen does not control symptoms will discuss ENT referral and/or starting allergy immunotherapy next.   Return in about 2 months (around 05/26/2020).  Meds ordered  this encounter  Medications  . azelastine (ASTELIN) 0.1 % nasal spray    Sig: Place 1-2 sprays into both nostrils 2 (two) times daily. For nasal drainage.    Dispense:  30 mL    Refill:  5   Diagnostics: None.  Medication List:  Current Outpatient Medications  Medication Sig Dispense Refill  . amLODipine (NORVASC) 10 MG tablet Take 10 mg by mouth daily.    Marland Kitchen aspirin EC 81 MG tablet Take 81 mg by mouth daily.    Marland Kitchen atorvastatin (LIPITOR) 20 MG tablet Take 10 mg by mouth daily.    Marland Kitchen azelastine (ASTELIN) 0.1 % nasal spray Place 1-2 sprays into both nostrils 2 (two) times daily. For nasal drainage. 30 mL 5  . betamethasone valerate (VALISONE) 0.1 % cream Apply 1 application topically daily. Small amount applied to face daily    . cetirizine (ZYRTEC) 10 MG tablet Take 10 mg by mouth daily.    . fluticasone (FLONASE) 50 MCG/ACT nasal spray Place 1-2 sprays into both nostrils daily as needed for allergies.    . fluticasone (FLONASE) 50 MCG/ACT nasal spray Place 2 sprays into both nostrils daily. 16 g 5  . glipiZIDE (GLUCOTROL) 10 MG tablet Take 10 mg by mouth 2 (two) times daily before a meal.    . metFORMIN (GLUCOPHAGE-XR) 500 MG 24 hr tablet Take 2,000 mg by mouth daily with breakfast.    . methocarbamol (ROBAXIN) 500 MG tablet Take 1 tablet (500 mg total) by mouth every 8 (eight) hours as needed for muscle spasms. 12 tablet 0  . oxyCODONE-acetaminophen (PERCOCET) 10-325 MG tablet Take 1-2 tablets by  mouth every 6 (six) hours as needed. For pain.    . pioglitazone (ACTOS) 15 MG tablet Take 15 mg by mouth daily.    . sildenafil (VIAGRA) 100 MG tablet Take 100 mg by mouth daily as needed for erectile dysfunction.    . traMADol (ULTRAM) 50 MG tablet Take by mouth every 6 (six) hours as needed.    . Vitamin D, Ergocalciferol, (DRISDOL) 50000 units CAPS capsule Take 50,000 Units by mouth every 7 (seven) days. (Patient not taking: Reported on 03/25/2020)     No current facility-administered  medications for this visit.   Allergies: No Known Allergies I reviewed his past medical history, social history, family history, and environmental history and no significant changes have been reported from his previous visit.  Review of Systems  Constitutional: Negative for appetite change, chills, fever and unexpected weight change.  HENT: Positive for congestion. Negative for rhinorrhea.   Eyes: Negative for itching.  Respiratory: Negative for cough, chest tightness, shortness of breath and wheezing.   Gastrointestinal: Negative for abdominal pain.  Skin: Negative for rash.  Allergic/Immunologic: Positive for environmental allergies.  Neurological: Negative for headaches.   Objective: BP 118/70 (BP Location: Right Arm, Patient Position: Sitting, Cuff Size: Large)   Pulse 84   Temp 98.6 F (37 C) (Temporal)   Resp 16   Wt 218 lb 12.8 oz (99.2 kg)   SpO2 96%   BMI 29.67 kg/m  Body mass index is 29.67 kg/m. Physical Exam Vitals and nursing note reviewed.  Constitutional:      Appearance: Normal appearance. He is well-developed.  HENT:     Head: Normocephalic and atraumatic.     Right Ear: Tympanic membrane and external ear normal.     Left Ear: Tympanic membrane and external ear normal.     Nose: Nose normal.     Mouth/Throat:     Mouth: Mucous membranes are moist.     Pharynx: Oropharynx is clear.  Eyes:     Conjunctiva/sclera: Conjunctivae normal.  Cardiovascular:     Rate and Rhythm: Normal rate and regular rhythm.     Heart sounds: Normal heart sounds. No murmur heard.   Pulmonary:     Effort: Pulmonary effort is normal.     Breath sounds: Normal breath sounds. No wheezing, rhonchi or rales.  Musculoskeletal:     Cervical back: Neck supple.  Skin:    General: Skin is warm.     Findings: No rash.  Neurological:     Mental Status: He is alert and oriented to person, place, and time.  Psychiatric:        Behavior: Behavior normal.    Previous notes and  tests were reviewed. The plan was reviewed with the patient/family, and all questions/concerned were addressed.  It was my pleasure to see Sarim today and participate in his care. Please feel free to contact me with any questions or concerns.  Sincerely,  Wyline Mood, DO Allergy & Immunology  [facility] office: [phone] [facility] office: [phone] [place] office: [phone]

## 2020-03-25 NOTE — Patient Instructions (Addendum)
Allergic rhinitis:  2021 skin testing was positive to grass, weed, cat, ragweed, tree, dust mites and cockroach.  Continue environmental control measures.  May use over the counter antihistamines such as Zyrtec (cetirizine), Claritin (loratadine), Allegra (fexofenadine), or Xyzal (levocetirizine) daily as needed.  Use azelastine nasal spray 1 spray per nostril twice a day for drainage.  Restart fluticasone nasal spray 1 spray per nostril twice a day for nasal congestion.  Read about allergy injections.  Follow up in 2 months or sooner if needed.  Reducing Pollen Exposure  Pollen seasons: trees (spring), grass (summer) and ragweed/weeds (fall).  Keep windows closed in your home and car to lower pollen exposure.   Install air conditioning in the bedroom and throughout the house if possible.   Avoid going out in dry windy days - especially early morning.  Pollen counts are highest between 5 - 10 AM and on dry, hot and windy days.   Save outside activities for late afternoon or after a heavy rain, when pollen levels are lower.   Avoid mowing of grass if you have grass pollen allergy.  Be aware that pollen can also be transported indoors on people and pets.   Dry your clothes in an automatic dryer rather than hanging them outside where they might collect pollen.   Rinse hair and eyes before bedtime. Control of House Dust Mite Allergen  Dust mite allergens are a common trigger of allergy and asthma symptoms. While they can be found throughout the house, these microscopic creatures thrive in warm, humid environments such as bedding, upholstered furniture and carpeting.  Because so much time is spent in the bedroom, it is essential to reduce mite levels there.   Encase pillows, mattresses, and box springs in special allergen-proof fabric covers or airtight, zippered plastic covers.   Bedding should be washed weekly in hot water (130 F) and dried in a hot dryer. Allergen-proof  covers are available for comforters and pillows that cant be regularly washed.   Wash the allergy-proof covers every few months. Minimize clutter in the bedroom. Keep pets out of the bedroom.   Keep humidity less than 50% by using a dehumidifier or air conditioning. You can buy a humidity measuring device called a hygrometer to monitor this.   If possible, replace carpets with hardwood, linoleum, or washable area rugs. If that's not possible, vacuum frequently with a vacuum that has a HEPA filter.  Remove all upholstered furniture and non-washable window drapes from the bedroom.  Remove all non-washable stuffed toys from the bedroom.  Wash stuffed toys weekly. Cockroach Allergen Avoidance Cockroaches are often found in the homes of densely populated urban areas, schools or commercial buildings, but these creatures can lurk almost anywhere. This does not mean that you have a dirty house or living area.  Block all areas where roaches can enter the home. This includes crevices, wall cracks and windows.   Cockroaches need water to survive, so fix and seal all leaky faucets and pipes. Have an exterminator go through the house when your family and pets are gone to eliminate any remaining roaches.  Keep food in lidded containers and put pet food dishes away after your pets are done eating. Vacuum and sweep the floor after meals, and take out garbage and recyclables. Use lidded garbage containers in the kitchen. Wash dishes immediately after use and clean under stoves, refrigerators or toasters where crumbs can accumulate. Wipe off the stove and other kitchen surfaces and cupboards regularly.

## 2020-05-28 NOTE — Progress Notes (Signed)
Follow Up Note  RE: John Fisher MRN: [id number] DOB: Dec 18, 1955 Date of Office Visit: 05/29/2020  Referring provider: Yvette Rack, MD Primary care provider: Yvette Rack, MD  Chief Complaint: Allergic Rhinitis   History of Present Illness: I had the pleasure of seeing John Fisher for a follow up visit at the [facility] on 05/29/2020. He is a 64 y.o. male, who is being followed for allergic rhinitis. His previous allergy office visit was on 03/25/2020 with Dr. Selena Batten. Today is a regular follow up visit.  Perennial and seasonal allergic rhinitis Currently on Flonase 2-3 sprays per nostril twice a day. No nosebleeds.  Still having nasal congestion and rhinorrhea.   Stopped azelastine nasal spray due to cost.  Not taking any antihistamines.  Would like to see ENT first before committing to allergy injections.   Assessment and Plan: John Fisher is a 64 y.o. male with: Perennial and seasonal allergic rhinitis Past history - 2021 skin testing was positive to grass, weed, cat, ragweed, tree, dust mites and cockroach. Interim history - still having nasal congestion but only using Flonase now. Wants ENT evaluation first before committing to allergy injections.  Continue environmental control measures.  May use over the counter antihistamines such as Zyrtec (cetirizine) 10mg  daily.   Fluticasone nasal spray 1-2 spray per nostril twice a day for nasal congestion.  Refer to ENT for nasal congestion.   Consider starting allergy injections.   Return in about 2 months (around 07/29/2020).  Meds ordered this encounter  Medications  . cetirizine (ZYRTEC) 10 MG tablet    Sig: Take 1 tablet (10 mg total) by mouth daily.    Dispense:  30 tablet    Refill:  5   Diagnostics: None.  Medication List:  Current Outpatient Medications  Medication Sig Dispense Refill  . amLODipine (NORVASC) 10 MG tablet Take 10 mg by mouth daily.    07/31/2020 aspirin EC 81 MG tablet  Take 81 mg by mouth daily.    Marland Kitchen atorvastatin (LIPITOR) 20 MG tablet Take 10 mg by mouth daily.    . fluticasone (FLONASE) 50 MCG/ACT nasal spray Place 2 sprays into both nostrils daily. 16 g 5  . metFORMIN (GLUCOPHAGE-XR) 500 MG 24 hr tablet Take 2,000 mg by mouth daily with breakfast.    . sildenafil (VIAGRA) 100 MG tablet Take 100 mg by mouth daily as needed for erectile dysfunction.    . Vitamin D, Ergocalciferol, (DRISDOL) 50000 units CAPS capsule Take 50,000 Units by mouth every 7 (seven) days.     . betamethasone valerate (VALISONE) 0.1 % cream Apply 1 application topically daily. Small amount applied to face daily (Patient not taking: Reported on 05/29/2020)    . cetirizine (ZYRTEC) 10 MG tablet Take 1 tablet (10 mg total) by mouth daily. 30 tablet 5  . oxyCODONE-acetaminophen (PERCOCET) 10-325 MG tablet Take 1-2 tablets by mouth every 6 (six) hours as needed. For pain. (Patient not taking: Reported on 05/29/2020)    . pioglitazone (ACTOS) 15 MG tablet Take 15 mg by mouth daily. (Patient not taking: Reported on 05/29/2020)    . traMADol (ULTRAM) 50 MG tablet Take by mouth every 6 (six) hours as needed. (Patient not taking: Reported on 05/29/2020)     No current facility-administered medications for this visit.   Allergies: No Known Allergies I reviewed his past medical history, social history, family history, and environmental history and no significant changes have been reported from his previous visit.  Review  of Systems  Constitutional: Negative for appetite change, chills, fever and unexpected weight change.  HENT: Positive for congestion. Negative for rhinorrhea.   Eyes: Negative for itching.  Respiratory: Negative for cough, chest tightness, shortness of breath and wheezing.   Gastrointestinal: Negative for abdominal pain.  Skin: Negative for rash.  Allergic/Immunologic: Positive for environmental allergies.  Neurological: Negative for headaches.   Objective: BP 98/70   Pulse  66   Temp 99.1 F (37.3 C) (Temporal)   Resp 16   SpO2 98%  There is no height or weight on file to calculate BMI. Physical Exam Vitals and nursing note reviewed.  Constitutional:      Appearance: Normal appearance. He is well-developed.  HENT:     Head: Normocephalic and atraumatic.     Right Ear: Tympanic membrane and external ear normal.     Left Ear: Tympanic membrane and external ear normal.     Nose: Nose normal.     Mouth/Throat:     Mouth: Mucous membranes are moist.     Pharynx: Oropharynx is clear.  Eyes:     Conjunctiva/sclera: Conjunctivae normal.  Cardiovascular:     Rate and Rhythm: Normal rate and regular rhythm.     Heart sounds: Normal heart sounds. No murmur heard.   Pulmonary:     Effort: Pulmonary effort is normal.     Breath sounds: Normal breath sounds. No wheezing, rhonchi or rales.  Musculoskeletal:     Cervical back: Neck supple.  Skin:    General: Skin is warm.     Findings: No rash.  Neurological:     Mental Status: He is alert and oriented to person, place, and time.  Psychiatric:        Behavior: Behavior normal.    Previous notes and tests were reviewed. The plan was reviewed with the patient/family, and all questions/concerned were addressed.  It was my pleasure to see John Fisher today and participate in his care. Please feel free to contact me with any questions or concerns.  Sincerely,  Wyline Mood, DO Allergy & Immunology  [facility] office: [phone] [facility] office: [phone] [place] office: [phone]

## 2020-05-29 ENCOUNTER — Telehealth: Payer: Self-pay

## 2020-05-29 ENCOUNTER — Ambulatory Visit (INDEPENDENT_AMBULATORY_CARE_PROVIDER_SITE_OTHER): Payer: No Typology Code available for payment source | Admitting: Allergy

## 2020-05-29 ENCOUNTER — Other Ambulatory Visit: Payer: Self-pay

## 2020-05-29 ENCOUNTER — Encounter: Payer: Self-pay | Admitting: Allergy

## 2020-05-29 VITALS — BP 98/70 | HR 66 | Temp 99.1°F | Resp 16

## 2020-05-29 DIAGNOSIS — J3089 Other allergic rhinitis: Secondary | ICD-10-CM | POA: Diagnosis not present

## 2020-05-29 MED ORDER — CETIRIZINE HCL 10 MG PO TABS: 10.0000 mg | ORAL_TABLET | Freq: Every day | ORAL | 5 refills | Status: DC

## 2020-05-29 NOTE — Assessment & Plan Note (Addendum)
Past history - 2021 skin testing was positive to grass, weed, cat, ragweed, tree, dust mites and cockroach. Interim history - still having nasal congestion but only using Flonase now. Wants ENT evaluation first before committing to allergy injections.  Continue environmental control measures.  May use over the counter antihistamines such as Zyrtec (cetirizine) 10mg  daily.   Fluticasone nasal spray 1-2 spray per nostril twice a day for nasal congestion.  Refer to ENT for nasal congestion.   Consider starting allergy injections.

## 2020-05-29 NOTE — Patient Instructions (Addendum)
Allergic rhinitis:  2021 skin testing was positive to grass, weed, cat, ragweed, tree, dust mites and cockroach.  Continue environmental control measures.  May use over the counter antihistamines such as Zyrtec (cetirizine) 10mg  daily.   Fluticasone nasal spray 1-2 spray per nostril twice a day for nasal congestion.  Refer to ENT for nasal congestion.   Consider starting allergy injections.   Follow up in 2-3 months or sooner if needed - after your ENT evaluation.  Reducing Pollen Exposure  Pollen seasons: trees (spring), grass (summer) and ragweed/weeds (fall).  Keep windows closed in your home and car to lower pollen exposure.   Install air conditioning in the bedroom and throughout the house if possible.   Avoid going out in dry windy days - especially early morning.  Pollen counts are highest between 5 - 10 AM and on dry, hot and windy days.   Save outside activities for late afternoon or after a heavy rain, when pollen levels are lower.   Avoid mowing of grass if you have grass pollen allergy.  Be aware that pollen can also be transported indoors on people and pets.   Dry your clothes in an automatic dryer rather than hanging them outside where they might collect pollen.   Rinse hair and eyes before bedtime. Control of House Dust Mite Allergen  Dust mite allergens are a common trigger of allergy and asthma symptoms. While they can be found throughout the house, these microscopic creatures thrive in warm, humid environments such as bedding, upholstered furniture and carpeting.  Because so much time is spent in the bedroom, it is essential to reduce mite levels there.   Encase pillows, mattresses, and box springs in special allergen-proof fabric covers or airtight, zippered plastic covers.   Bedding should be washed weekly in hot water (130 F) and dried in a hot dryer. Allergen-proof covers are available for comforters and pillows that cant be regularly washed.    Wash the allergy-proof covers every few months. Minimize clutter in the bedroom. Keep pets out of the bedroom.   Keep humidity less than 50% by using a dehumidifier or air conditioning. You can buy a humidity measuring device called a hygrometer to monitor this.   If possible, replace carpets with hardwood, linoleum, or washable area rugs. If that's not possible, vacuum frequently with a vacuum that has a HEPA filter.  Remove all upholstered furniture and non-washable window drapes from the bedroom.  Remove all non-washable stuffed toys from the bedroom.  Wash stuffed toys weekly. Cockroach Allergen Avoidance Cockroaches are often found in the homes of densely populated urban areas, schools or commercial buildings, but these creatures can lurk almost anywhere. This does not mean that you have a dirty house or living area.  Block all areas where roaches can enter the home. This includes crevices, wall cracks and windows.   Cockroaches need water to survive, so fix and seal all leaky faucets and pipes. Have an exterminator go through the house when your family and pets are gone to eliminate any remaining roaches.  Keep food in lidded containers and put pet food dishes away after your pets are done eating. Vacuum and sweep the floor after meals, and take out garbage and recyclables. Use lidded garbage containers in the kitchen. Wash dishes immediately after use and clean under stoves, refrigerators or toasters where crumbs can accumulate. Wipe off the stove and other kitchen surfaces and cupboards regularly.

## 2020-05-29 NOTE — Telephone Encounter (Signed)
Patient needs referral to ENT for nasal congestion. Thank You

## 2020-06-03 NOTE — Telephone Encounter (Signed)
Referral request placed to the [facility]. Their office will place the referral to an office that sees [facility]. I asked the patient did he want to use his regular insurance, patient states he would like to go through the [place] to cover his visit with the ENT.   Patient states he will give them a call if he doesn't hear anything within a week.

## 2020-08-28 ENCOUNTER — Ambulatory Visit: Payer: No Typology Code available for payment source | Admitting: Allergy

## 2020-09-11 ENCOUNTER — Encounter: Payer: Self-pay | Admitting: Allergy

## 2020-09-11 ENCOUNTER — Ambulatory Visit (INDEPENDENT_AMBULATORY_CARE_PROVIDER_SITE_OTHER): Payer: No Typology Code available for payment source | Admitting: Allergy

## 2020-09-11 ENCOUNTER — Other Ambulatory Visit: Payer: Self-pay

## 2020-09-11 ENCOUNTER — Encounter: Payer: Self-pay | Admitting: *Deleted

## 2020-09-11 VITALS — BP 150/88 | HR 73 | Temp 97.7°F | Resp 18 | Ht 72.0 in | Wt 208.8 lb

## 2020-09-11 DIAGNOSIS — J3089 Other allergic rhinitis: Secondary | ICD-10-CM

## 2020-09-11 MED ORDER — EPINEPHRINE 0.3 MG/0.3ML IJ SOAJ: 0.3000 mg | INTRAMUSCULAR | 2 refills | Status: DC | PRN

## 2020-09-11 MED ORDER — MONTELUKAST SODIUM 10 MG PO TABS: 10.0000 mg | ORAL_TABLET | Freq: Every day | ORAL | 5 refills | Status: DC

## 2020-09-11 MED ORDER — FLUTICASONE PROPIONATE 50 MCG/ACT NA SUSP: 1.0000 | Freq: Two times a day (BID) | NASAL | 5 refills | Status: AC | PRN

## 2020-09-11 NOTE — Assessment & Plan Note (Signed)
Past history - 2021 skin testing was positive to grass, weed, cat, ragweed, tree, dust mites and cockroach. Interim history - still having nasal congestion and using Flonase more than recommended. Saw ENT - records not available for review during OV, per patient no surgery needed.  Continue environmental control measures. Start Singulair (montelukast) 10mg  daily at night. Cautioned that in some children/adults can experience behavioral changes including hyperactivity, agitation, depression, sleep disturbances and suicidal ideations. These side effects are rare, but if you notice them you should notify me and discontinue Singulair (montelukast).  May use over the counter antihistamines such as Zyrtec (cetirizine) 10mg  daily.   Fluticasone nasal spray ONLY use 1 spray per nostril twice a day for nasal congestion.  Start allergy injections - this will be a good long term control for his symptoms.   Had a detailed discussion with patient/family that clinical history is suggestive of allergic rhinitis, and may benefit from allergy immunotherapy (AIT). Discussed in detail regarding the dosing, schedule, side effects (mild to moderate local allergic reaction and rarely systemic allergic reactions including anaphylaxis), and benefits (significant improvement in nasal symptoms, seasonal flares of asthma) of immunotherapy with the patient. There is significant time commitment involved with allergy shots, which includes weekly immunotherapy injections for first 9-12 months and then biweekly to monthly injections for 3-5 years. Consent was signed.  I have prescribed epinephrine injectable and demonstrated proper use. For mild symptoms you can take over the counter antihistamines such as Benadryl and monitor symptoms closely. If symptoms worsen or if you have severe symptoms including breathing issues, throat closure, significant swelling, whole body hives, severe diarrhea and vomiting, lightheadedness then inject  epinephrine and seek immediate medical care afterwards. Action plan given.

## 2020-09-11 NOTE — Progress Notes (Signed)
Aeroallergen Immunotherapy    Patient Details  Name: John Fisher  MRN: [id number]  Date of Birth: Aug 12, 1956   Order 1 of 2   Vial Label: G-T   0.3 ml (Volume) BAU Concentration -- 7 Grass Mix* 100,000 ([address] [place], [place], [place], Perennial [place], [place], Sweet Vernal, [place])  0.2 ml (Volume) 1:20 Concentration -- [place]  0.3 ml (Volume) BAU Concentration -- [place] 10,000  0.2 ml (Volume) 1:20 Concentration -- [place]  0.5 ml (Volume) 1:20 Concentration -- Eastern 10 Tree Mix (also Sweet Gum)  0.2 ml (Volume) 1:20 Concentration -- Box Elder  0.2 ml (Volume) 1:10 Concentration -- Cedar, red  0.2 ml (Volume) 1:10 Concentration -- Pecan Pollen  0.2 ml (Volume) 1:10 Concentration -- Pine Mix  0.2 ml (Volume) 1:20 Concentration -- Walnut, Black Pollen    2.5 ml Extract Subtotal  2.5 ml Diluent  5.0 ml Maintenance Total    Final Concentration above is stated in weight/volume (wt/vol). Allergen units (AU/ml) biological units (BAU/ml). The total volume is 5 ml.    Schedule: A   Special Instructions: once a week

## 2020-09-11 NOTE — Progress Notes (Signed)
Follow Up Note  RE: John Fisher MRN: [id number] DOB: 11/01/1955 Date of Office Visit: 09/11/2020  Referring provider: Yvette Rack, MD Primary care provider: Yvette Rack, MD  Chief Complaint: Allergic Rhinitis  (Patient says he see's no change and has gotten use to the flare ups. Says the nasal spray helps somewhat. Patient has not gotten the OTC allergy medication due to him already taking so many medications as is. Wants to discuss options with no medications.)  History of Present Illness: I had the pleasure of seeing John Fisher for a follow up visit at the [facility] on 09/11/2020. He is a 64 y.o. male, who is being followed for allergic rhinitis. His previous allergy office visit was on 05/29/2020 with Dr. Selena Batten. Today is a regular follow up visit.  Perennial and seasonal allergic rhinitis Patient saw ENT and not surgical candidate per patient report - records not available for review during visit.   Currently on Flonase 2 sprays and using 1-3 times a day for nasal congestion.  Sometimes he does not remember taking the zyrtec as he has to take so many other medications. He is not even sure if it's helping.  Assessment and Plan: John Fisher is a 64 y.o. male with: Perennial and seasonal allergic rhinitis Past history - 2021 skin testing was positive to grass, weed, cat, ragweed, tree, dust mites and cockroach. Interim history - still having nasal congestion and using Flonase more than recommended. Saw ENT - records not available for review during OV, per patient no surgery needed.  Continue environmental control measures. Start Singulair (montelukast) 10mg  daily at night. Cautioned that in some children/adults can experience behavioral changes including hyperactivity, agitation, depression, sleep disturbances and suicidal ideations. These side effects are rare, but if you notice them you should notify me and discontinue Singulair (montelukast).  May  use over the counter antihistamines such as Zyrtec (cetirizine) 10mg  daily.   Fluticasone nasal spray ONLY use 1 spray per nostril twice a day for nasal congestion.  Start allergy injections - this will be a good long term control for his symptoms.   Had a detailed discussion with patient/family that clinical history is suggestive of allergic rhinitis, and may benefit from allergy immunotherapy (AIT). Discussed in detail regarding the dosing, schedule, side effects (mild to moderate local allergic reaction and rarely systemic allergic reactions including anaphylaxis), and benefits (significant improvement in nasal symptoms, seasonal flares of asthma) of immunotherapy with the patient. There is significant time commitment involved with allergy shots, which includes weekly immunotherapy injections for first 9-12 months and then biweekly to monthly injections for 3-5 years. Consent was signed.  I have prescribed epinephrine injectable and demonstrated proper use. For mild symptoms you can take over the counter antihistamines such as Benadryl and monitor symptoms closely. If symptoms worsen or if you have severe symptoms including breathing issues, throat closure, significant swelling, whole body hives, severe diarrhea and vomiting, lightheadedness then inject epinephrine and seek immediate medical care afterwards. Action plan given.  Return in about 3 months (around 12/10/2020).  Meds ordered this encounter  Medications  . fluticasone (FLONASE) 50 MCG/ACT nasal spray    Sig: Place 1 spray into both nostrils 2 (two) times daily as needed for allergies or rhinitis.    Dispense:  16 g    Refill:  5  . montelukast (SINGULAIR) 10 MG tablet    Sig: Take 1 tablet (10 mg total) by mouth at bedtime.    Dispense:  30 tablet    Refill:  5  . EPINEPHrine 0.3 mg/0.3 mL IJ SOAJ injection    Sig: Inject 0.3 mg into the muscle as needed.    Dispense:  1 each    Refill:  2   Diagnostics: None.  Medication  List:  Current Outpatient Medications  Medication Sig Dispense Refill  . amLODipine (NORVASC) 10 MG tablet Take 10 mg by mouth daily.    Marland Kitchen aspirin EC 81 MG tablet Take 81 mg by mouth daily.    Marland Kitchen EPINEPHrine 0.3 mg/0.3 mL IJ SOAJ injection Inject 0.3 mg into the muscle as needed. 1 each 2  . metFORMIN (GLUCOPHAGE-XR) 500 MG 24 hr tablet Take 2,000 mg by mouth daily with breakfast.    . montelukast (SINGULAIR) 10 MG tablet Take 1 tablet (10 mg total) by mouth at bedtime. 30 tablet 5  . sildenafil (VIAGRA) 100 MG tablet Take 100 mg by mouth daily as needed for erectile dysfunction.    . tamsulosin (FLOMAX) 0.4 MG CAPS capsule Take 0.4 mg by mouth.    . traMADol (ULTRAM) 50 MG tablet Take by mouth every 6 (six) hours as needed.    . Vitamin D, Ergocalciferol, (DRISDOL) 50000 units CAPS capsule Take 50,000 Units by mouth every 7 (seven) days.     Marland Kitchen atorvastatin (LIPITOR) 20 MG tablet Take 10 mg by mouth daily. (Patient not taking: Reported on 09/11/2020)    . betamethasone valerate (VALISONE) 0.1 % cream Apply 1 application topically daily. Small amount applied to face daily (Patient not taking: No sig reported)    . cetirizine (ZYRTEC) 10 MG tablet Take 1 tablet (10 mg total) by mouth daily. (Patient not taking: Reported on 09/11/2020) 30 tablet 5  . fluticasone (FLONASE) 50 MCG/ACT nasal spray Place 1 spray into both nostrils 2 (two) times daily as needed for allergies or rhinitis. 16 g 5  . oxyCODONE-acetaminophen (PERCOCET) 10-325 MG tablet Take 1-2 tablets by mouth every 6 (six) hours as needed. For pain. (Patient not taking: No sig reported)    . pioglitazone (ACTOS) 15 MG tablet Take 15 mg by mouth daily. (Patient not taking: No sig reported)     No current facility-administered medications for this visit.   Allergies: No Known Allergies I reviewed his past medical history, social history, family history, and environmental history and no significant changes have been reported from his  previous visit.  Review of Systems  Constitutional: Negative for appetite change, chills, fever and unexpected weight change.  HENT: Positive for congestion. Negative for rhinorrhea.   Eyes: Negative for itching.  Respiratory: Negative for cough, chest tightness, shortness of breath and wheezing.   Gastrointestinal: Negative for abdominal pain.  Skin: Negative for rash.  Allergic/Immunologic: Positive for environmental allergies.  Neurological: Negative for headaches.   Objective: BP (!) 150/88   Pulse 73   Temp 97.7 F (36.5 C)   Resp 18   Ht 6' (1.829 m)   Wt 208 lb 12.8 oz (94.7 kg)   SpO2 98%   BMI 28.32 kg/m  Body mass index is 28.32 kg/m. Physical Exam Vitals and nursing note reviewed.  Constitutional:      Appearance: Normal appearance. He is well-developed.  HENT:     Head: Normocephalic and atraumatic.     Right Ear: Tympanic membrane and external ear normal.     Left Ear: Tympanic membrane and external ear normal.     Nose: Nose normal.     Mouth/Throat:     Mouth:  Mucous membranes are moist.     Pharynx: Oropharynx is clear.  Eyes:     Conjunctiva/sclera: Conjunctivae normal.  Cardiovascular:     Rate and Rhythm: Normal rate and regular rhythm.     Heart sounds: Normal heart sounds. No murmur heard.   Pulmonary:     Effort: Pulmonary effort is normal.     Breath sounds: Normal breath sounds. No wheezing, rhonchi or rales.  Musculoskeletal:     Cervical back: Neck supple.  Skin:    General: Skin is warm.     Findings: No rash.  Neurological:     Mental Status: He is alert and oriented to person, place, and time.  Psychiatric:        Behavior: Behavior normal.    Previous notes and tests were reviewed. The plan was reviewed with the patient/family, and all questions/concerned were addressed.  It was my pleasure to see John Fisher today and participate in his care. Please feel free to contact me with any questions or concerns.  Sincerely,  Wyline Mood,  DO Allergy & Immunology  [facility] office: [phone] [facility] office: [phone]

## 2020-09-11 NOTE — Progress Notes (Signed)
Aeroallergen Immunotherapy    Patient Details  Name: John Fisher  MRN: [id number]  Date of Birth: 1956/04/29   Order 2 of 2   Vial Label: [id number]   0.3 ml (Volume) 1:20 Concentration -- Ragweed Mix  0.5 ml (Volume) 1:20 Concentration -- Weed Mix*  0.5 ml (Volume) 1:10 Concentration -- Cat Hair  0.3 ml (Volume) 1:20 Concentration -- Cockroach, German  0.5 ml (Volume)  AU Concentration -- Mite Mix (DF 5,000 & DP 5,000)    2.1 ml Extract Subtotal  2.9 ml Diluent  5.0 ml Maintenance Total    Final Concentration above is stated in weight/volume (wt/vol). Allergen units (AU/ml) biological units (BAU/ml). The total volume is 5 ml.    Schedule: A   Special Instructions: once a week

## 2020-09-11 NOTE — Patient Instructions (Addendum)
Allergic rhinitis:  2021 skin testing was positive to grass, weed, cat, ragweed, tree, dust mites and cockroach.  Continue environmental control measures. Start Singulair (montelukast) 10mg  daily at night. Cautioned that in some children/adults can experience behavioral changes including hyperactivity, agitation, depression, sleep disturbances and suicidal ideations. These side effects are rare, but if you notice them you should notify me and discontinue Singulair (montelukast).  May use over the counter antihistamines such as Zyrtec (cetirizine) 10mg  daily.   Fluticasone nasal spray ONLY use 1 spray per nostril twice a day for nasal congestion.  Start allergy injections.  Had a detailed discussion with patient/family that clinical history is suggestive of allergic rhinitis, and may benefit from allergy immunotherapy (AIT). Discussed in detail regarding the dosing, schedule, side effects (mild to moderate local allergic reaction and rarely systemic allergic reactions including anaphylaxis), and benefits (significant improvement in nasal symptoms, seasonal flares of asthma) of immunotherapy with the patient. There is significant time commitment involved with allergy shots, which includes weekly immunotherapy injections for first 9-12 months and then biweekly to monthly injections for 3-5 years.   Consent was signed.  I have prescribed epinephrine injectable and demonstrated proper use. For mild symptoms you can take over the counter antihistamines such as Benadryl and monitor symptoms closely. If symptoms worsen or if you have severe symptoms including breathing issues, throat closure, significant swelling, whole body hives, severe diarrhea and vomiting, lightheadedness then inject epinephrine and seek immediate medical care afterwards. Action plan given.  Follow up in 3 months or sooner if needed.   Reducing Pollen Exposure  Pollen seasons: trees (spring), grass (summer) and ragweed/weeds  (fall).  Keep windows closed in your home and car to lower pollen exposure.   Install air conditioning in the bedroom and throughout the house if possible.   Avoid going out in dry windy days - especially early morning.  Pollen counts are highest between 5 - 10 AM and on dry, hot and windy days.   Save outside activities for late afternoon or after a heavy rain, when pollen levels are lower.   Avoid mowing of grass if you have grass pollen allergy.  Be aware that pollen can also be transported indoors on people and pets.   Dry your clothes in an automatic dryer rather than hanging them outside where they might collect pollen.   Rinse hair and eyes before bedtime. Control of House Dust Mite Allergen  Dust mite allergens are a common trigger of allergy and asthma symptoms. While they can be found throughout the house, these microscopic creatures thrive in warm, humid environments such as bedding, upholstered furniture and carpeting.  Because so much time is spent in the bedroom, it is essential to reduce mite levels there.   Encase pillows, mattresses, and box springs in special allergen-proof fabric covers or airtight, zippered plastic covers.   Bedding should be washed weekly in hot water (130 F) and dried in a hot dryer. Allergen-proof covers are available for comforters and pillows that cant be regularly washed.   Wash the allergy-proof covers every few months. Minimize clutter in the bedroom. Keep pets out of the bedroom.   Keep humidity less than 50% by using a dehumidifier or air conditioning. You can buy a humidity measuring device called a hygrometer to monitor this.   If possible, replace carpets with hardwood, linoleum, or washable area rugs. If that's not possible, vacuum frequently with a vacuum that has a HEPA filter.  Remove all upholstered furniture and non-washable window  drapes from the bedroom.  Remove all non-washable stuffed toys from the bedroom.  Wash  stuffed toys weekly. Cockroach Allergen Avoidance Cockroaches are often found in the homes of densely populated urban areas, schools or commercial buildings, but these creatures can lurk almost anywhere. This does not mean that you have a dirty house or living area.  Block all areas where roaches can enter the home. This includes crevices, wall cracks and windows.   Cockroaches need water to survive, so fix and seal all leaky faucets and pipes. Have an exterminator go through the house when your family and pets are gone to eliminate any remaining roaches.  Keep food in lidded containers and put pet food dishes away after your pets are done eating. Vacuum and sweep the floor after meals, and take out garbage and recyclables. Use lidded garbage containers in the kitchen. Wash dishes immediately after use and clean under stoves, refrigerators or toasters where crumbs can accumulate. Wipe off the stove and other kitchen surfaces and cupboards regularly.

## 2020-09-16 DIAGNOSIS — J301 Allergic rhinitis due to pollen: Secondary | ICD-10-CM

## 2020-09-16 NOTE — Progress Notes (Signed)
VIALS EXP 09-16-21.  REPRINT LABELS 

## 2020-09-16 NOTE — Progress Notes (Signed)
VIALS EXP 09-16-21 

## 2020-09-17 DIAGNOSIS — J3089 Other allergic rhinitis: Secondary | ICD-10-CM | POA: Diagnosis not present

## 2020-10-04 ENCOUNTER — Ambulatory Visit: Payer: No Typology Code available for payment source

## 2020-12-10 NOTE — Progress Notes (Deleted)
Follow Up Note  RE: RASOOL ROMMEL MRN: [id number] DOB: 16-Jun-1956 Date of Office Visit: 12/11/2020  Referring provider: Yvette Rack, MD Primary care provider: Yvette Rack, MD  Chief Complaint: No chief complaint on file.  History of Present Illness: I had the pleasure of seeing Tsugio Elison for a follow up visit at the [facility] on 12/10/2020. He is a 65 y.o. male, who is being followed for allergic rhinitis. His previous allergy office visit was on 09/11/2020 with Dr. Selena Batten. Today is a regular follow up visit.  Perennial and seasonal allergic rhinitis Past history - 2021 skin testing was positive to grass, weed, cat, ragweed, tree, dust mites and cockroach. Interim history - still having nasal congestion and using Flonase more than recommended. Saw ENT - records not available for review during OV, per patient no surgery needed.  Continue environmental control measures.  Start Singulair (montelukast) 10mg  daily at night.  Cautioned that in some children/adults can experience behavioral changes including hyperactivity, agitation, depression, sleep disturbances and suicidal ideations. These side effects are rare, but if you notice them you should notify me and discontinue Singulair (montelukast).  May use over the counter antihistamines such as Zyrtec (cetirizine) 10mg  daily.   Fluticasone nasal spray ONLY use 1 spray per nostril twice a day for nasal congestion.  Start allergy injections - this will be a good long term control for his symptoms.   Had a detailed discussion with patient/family that clinical history is suggestive of allergic rhinitis, and may benefit from allergy immunotherapy (AIT). Discussed in detail regarding the dosing, schedule, side effects (mild to moderate local allergic reaction and rarely systemic allergic reactions including anaphylaxis), and benefits (significant improvement in nasal symptoms, seasonal flares of asthma) of  immunotherapy with the patient. There is significant time commitment involved with allergy shots, which includes weekly immunotherapy injections for first 9-12 months and then biweekly to monthly injections for 3-5 years. Consent was signed.  I have prescribed epinephrine injectable and demonstrated proper use. For mild symptoms you can take over the counter antihistamines such as Benadryl and monitor symptoms closely. If symptoms worsen or if you have severe symptoms including breathing issues, throat closure, significant swelling, whole body hives, severe diarrhea and vomiting, lightheadedness then inject epinephrine and seek immediate medical care afterwards. Action plan given.  Return in about 3 months (around 12/10/2020).  Assessment and Plan: Rhett is a 65 y.o. male with: No problem-specific Assessment & Plan notes found for this encounter.  No follow-ups on file.  No orders of the defined types were placed in this encounter.  Lab Orders  No laboratory test(s) ordered today    Diagnostics: Spirometry:  Tracings reviewed. His effort: {Blank single:19197::"Good reproducible efforts.","It was hard to get consistent efforts and there is a question as to whether this reflects a maximal maneuver.","Poor effort, data can not be interpreted."} FVC: ***L FEV1: ***L, ***% predicted FEV1/FVC ratio: ***% Interpretation: {Blank single:19197::"Spirometry consistent with mild obstructive disease","Spirometry consistent with moderate obstructive disease","Spirometry consistent with severe obstructive disease","Spirometry consistent with possible restrictive disease","Spirometry consistent with mixed obstructive and restrictive disease","Spirometry uninterpretable due to technique","Spirometry consistent with normal pattern","No overt abnormalities noted given today's efforts"}.  Please see scanned spirometry results for details.  Skin Testing: {Blank single:19197::"Select foods","Environmental  allergy panel","Environmental allergy panel and select foods","Food allergy panel","None","Deferred due to recent antihistamines use"}. Positive test to: ***. Negative test to: ***.  Results discussed with patient/family.   Medication List:  Current Outpatient Medications  Medication Sig Dispense Refill  . amLODipine (NORVASC) 10 MG tablet Take 10 mg by mouth daily.    Marland Kitchen aspirin EC 81 MG tablet Take 81 mg by mouth daily.    Marland Kitchen atorvastatin (LIPITOR) 20 MG tablet Take 10 mg by mouth daily. (Patient not taking: Reported on 09/11/2020)    . betamethasone valerate (VALISONE) 0.1 % cream Apply 1 application topically daily. Small amount applied to face daily (Patient not taking: No sig reported)    . cetirizine (ZYRTEC) 10 MG tablet Take 1 tablet (10 mg total) by mouth daily. (Patient not taking: Reported on 09/11/2020) 30 tablet 5  . EPINEPHrine 0.3 mg/0.3 mL IJ SOAJ injection Inject 0.3 mg into the muscle as needed. 1 each 2  . fluticasone (FLONASE) 50 MCG/ACT nasal spray Place 1 spray into both nostrils 2 (two) times daily as needed for allergies or rhinitis. 16 g 5  . metFORMIN (GLUCOPHAGE-XR) 500 MG 24 hr tablet Take 2,000 mg by mouth daily with breakfast.    . montelukast (SINGULAIR) 10 MG tablet Take 1 tablet (10 mg total) by mouth at bedtime. 30 tablet 5  . oxyCODONE-acetaminophen (PERCOCET) 10-325 MG tablet Take 1-2 tablets by mouth every 6 (six) hours as needed. For pain. (Patient not taking: No sig reported)    . pioglitazone (ACTOS) 15 MG tablet Take 15 mg by mouth daily. (Patient not taking: No sig reported)    . sildenafil (VIAGRA) 100 MG tablet Take 100 mg by mouth daily as needed for erectile dysfunction.    . tamsulosin (FLOMAX) 0.4 MG CAPS capsule Take 0.4 mg by mouth.    . traMADol (ULTRAM) 50 MG tablet Take by mouth every 6 (six) hours as needed.    . Vitamin D, Ergocalciferol, (DRISDOL) 50000 units CAPS capsule Take 50,000 Units by mouth every 7 (seven) days.      No current  facility-administered medications for this visit.   Allergies: No Known Allergies I reviewed his past medical history, social history, family history, and environmental history and no significant changes have been reported from his previous visit.  Review of Systems  Constitutional: Negative for appetite change, chills, fever and unexpected weight change.  HENT: Positive for congestion. Negative for rhinorrhea.   Eyes: Negative for itching.  Respiratory: Negative for cough, chest tightness, shortness of breath and wheezing.   Gastrointestinal: Negative for abdominal pain.  Skin: Negative for rash.  Allergic/Immunologic: Positive for environmental allergies.  Neurological: Negative for headaches.   Objective: There were no vitals taken for this visit. There is no height or weight on file to calculate BMI. Physical Exam Vitals and nursing note reviewed.  Constitutional:      Appearance: Normal appearance. He is well-developed.  HENT:     Head: Normocephalic and atraumatic.     Right Ear: Tympanic membrane and external ear normal.     Left Ear: Tympanic membrane and external ear normal.     Nose: Nose normal.     Mouth/Throat:     Mouth: Mucous membranes are moist.     Pharynx: Oropharynx is clear.  Eyes:     Conjunctiva/sclera: Conjunctivae normal.  Cardiovascular:     Rate and Rhythm: Normal rate and regular rhythm.     Heart sounds: Normal heart sounds. No murmur heard.   Pulmonary:     Effort: Pulmonary effort is normal.     Breath sounds: Normal breath sounds. No wheezing, rhonchi or rales.  Musculoskeletal:     Cervical back: Neck supple.  Skin:  General: Skin is warm.     Findings: No rash.  Neurological:     Mental Status: He is alert and oriented to person, place, and time.  Psychiatric:        Behavior: Behavior normal.    Previous notes and tests were reviewed. The plan was reviewed with the patient/family, and all questions/concerned were  addressed.  It was my pleasure to see Kaci today and participate in his care. Please feel free to contact me with any questions or concerns.  Sincerely,  Wyline Mood, DO Allergy & Immunology  [facility] office: [phone] [facility] office: [phone]

## 2020-12-11 ENCOUNTER — Ambulatory Visit: Payer: No Typology Code available for payment source | Admitting: Allergy

## 2020-12-11 DIAGNOSIS — J3089 Other allergic rhinitis: Secondary | ICD-10-CM

## 2020-12-12 ENCOUNTER — Telehealth: Payer: Self-pay | Admitting: Allergy

## 2020-12-12 NOTE — Telephone Encounter (Signed)
Request for new authorization faxed to [facility], [phone]. 

## 2021-01-01 NOTE — Telephone Encounter (Signed)
Spoke with patient regarding VA authorization. Patient states his PCP with the [facility] quit and he is unable to see another provider with the [place] until late September or October. Patient is going to hold off on starting injections and all appointments until [place] authorization can be placed. Informed patient we will check in around September with an update.

## 2021-06-09 NOTE — Telephone Encounter (Signed)
Contacted patient to see if he was able to get in with a new provider at the [place]. Patient states he does have a new provider however his new provider is making him go through a lot of tests. Patient would like to hold off on authorization until Jan. of next year. I will follow up then.

## 2021-10-15 ENCOUNTER — Emergency Department (HOSPITAL_COMMUNITY)
Admission: EM | Admit: 2021-10-15 | Discharge: 2021-10-15 | Disposition: A | Discharge status: Home / Self Care | Payer: No Typology Code available for payment source | Attending: Emergency Medicine | Admitting: Emergency Medicine

## 2021-10-15 ENCOUNTER — Encounter (HOSPITAL_COMMUNITY): Payer: Self-pay | Admitting: *Deleted

## 2021-10-15 DIAGNOSIS — Z79899 Other long term (current) drug therapy: Secondary | ICD-10-CM | POA: Diagnosis not present

## 2021-10-15 DIAGNOSIS — Z7984 Long term (current) use of oral hypoglycemic drugs: Secondary | ICD-10-CM | POA: Insufficient documentation

## 2021-10-15 DIAGNOSIS — K047 Periapical abscess without sinus: Secondary | ICD-10-CM | POA: Diagnosis not present

## 2021-10-15 DIAGNOSIS — Z7982 Long term (current) use of aspirin: Secondary | ICD-10-CM | POA: Diagnosis not present

## 2021-10-15 DIAGNOSIS — K029 Dental caries, unspecified: Secondary | ICD-10-CM | POA: Insufficient documentation

## 2021-10-15 DIAGNOSIS — K0889 Other specified disorders of teeth and supporting structures: Secondary | ICD-10-CM | POA: Diagnosis present

## 2021-10-15 LAB — CBG MONITORING, ED: Glucose-Capillary: 130 mg/dL — ABNORMAL HIGH (ref 70–99)

## 2021-10-15 MED ORDER — PENICILLIN V POTASSIUM 500 MG PO TABS: 500.0000 mg | ORAL_TABLET | Freq: Four times a day (QID) | ORAL | 0 refills | Status: AC

## 2021-10-15 MED ORDER — PENICILLIN V POTASSIUM 250 MG PO TABS
500.0000 mg | ORAL_TABLET | Freq: Once | ORAL | Status: AC
  Administered 2021-10-15: 500 mg via ORAL
  Filled 2021-10-15: qty 2

## 2021-10-15 MED ORDER — IBUPROFEN 800 MG PO TABS
800.0000 mg | ORAL_TABLET | Freq: Once | ORAL | Status: AC
  Administered 2021-10-15: 800 mg via ORAL
  Filled 2021-10-15: qty 1

## 2021-10-15 NOTE — ED Provider Notes (Signed)
[facility] DEPARTMENT Provider Note   CSN: [id number] Arrival date & time: 10/15/21  1218     History  Chief Complaint  Patient presents with   Dental Pain    John Fisher is a 66 y.o. male who presents to the ED today with complaint of gradual onset, constant, sharp, left lower dental pain that began 2 days ago.  Patient states he called the [facility] who told him to come to the ED for further evaluation.  He does not have a dentist to follow-up with.  He states he has not tried anything for pain at home.  He states the pain is worsened whenever he puts pressure on the specific tooth and states that he cannot close his mouth due to the discomfort.  He denies any fevers or chills.  Patient is very concerned that his blood sugars are running low as well as he has been here for 3 hours and has not had anything to eat or drink.  He has no other complaints at this time.  The history is provided by the patient and medical records.      Home Medications Prior to Admission medications   Medication Sig Start Date End Date Taking? Authorizing Provider  penicillin v potassium (VEETID) 500 MG tablet Take 1 tablet (500 mg total) by mouth 4 (four) times daily for 7 days. 10/15/21 10/22/21 Yes Peola Joynt, PA-C  amLODipine (NORVASC) 10 MG tablet Take 10 mg by mouth daily.    [provider]  aspirin EC 81 MG tablet Take 81 mg by mouth daily.    [provider]  atorvastatin (LIPITOR) 20 MG tablet Take 10 mg by mouth daily. Patient not taking: Reported on 09/11/2020    [provider]  betamethasone valerate (VALISONE) 0.1 % cream Apply 1 application topically daily. Small amount applied to face daily Patient not taking: No sig reported    [provider]  cetirizine (ZYRTEC) 10 MG tablet Take 1 tablet (10 mg total) by mouth daily. Patient not taking: Reported on 09/11/2020 05/29/20   Garnet Sierras, DO  EPINEPHrine 0.3 mg/0.3 mL IJ SOAJ  injection Inject 0.3 mg into the muscle as needed. 09/11/20   Garnet Sierras, DO  fluticasone (FLONASE) 50 MCG/ACT nasal spray Place 1 spray into both nostrils 2 (two) times daily as needed for allergies or rhinitis. 09/11/20   Garnet Sierras, DO  metFORMIN (GLUCOPHAGE-XR) 500 MG 24 hr tablet Take 2,000 mg by mouth daily with breakfast.    [provider]  montelukast (SINGULAIR) 10 MG tablet Take 1 tablet (10 mg total) by mouth at bedtime. 09/11/20   Garnet Sierras, DO  oxyCODONE-acetaminophen (PERCOCET) 10-325 MG tablet Take 1-2 tablets by mouth every 6 (six) hours as needed. For pain. Patient not taking: No sig reported 07/11/10   [provider]  pioglitazone (ACTOS) 15 MG tablet Take 15 mg by mouth daily. Patient not taking: No sig reported    [provider]  sildenafil (VIAGRA) 100 MG tablet Take 100 mg by mouth daily as needed for erectile dysfunction.    [provider]  tamsulosin (FLOMAX) 0.4 MG CAPS capsule Take 0.4 mg by mouth.    [provider]  traMADol (ULTRAM) 50 MG tablet Take by mouth every 6 (six) hours as needed.    [provider]  Vitamin D, Ergocalciferol, (DRISDOL) 50000 units CAPS capsule Take 50,000 Units by mouth every 7 (seven) days.  [provider]      Allergies    Patient has no known allergies.    Review of Systems   Review of Systems  Constitutional:  Negative for chills and fever.  HENT:  Positive for dental problem. Negative for facial swelling.   All other systems reviewed and are negative.  Physical Exam Updated Vital Signs BP 131/89 (BP Location: Right Arm)    Pulse (!) 117    Temp 99.1 F (37.3 C) (Oral)    Resp 16    SpO2 100%  Physical Exam Vitals and nursing note reviewed.  Constitutional:      Appearance: He is not ill-appearing.  HENT:     Head: Normocephalic and atraumatic.     Mouth/Throat:     Comments: Nose clear.  L lower tooth #21 decayed with caries with  TTP, with minimal  surrounding gingival swelling and erythema, no definite abscess, no evidence of ludwig's. Remainder of oral exam with multiple missing teeth. Oropharynx clear and moist, without uvular swelling or deviation, no trismus or drooling, no tonsillar swelling or erythema, no exudates.    Eyes:     Conjunctiva/sclera: Conjunctivae normal.  Cardiovascular:     Rate and Rhythm: Normal rate and regular rhythm.  Pulmonary:     Effort: Pulmonary effort is normal.     Breath sounds: Normal breath sounds.  Skin:    General: Skin is warm and dry.     Coloration: Skin is not jaundiced.  Neurological:     Mental Status: He is alert.    ED Results / Procedures / Treatments   Labs (all labs ordered are listed, but only abnormal results are displayed) Labs Reviewed  CBG MONITORING, ED - Abnormal; Notable for the following components:      Result Value   Glucose-Capillary 130 (*)    All other components within normal limits    EKG None  Radiology No results found.  Procedures Procedures    Medications Ordered in ED Medications  ibuprofen (ADVIL) tablet 800 mg (has no administration in time range)  penicillin v potassium (VEETID) tablet 500 mg (has no administration in time range)    ED Course/ Medical Decision Making/ A&P                           Medical Decision Making 66 year old male who presents to the ED today with atraumatic left lower dental pain for the past 2 days.  Does not have a dentist.  On arrival to the ED patient is slightly tachycardic at 117 however this is dissipated during exam.  Remainder vitals are unremarkable.  He is noted to have multiple missing teeth throughout.  He has dental caries and tenderness palpation to tooth #21 on the left lower side.  There is no obvious drainable abscess.  No concern for Ludwig's angina.  Patient will require antibiotics at this time and further evaluation with dentist.  He seems dissatisfied as we are not dentist an I have made him  aware do not pull teeth in the emergency department.  He was provided ibuprofen and 1 dose of antibiotic here as well as discharged home with same.  Reexamination patient also reports concerned that his blood sugar is low as he has been in the ER for 3 hours.  CBG 130.  Has been provided with something to drink while in the ED.  He is stable for discharge otherwise.  Problems Addressed: Dental infection: acute illness  or injury  Risk Diagnosis or treatment significantly limited by social determinants of health. Risk Details: Treatment limited as pt does not have an established dentist.            Final Clinical Impression(s) / ED Diagnoses Final diagnoses:  Tooth pain  Dental infection    Rx / DC Orders ED Discharge Orders          Ordered    penicillin v potassium (VEETID) 500 MG tablet  4 times daily        10/15/21 1514             Discharge Instructions      Please pick up antibiotics and take as prescribed to cover for infection.   Attached is a list of dental resources in the area. You can also try to follow up with Dr. Haig Prophet (information attached).   Take Ibuprofen and Tylenol as needed for pain. You can also apply OTC orajel to help with the discomfort.   Return to the ED for any new/worsening symptoms        Eustaquio Maize, Hershal Coria 10/15/21 1517    Davonna Belling, MD 10/16/21 [phone]

## 2021-10-15 NOTE — Discharge Instructions (Addendum)
Please pick up antibiotics and take as prescribed to cover for infection.   Attached is a list of dental resources in the area. You can also try to follow up with Dr. Mia Creek (information attached).   Take Ibuprofen and Tylenol as needed for pain. You can also apply OTC orajel to help with the discomfort.   Return to the ED for any new/worsening symptoms

## 2021-10-15 NOTE — ED Provider Triage Note (Signed)
Emergency Medicine Provider Triage Evaluation Note  CHUCKY HOMES , a 66 y.o. male  was evaluated in triage.  Pt complains of 2 days of left lower jaw pain, some subjective swelling.  No neck pain or difficulty swallowing.  Review of Systems  Positive: Dental pain Negative: Fever  Physical Exam  BP 131/89 (BP Location: Right Arm)    Pulse (!) 117    Temp 99.1 F (37.3 C) (Oral)    Resp 16    SpO2 100%  Gen:   Awake, no distress   Resp:  Normal effort  MSK:   Moves extremities without difficulty  Other:  Multiple missing teeth, no large dental abscess noted, mild generalized swelling of the gums around tooth 24-25  Medical Decision Making  Medically screening exam initiated at 1:14 PM.  Appropriate orders placed.  GARY GABRIELSEN was informed that the remainder of the evaluation will be completed by another provider, this initial triage assessment does not replace that evaluation, and the importance of remaining in the ED until their evaluation is complete.    Renne Crigler, PA-C 10/15/21 1315

## 2021-10-15 NOTE — ED Triage Notes (Signed)
To ED for eval of left lower tooth pain. Started last night. Tooth appears to be intact. Called the [facility] and told to come to the ED

## 2022-02-17 ENCOUNTER — Emergency Department (HOSPITAL_COMMUNITY)
Admission: EM | Admit: 2022-02-17 | Discharge: 2022-02-17 | Disposition: A | Discharge status: Home / Self Care | Payer: No Typology Code available for payment source | Attending: Student | Admitting: Student

## 2022-02-17 ENCOUNTER — Emergency Department (HOSPITAL_COMMUNITY): Payer: No Typology Code available for payment source

## 2022-02-17 ENCOUNTER — Other Ambulatory Visit: Payer: Self-pay

## 2022-02-17 ENCOUNTER — Encounter (HOSPITAL_COMMUNITY): Payer: Self-pay

## 2022-02-17 DIAGNOSIS — Z79899 Other long term (current) drug therapy: Secondary | ICD-10-CM | POA: Insufficient documentation

## 2022-02-17 DIAGNOSIS — I10 Essential (primary) hypertension: Secondary | ICD-10-CM | POA: Insufficient documentation

## 2022-02-17 DIAGNOSIS — X58XXXA Exposure to other specified factors, initial encounter: Secondary | ICD-10-CM | POA: Diagnosis not present

## 2022-02-17 DIAGNOSIS — R55 Syncope and collapse: Secondary | ICD-10-CM | POA: Diagnosis not present

## 2022-02-17 DIAGNOSIS — Z7982 Long term (current) use of aspirin: Secondary | ICD-10-CM | POA: Insufficient documentation

## 2022-02-17 DIAGNOSIS — E119 Type 2 diabetes mellitus without complications: Secondary | ICD-10-CM | POA: Diagnosis not present

## 2022-02-17 DIAGNOSIS — R911 Solitary pulmonary nodule: Secondary | ICD-10-CM | POA: Insufficient documentation

## 2022-02-17 DIAGNOSIS — S0591XA Unspecified injury of right eye and orbit, initial encounter: Secondary | ICD-10-CM | POA: Diagnosis present

## 2022-02-17 DIAGNOSIS — Z7984 Long term (current) use of oral hypoglycemic drugs: Secondary | ICD-10-CM | POA: Insufficient documentation

## 2022-02-17 DIAGNOSIS — S01111A Laceration without foreign body of right eyelid and periocular area, initial encounter: Secondary | ICD-10-CM | POA: Diagnosis not present

## 2022-02-17 LAB — CBC WITH DIFFERENTIAL/PLATELET
Abs Immature Granulocytes: 0.05 10*3/uL (ref 0.00–0.07)
Basophils Absolute: 0 10*3/uL (ref 0.0–0.1)
Basophils Relative: 0 %
Eosinophils Absolute: 0 10*3/uL (ref 0.0–0.5)
Eosinophils Relative: 0 %
HCT: 31.2 % — ABNORMAL LOW (ref 39.0–52.0)
Hemoglobin: 10.4 g/dL — ABNORMAL LOW (ref 13.0–17.0)
Immature Granulocytes: 1 %
Lymphocytes Relative: 18 %
Lymphs Abs: 1.2 10*3/uL (ref 0.7–4.0)
MCH: 36.5 pg — ABNORMAL HIGH (ref 26.0–34.0)
MCHC: 33.3 g/dL (ref 30.0–36.0)
MCV: 109.5 fL — ABNORMAL HIGH (ref 80.0–100.0)
Monocytes Absolute: 0.5 10*3/uL (ref 0.1–1.0)
Monocytes Relative: 8 %
Neutro Abs: 4.9 10*3/uL (ref 1.7–7.7)
Neutrophils Relative %: 73 %
Platelets: 213 10*3/uL (ref 150–400)
RBC: 2.85 MIL/uL — ABNORMAL LOW (ref 4.22–5.81)
RDW: 14.2 % (ref 11.5–15.5)
WBC: 6.6 10*3/uL (ref 4.0–10.5)
nRBC: 0 % (ref 0.0–0.2)

## 2022-02-17 LAB — COMPREHENSIVE METABOLIC PANEL
ALT: 42 U/L (ref 0–44)
AST: 59 U/L — ABNORMAL HIGH (ref 15–41)
Albumin: 3.4 g/dL — ABNORMAL LOW (ref 3.5–5.0)
Alkaline Phosphatase: 49 U/L (ref 38–126)
Anion gap: 11 (ref 5–15)
BUN: 15 mg/dL (ref 8–23)
CO2: 21 mmol/L — ABNORMAL LOW (ref 22–32)
Calcium: 9.8 mg/dL (ref 8.9–10.3)
Chloride: 108 mmol/L (ref 98–111)
Creatinine, Ser: 1.64 mg/dL — ABNORMAL HIGH (ref 0.61–1.24)
GFR, Estimated: 46 mL/min — ABNORMAL LOW (ref >60)
Glucose, Bld: 217 mg/dL — ABNORMAL HIGH (ref 70–99)
Potassium: 4.9 mmol/L (ref 3.5–5.1)
Sodium: 140 mmol/L (ref 135–145)
Total Bilirubin: 0.6 mg/dL (ref 0.3–1.2)
Total Protein: 6.6 g/dL (ref 6.5–8.1)

## 2022-02-17 LAB — D-DIMER, QUANTITATIVE: D-Dimer, Quant: 1.29 ug/mL-FEU — ABNORMAL HIGH (ref 0.00–0.50)

## 2022-02-17 LAB — TROPONIN I (HIGH SENSITIVITY): Troponin I (High Sensitivity): 16 ng/L (ref <18)

## 2022-02-17 MED ORDER — ENSURE ENLIVE PO LIQD
237.0000 mL | Freq: Two times a day (BID) | ORAL | Status: DC
  Administered 2022-02-17: 237 mL via ORAL
  Filled 2022-02-17 (×3): qty 237

## 2022-02-17 MED ORDER — SODIUM CHLORIDE 0.9 % IV BOLUS
1000.0000 mL | Freq: Once | INTRAVENOUS | Status: AC
  Administered 2022-02-17: 1000 mL via INTRAVENOUS

## 2022-02-17 MED ORDER — LIDOCAINE-EPINEPHRINE (PF) 2 %-1:200000 IJ SOLN
10.0000 mL | Freq: Once | INTRAMUSCULAR | Status: AC
  Administered 2022-02-17: 10 mL
  Filled 2022-02-17: qty 20

## 2022-02-17 MED ORDER — IOHEXOL 350 MG/ML SOLN
75.0000 mL | Freq: Once | INTRAVENOUS | Status: AC | PRN
  Administered 2022-02-17: 75 mL via INTRAVENOUS

## 2022-02-17 NOTE — ED Notes (Signed)
Pharmacy still has not sent ensure

## 2022-02-17 NOTE — ED Provider Triage Note (Signed)
Emergency Medicine Provider Triage Evaluation Note  STEVENSON WINDMILLER , a 66 y.o. male  was evaluated in triage.  Pt complains of laceration above right eye after a fall last night.  Patient states that he stood up from a seated position around 9:00 last night when he felt dizzy and fell to the ground striking his head.  He states that this is a common occurrence for him where he feels dizzy upon standing almost on a daily basis.  Secondarily endorses neck pain.  States that he was on the ground 45 minutes to an hour after the event until he is able to get up.  Patient notes increasing difficulty ambulating from baseline this morning.  Denies musculoskeletal pain elsewhere.  Denies abdominal/chest pain, shortness of breath, nausea/vomiting, headache, lightheadedness, loss of consciousness.  Denies blood thinner use.  Review of Systems  Positive: See above Negative:   Physical Exam  BP 121/75 (BP Location: Right Arm)   Pulse (!) 108   Temp 99.3 F (37.4 C) (Oral)   Resp 20   Ht 6' (1.829 m)   Wt 81.2 kg   SpO2 99%   BMI 24.28 kg/m  Gen:   Awake, no distress   Resp:  Normal effort  MSK:   Moves extremities without difficulty Other:  Cranial nerves III through XII grossly intact.  Pupils equal reactive to light with accommodation bilaterally.  Muscle strength 5-5 for upper and lower extremities.  No sensory deficits along major nerve distributions of upper or lower extremities.  No midline tenderness of C-spine.  Medical Decision Making  Medically screening exam initiated at 2:05 PM.  Appropriate orders placed.  CASSANDRA MCMANAMAN was informed that the remainder of the evaluation will be completed by another provider, this initial triage assessment does not replace that evaluation, and the importance of remaining in the ED until their evaluation is complete.     Peter Garter, [place] 02/17/22 1407

## 2022-02-17 NOTE — ED Provider Notes (Signed)
[facility] EMERGENCY DEPARTMENT Provider Note   CSN: [id number] Arrival date & time: 02/17/22  1325     History  Chief Complaint  Patient presents with   Loss of Consciousness    John Fisher is a 66 y.o. male.  HPI Patient is a 66 year old male with a history of diabetes mellitus, hypertension, hyperlipidemia, syncope, who presents to the emergency department due to a syncopal episode that occurred last night.  Patient states that he has been having these episodes 2-3 times per week for the past year.  States that he has dental pain and due to this has difficulty getting much p.o. intake during the day.  He will typically feel lightheaded and will then sit down and his symptoms will improve.  Last night he was walking in the kitchen to get something to drink and became lightheaded, briefly syncopized, and struck a linoleum surface.  Reports a laceration above the right eyebrow.  Reports chronic shortness of breath that has not acutely worsened.  Denies any chest pain, abdominal pain, nausea, vomiting.    Home Medications Prior to Admission medications   Medication Sig Start Date End Date Taking? Authorizing Provider  amLODipine (NORVASC) 10 MG tablet Take 10 mg by mouth daily.    [provider]  aspirin EC 81 MG tablet Take 81 mg by mouth daily.    [provider]  atorvastatin (LIPITOR) 20 MG tablet Take 10 mg by mouth daily. Patient not taking: Reported on 09/11/2020    [provider]  betamethasone valerate (VALISONE) 0.1 % cream Apply 1 application topically daily. Small amount applied to face daily Patient not taking: No sig reported    [provider]  cetirizine (ZYRTEC) 10 MG tablet Take 1 tablet (10 mg total) by mouth daily. Patient not taking: Reported on 09/11/2020 05/29/20   Ellamae Sia, DO  EPINEPHrine 0.3 mg/0.3 mL IJ SOAJ injection Inject 0.3 mg into the muscle as needed. 09/11/20   Ellamae Sia, DO  fluticasone  (FLONASE) 50 MCG/ACT nasal spray Place 1 spray into both nostrils 2 (two) times daily as needed for allergies or rhinitis. 09/11/20   Ellamae Sia, DO  metFORMIN (GLUCOPHAGE-XR) 500 MG 24 hr tablet Take 2,000 mg by mouth daily with breakfast.    [provider]  montelukast (SINGULAIR) 10 MG tablet Take 1 tablet (10 mg total) by mouth at bedtime. 09/11/20   Ellamae Sia, DO  oxyCODONE-acetaminophen (PERCOCET) 10-325 MG tablet Take 1-2 tablets by mouth every 6 (six) hours as needed. For pain. Patient not taking: No sig reported 07/11/10   [provider]  pioglitazone (ACTOS) 15 MG tablet Take 15 mg by mouth daily. Patient not taking: No sig reported    [provider]  sildenafil (VIAGRA) 100 MG tablet Take 100 mg by mouth daily as needed for erectile dysfunction.    [provider]  tamsulosin (FLOMAX) 0.4 MG CAPS capsule Take 0.4 mg by mouth.    [provider]  traMADol (ULTRAM) 50 MG tablet Take by mouth every 6 (six) hours as needed.    [provider]  Vitamin D, Ergocalciferol, (DRISDOL) 50000 units CAPS capsule Take 50,000 Units by mouth every 7 (seven) days.     [provider]      Allergies    Patient has no known allergies.    Review of Systems   Review of Systems  All other systems reviewed and are negative. Ten systems reviewed and  are negative for acute change, except as noted in the HPI.   Physical Exam Updated Vital Signs BP 136/90 (BP Location: Right Arm)   Pulse 96   Temp 98.3 F (36.8 C) (Oral)   Resp 16   Ht 6' (1.829 m)   Wt 81.2 kg   SpO2 100%   BMI 24.28 kg/m  Physical Exam Vitals and nursing note reviewed.  Constitutional:      General: He is not in acute distress.    Appearance: Normal appearance. He is not ill-appearing, toxic-appearing or diaphoretic.  HENT:     Head: Normocephalic.     Comments: 4 cm x-shaped laceration noted above the right eyebrow.  No active bleeding.    Right Ear:  External ear normal.     Left Ear: External ear normal.     Nose: Nose normal.     Mouth/Throat:     Mouth: Mucous membranes are moist.     Pharynx: Oropharynx is clear. No oropharyngeal exudate or posterior oropharyngeal erythema.  Eyes:     Extraocular Movements: Extraocular movements intact.  Neck:     Comments: No midline C, T, or L-spine tenderness. Cardiovascular:     Rate and Rhythm: Normal rate and regular rhythm.     Pulses: Normal pulses.     Heart sounds: Normal heart sounds. No murmur heard.   No friction rub. No gallop.  Pulmonary:     Effort: Pulmonary effort is normal. No respiratory distress.     Breath sounds: Normal breath sounds. No stridor. No wheezing, rhonchi or rales.  Abdominal:     General: Abdomen is flat.     Palpations: Abdomen is soft.     Tenderness: There is no abdominal tenderness.     Comments: Abdomen is soft and nontender.  Musculoskeletal:        General: Normal range of motion.     Cervical back: Normal range of motion and neck supple. No tenderness.  Skin:    General: Skin is warm and dry.  Neurological:     General: No focal deficit present.     Mental Status: He is alert and oriented to person, place, and time.     Comments: Patient is oriented to person, place, and time. Patient phonates in clear, complete, and coherent sentences. Strength is 5/5 in all four extremities. Distal sensation intact in all four extremities.  Psychiatric:        Mood and Affect: Mood normal.        Behavior: Behavior normal.   ED Results / Procedures / Treatments   Labs (all labs ordered are listed, but only abnormal results are displayed) Labs Reviewed  COMPREHENSIVE METABOLIC PANEL - Abnormal; Notable for the following components:      Result Value   CO2 21 (*)    Glucose, Bld 217 (*)    Creatinine, Ser 1.64 (*)    Albumin 3.4 (*)    AST 59 (*)    GFR, Estimated 46 (*)    All other components within normal limits  CBC WITH DIFFERENTIAL/PLATELET -  Abnormal; Notable for the following components:   RBC 2.85 (*)    Hemoglobin 10.4 (*)    HCT 31.2 (*)    MCV 109.5 (*)    MCH 36.5 (*)    All other components within normal limits  D-DIMER, QUANTITATIVE - Abnormal; Notable for the following components:   D-Dimer, Quant 1.29 (*)    All other components within normal limits  TROPONIN I (  HIGH SENSITIVITY)  TROPONIN I (HIGH SENSITIVITY)   EKG None  Radiology CT Head Wo Contrast  Result Date: 02/17/2022 CLINICAL DATA:  Head trauma, minor (Age >= 65y); Neck trauma (Age >= 65y) EXAM: CT HEAD WITHOUT CONTRAST CT CERVICAL SPINE WITHOUT CONTRAST TECHNIQUE: Multidetector CT imaging of the head and cervical spine was performed following the standard protocol without intravenous contrast. Multiplanar CT image reconstructions of the cervical spine were also generated. RADIATION DOSE REDUCTION: This exam was performed according to the departmental dose-optimization program which includes automated exposure control, adjustment of the mA and/or kV according to patient size and/or use of iterative reconstruction technique. COMPARISON:  None Available. FINDINGS: CT HEAD FINDINGS Brain: No evidence of acute infarction, hemorrhage, hydrocephalus, extra-axial collection or mass lesion/mass effect. Patchy white matter hypodensities, nonspecific but compatible with chronic microvascular ischemic disease. Partially empty sella. Vascular: No evidence of hyperdense vessel. Skull: No acute fracture. Sinuses/Orbits: Small retention cysts in the maxillary sinuses. Otherwise clear sinuses. Other: No mastoid effusions. CT CERVICAL SPINE FINDINGS Alignment: Straightening of the normal cervical lordosis. No substantial sagittal subluxation. Skull base and vertebrae: Vertebral body heights are maintained. No evidence of acute fracture. Soft tissues and spinal canal: Visualized lung apices are clear. Disc levels: Moderate multilevel degenerative change including disc height loss and  endplate spurring. Multilevel facet and uncovertebral hypertrophy with varying degrees of neural foraminal stenosis. Upper chest: Visualized lung apices are clear IMPRESSION: 1. No evidence of acute intracranial abnormality. 2. No evidence of acute fracture or traumatic malalignment. Electronically Signed   By: Feliberto HartsFrederick S Jones M.D.   On: 02/17/2022 15:07   CT Angio Chest PE W and/or Wo Contrast  Result Date: 02/17/2022 CLINICAL DATA:  Pulmonary embolism (PE) suspected, high prob Syncopal episode. EXAM: CT ANGIOGRAPHY CHEST WITH CONTRAST TECHNIQUE: Multidetector CT imaging of the chest was performed using the standard protocol during bolus administration of intravenous contrast. Multiplanar CT image reconstructions and MIPs were obtained to evaluate the vascular anatomy. RADIATION DOSE REDUCTION: This exam was performed according to the departmental dose-optimization program which includes automated exposure control, adjustment of the mA and/or kV according to patient size and/or use of iterative reconstruction technique. CONTRAST:  75mL OMNIPAQUE IOHEXOL 350 MG/ML SOLN COMPARISON:  None Available. FINDINGS: Cardiovascular: Suboptimal contrast bolus timing for pulmonary artery assessment. Aorta of the contrast is within the pulmonary venous system. Examination is diagnostic to the proximal segmental level, no evidence of pulmonary embolus. The thoracic aorta is normal in caliber. Common origin of the brachiocephalic and left common carotid artery, variant arch anatomy. Heart is normal in size. Occasional coronary artery calcifications. No pericardial effusion. Mediastinum/Nodes: No mediastinal or hilar adenopathy. No esophageal wall thickening. No thyroid nodule. Lungs/Pleura: Hypoventilatory changes in the dependent lungs. No confluent consolidation. There is a 5 mm perifissural nodule in the right middle lobe, series 7, image 67. No pleural fluid. The trachea and central bronchi are patent. Upper Abdomen:  Suspected hepatic steatosis. No acute upper abdominal findings. Musculoskeletal: Thoracic spondylosis with anterior spurring. There are no acute or suspicious osseous abnormalities. No chest wall soft tissue abnormalities. Review of the MIP images confirms the above findings. IMPRESSION: 1. Suboptimal contrast bolus timing for pulmonary artery assessment. No central pulmonary embolus to the proximal segmental level. 2. Hypoventilatory changes in the dependent lungs. 3. A 5 mm perifissural nodule in the right middle lobe. No routine follow-up imaging is recommended per Fleischner Society Guidelines. These guidelines do not apply to immunocompromised patients and patients with cancer. Follow up in patients  with significant comorbidities as clinically warranted. For lung cancer screening, adhere to Lung-RADS guidelines. Reference: Radiology. 2017; 284(1):228-43; Radiology. 2012; 265(2):611-6; Radiology. 2010; 254(3):949-56. Electronically Signed   By: Narda Rutherford M.D.   On: 02/17/2022 22:05   CT Cervical Spine Wo Contrast  Result Date: 02/17/2022 CLINICAL DATA:  Head trauma, minor (Age >= 65y); Neck trauma (Age >= 65y) EXAM: CT HEAD WITHOUT CONTRAST CT CERVICAL SPINE WITHOUT CONTRAST TECHNIQUE: Multidetector CT imaging of the head and cervical spine was performed following the standard protocol without intravenous contrast. Multiplanar CT image reconstructions of the cervical spine were also generated. RADIATION DOSE REDUCTION: This exam was performed according to the departmental dose-optimization program which includes automated exposure control, adjustment of the mA and/or kV according to patient size and/or use of iterative reconstruction technique. COMPARISON:  None Available. FINDINGS: CT HEAD FINDINGS Brain: No evidence of acute infarction, hemorrhage, hydrocephalus, extra-axial collection or mass lesion/mass effect. Patchy white matter hypodensities, nonspecific but compatible with chronic microvascular  ischemic disease. Partially empty sella. Vascular: No evidence of hyperdense vessel. Skull: No acute fracture. Sinuses/Orbits: Small retention cysts in the maxillary sinuses. Otherwise clear sinuses. Other: No mastoid effusions. CT CERVICAL SPINE FINDINGS Alignment: Straightening of the normal cervical lordosis. No substantial sagittal subluxation. Skull base and vertebrae: Vertebral body heights are maintained. No evidence of acute fracture. Soft tissues and spinal canal: Visualized lung apices are clear. Disc levels: Moderate multilevel degenerative change including disc height loss and endplate spurring. Multilevel facet and uncovertebral hypertrophy with varying degrees of neural foraminal stenosis. Upper chest: Visualized lung apices are clear IMPRESSION: 1. No evidence of acute intracranial abnormality. 2. No evidence of acute fracture or traumatic malalignment. Electronically Signed   By: Feliberto Harts M.D.   On: 02/17/2022 15:07    Procedures .Marland KitchenLaceration Repair  Date/Time: 02/17/2022 8:32 PM Performed by: Placido Sou, PA-C Authorized by: Placido Sou, PA-C   Consent:    Consent obtained:  Verbal   Consent given by:  Patient   Risks discussed:  Pain, poor cosmetic result, poor wound healing, need for additional repair, nerve damage and infection Universal protocol:    Procedure explained and questions answered to patient or proxy's satisfaction: yes     Relevant documents present and verified: yes     Test results available: yes     Imaging studies available: yes     Required blood products, implants, devices, and special equipment available: yes     Site/side marked: yes     Immediately prior to procedure, a time out was called: yes     Patient identity confirmed:  Verbally with patient Anesthesia:    Anesthesia method:  Local infiltration   Local anesthetic:  Lidocaine 2% WITH epi Laceration details:    Location: right eyebrow.   Length (cm):  4 Pre-procedure details:     Preparation:  Patient was prepped and draped in usual sterile fashion Exploration:    Limited defect created (wound extended): no     Hemostasis achieved with:  Direct pressure   Imaging obtained comment:  CT   Imaging outcome: foreign body not noted     Wound exploration: wound explored through full range of motion     Contaminated: yes   Treatment:    Area cleansed with:  Povidone-iodine and saline   Amount of cleaning:  Extensive Skin repair:    Repair method:  Sutures   Suture size:  5-0   Suture material:  Prolene   Suture technique:  Simple interrupted  Number of sutures:  10 Approximation:    Approximation:  Close Repair type:    Repair type:  Complex (Wound required debridement and extensive irrigation before closure.) Post-procedure details:    Dressing:  Antibiotic ointment   Procedure completion:  Tolerated well, no immediate complications   Medications Ordered in ED Medications  feeding supplement (ENSURE ENLIVE / ENSURE PLUS) liquid 237 mL (237 mLs Oral Given 02/17/22 2054)  sodium chloride 0.9 % bolus 1,000 mL (0 mLs Intravenous Stopped 02/17/22 2207)  lidocaine-EPINEPHrine (XYLOCAINE W/EPI) 2 %-1:200000 (PF) injection 10 mL (10 mLs Infiltration Given 02/17/22 1852)  iohexol (OMNIPAQUE) 350 MG/ML injection 75 mL (75 mLs Intravenous Contrast Given 02/17/22 2155)   ED Course/ Medical Decision Making/ A&P                           Medical Decision Making Amount and/or Complexity of Data Reviewed Labs: ordered. Radiology: ordered.  Risk OTC drugs. Prescription drug management.   Pt is a 66 y.o. male who presents to the emergency department due to a syncopal episode that occurred last night.  Labs: CBC with RBCs of 2.85, hemoglobin of 10.4, hematocrit of 31.2, MCV of 109.5, MCH of 36.5. CMP with a CO2 of 21, glucose of 217, creatinine 1.64, albumin of 3.4, AST of 59, GFR 46. Elevated D-dimer 1.29. Troponin of 16.  Imaging: CT scan of the head and cervical  spine without contrast shows no evidence of acute intracranial abnormality.  No evidence of acute fracture or traumatic malalignment.  CTA of the chest shows suboptimal contrast bolus timing for pulmonary artery assessment.  No central pulmonary embolus to the proximal level.  Hypoventilatory changes in the dependent lungs.  A 5 mm perifissural nodule in the right middle lobe.  No routine follow-up imaging is recommended per Fleischner Society Guidelines. These guidelines do not apply to immunocompromised patients and patients with cancer. Follow up in patients with significant comorbidities as clinically warranted. For lung cancer screening, adhere to Lung-RADS guidelines.  I, Placido Sou, PA-C, personally reviewed and evaluated these images and lab results as part of my medical decision-making.  On my exam patient is A&O x3.  Speaking clearly and coherently.  Moving all 4 extremities with ease.  No gross deficits.  Heart is regular rate and rhythm.  No murmurs, rubs, or gallops.  Lungs are clear to auscultation bilaterally.  Abdomen is soft and nontender.  No midline spine tenderness noted.  Patient with a 4 cm X shaped laceration involving the right eyebrow.  It had been nearly 24 hours since the fall occurred so was partially granulated as well.  I cleaned it extensively and gently debrided the wound exposing the surfaces.  Wound was then closed with Prolene sutures.  Please see procedure note above.  Patient tolerated the procedure quite well.  Recommended suture removal in 1 week.  Discussed wound care in length.  Patient states that he has poor dentition and because of this has difficulty with p.o. intake.  Since his dentition worsened about 1 year ago he has had episodes of lightheadedness and at times also experienced syncope.  States he has been seen at the [place] for this many times.  I obtained CT scans of the head and cervical spine as well as a CTA of the chest.  Findings as noted above.   Appears generally reassuring.  They did note a 5 mm nodule in the right middle lobe of the lungs.  Discussed  this with the patient at length.  Recommended PCP follow-up.  He verbalized understanding.  Hemoglobin and kidney function appears similar to the patient's baseline.  Patient denying any chest pain or shortness of breath but given his syncopal episode obtained a single troponin.  Appears reassuring within normal limits at 16.  On reassessment patient is lying comfortably in bed.  He is eager to be discharged.  Feel this is reasonable.  He was given an Ensure supplement while he was here.  Recommended that he continue to supplement with similar protein shakes until he can be seen by dental provider.  He verbalized understanding.  We discussed return precautions in length.  His questions were answered and he was amicable at the time of discharge  Note: Portions of this report may have been transcribed using voice recognition software. Every effort was made to ensure accuracy; however, inadvertent computerized transcription errors may be present.   Final Clinical Impression(s) / ED Diagnoses Final diagnoses:  Syncope and collapse  Pulmonary nodule   Rx / DC Orders ED Discharge Orders     None         Placido Sou, PA-C 02/17/22 2253    Glendora Score, MD 02/18/22 0030

## 2022-02-17 NOTE — ED Triage Notes (Signed)
Pt arrived POV from home c/o a syncopal episode that happened last night. Pt has a big laceration to his right eyebrow.

## 2022-02-17 NOTE — Discharge Instructions (Addendum)
Like we discussed, I would recommend that you begin drinking protein shakes.  You can do these 1-2 times per day.  This will help with your dietary deficiencies until you can be evaluated by a dentist.  Please continue to monitor your symptoms closely and return to the emergency department with any new or worsening symptoms.  Please follow-up with your regular doctor regarding your symptoms as well as this visit today.  We did note a pulmonary nodule on your CT scan.  This likely will not require any more imaging but please follow-up with your regular doctor regarding this as well.

## 2022-08-24 ENCOUNTER — Other Ambulatory Visit: Payer: Self-pay

## 2022-08-24 ENCOUNTER — Emergency Department (HOSPITAL_COMMUNITY): Payer: No Typology Code available for payment source

## 2022-08-24 ENCOUNTER — Observation Stay (HOSPITAL_BASED_OUTPATIENT_CLINIC_OR_DEPARTMENT_OTHER): Payer: No Typology Code available for payment source

## 2022-08-24 ENCOUNTER — Inpatient Hospital Stay (HOSPITAL_COMMUNITY)
Admission: EM | Admit: 2022-08-24 | Discharge: 2022-09-02 | DRG: 641 | Disposition: A | Discharge status: Skilled Nursing Facility | Payer: No Typology Code available for payment source | Attending: Family Medicine | Admitting: Family Medicine

## 2022-08-24 ENCOUNTER — Encounter (HOSPITAL_COMMUNITY): Payer: Self-pay

## 2022-08-24 DIAGNOSIS — K036 Deposits [accretions] on teeth: Secondary | ICD-10-CM

## 2022-08-24 DIAGNOSIS — E291 Testicular hypofunction: Secondary | ICD-10-CM | POA: Diagnosis present

## 2022-08-24 DIAGNOSIS — K08109 Complete loss of teeth, unspecified cause, unspecified class: Secondary | ICD-10-CM

## 2022-08-24 DIAGNOSIS — Z7982 Long term (current) use of aspirin: Secondary | ICD-10-CM

## 2022-08-24 DIAGNOSIS — R296 Repeated falls: Secondary | ICD-10-CM | POA: Diagnosis present

## 2022-08-24 DIAGNOSIS — I951 Orthostatic hypotension: Secondary | ICD-10-CM

## 2022-08-24 DIAGNOSIS — E860 Dehydration: Principal | ICD-10-CM | POA: Diagnosis present

## 2022-08-24 DIAGNOSIS — R55 Syncope and collapse: Secondary | ICD-10-CM | POA: Diagnosis present

## 2022-08-24 DIAGNOSIS — E785 Hyperlipidemia, unspecified: Secondary | ICD-10-CM | POA: Diagnosis present

## 2022-08-24 DIAGNOSIS — Z888 Allergy status to other drugs, medicaments and biological substances status: Secondary | ICD-10-CM

## 2022-08-24 DIAGNOSIS — K045 Chronic apical periodontitis: Secondary | ICD-10-CM

## 2022-08-24 DIAGNOSIS — Z8249 Family history of ischemic heart disease and other diseases of the circulatory system: Secondary | ICD-10-CM

## 2022-08-24 DIAGNOSIS — E119 Type 2 diabetes mellitus without complications: Secondary | ICD-10-CM

## 2022-08-24 DIAGNOSIS — R531 Weakness: Secondary | ICD-10-CM

## 2022-08-24 DIAGNOSIS — Z79899 Other long term (current) drug therapy: Secondary | ICD-10-CM

## 2022-08-24 DIAGNOSIS — R1311 Dysphagia, oral phase: Secondary | ICD-10-CM | POA: Diagnosis present

## 2022-08-24 DIAGNOSIS — R1310 Dysphagia, unspecified: Secondary | ICD-10-CM | POA: Diagnosis not present

## 2022-08-24 DIAGNOSIS — N400 Enlarged prostate without lower urinary tract symptoms: Secondary | ICD-10-CM | POA: Diagnosis present

## 2022-08-24 DIAGNOSIS — F1090 Alcohol use, unspecified, uncomplicated: Secondary | ICD-10-CM

## 2022-08-24 DIAGNOSIS — Z96653 Presence of artificial knee joint, bilateral: Secondary | ICD-10-CM | POA: Diagnosis present

## 2022-08-24 DIAGNOSIS — E440 Moderate protein-calorie malnutrition: Secondary | ICD-10-CM | POA: Insufficient documentation

## 2022-08-24 DIAGNOSIS — I10 Essential (primary) hypertension: Secondary | ICD-10-CM | POA: Diagnosis present

## 2022-08-24 DIAGNOSIS — K089 Disorder of teeth and supporting structures, unspecified: Secondary | ICD-10-CM

## 2022-08-24 DIAGNOSIS — Z7984 Long term (current) use of oral hypoglycemic drugs: Secondary | ICD-10-CM

## 2022-08-24 DIAGNOSIS — K029 Dental caries, unspecified: Secondary | ICD-10-CM

## 2022-08-24 DIAGNOSIS — Z833 Family history of diabetes mellitus: Secondary | ICD-10-CM

## 2022-08-24 DIAGNOSIS — F32A Depression, unspecified: Secondary | ICD-10-CM | POA: Diagnosis present

## 2022-08-24 DIAGNOSIS — F1010 Alcohol abuse, uncomplicated: Secondary | ICD-10-CM | POA: Diagnosis present

## 2022-08-24 DIAGNOSIS — M1990 Unspecified osteoarthritis, unspecified site: Secondary | ICD-10-CM | POA: Diagnosis present

## 2022-08-24 DIAGNOSIS — Z6823 Body mass index (BMI) 23.0-23.9, adult: Secondary | ICD-10-CM

## 2022-08-24 DIAGNOSIS — D539 Nutritional anemia, unspecified: Secondary | ICD-10-CM

## 2022-08-24 DIAGNOSIS — Z0120 Encounter for dental examination and cleaning without abnormal findings: Secondary | ICD-10-CM

## 2022-08-24 LAB — COMPREHENSIVE METABOLIC PANEL
ALT: 39 U/L (ref 0–44)
AST: 66 U/L — ABNORMAL HIGH (ref 15–41)
Albumin: 2.9 g/dL — ABNORMAL LOW (ref 3.5–5.0)
Alkaline Phosphatase: 35 U/L — ABNORMAL LOW (ref 38–126)
Anion gap: 10 (ref 5–15)
BUN: 10 mg/dL (ref 8–23)
CO2: 25 mmol/L (ref 22–32)
Calcium: 9.2 mg/dL (ref 8.9–10.3)
Chloride: 108 mmol/L (ref 98–111)
Creatinine, Ser: 1.74 mg/dL — ABNORMAL HIGH (ref 0.61–1.24)
GFR, Estimated: 43 mL/min — ABNORMAL LOW (ref >60)
Glucose, Bld: 123 mg/dL — ABNORMAL HIGH (ref 70–99)
Potassium: 4.1 mmol/L (ref 3.5–5.1)
Sodium: 143 mmol/L (ref 135–145)
Total Bilirubin: 0.4 mg/dL (ref 0.3–1.2)
Total Protein: 6.2 g/dL — ABNORMAL LOW (ref 6.5–8.1)

## 2022-08-24 LAB — HEMOGLOBIN A1C
Hgb A1c MFr Bld: 5.2 % (ref 4.8–5.6)
Mean Plasma Glucose: 103 mg/dL

## 2022-08-24 LAB — CBC WITH DIFFERENTIAL/PLATELET
Abs Immature Granulocytes: 0.04 10*3/uL (ref 0.00–0.07)
Basophils Absolute: 0 10*3/uL (ref 0.0–0.1)
Basophils Relative: 0 %
Eosinophils Absolute: 0 10*3/uL (ref 0.0–0.5)
Eosinophils Relative: 0 %
HCT: 31.2 % — ABNORMAL LOW (ref 39.0–52.0)
Hemoglobin: 11 g/dL — ABNORMAL LOW (ref 13.0–17.0)
Immature Granulocytes: 1 %
Lymphocytes Relative: 23 %
Lymphs Abs: 1.5 10*3/uL (ref 0.7–4.0)
MCH: 38.1 pg — ABNORMAL HIGH (ref 26.0–34.0)
MCHC: 35.3 g/dL (ref 30.0–36.0)
MCV: 108 fL — ABNORMAL HIGH (ref 80.0–100.0)
Monocytes Absolute: 0.6 10*3/uL (ref 0.1–1.0)
Monocytes Relative: 9 %
Neutro Abs: 4.6 10*3/uL (ref 1.7–7.7)
Neutrophils Relative %: 67 %
Platelets: 193 10*3/uL (ref 150–400)
RBC: 2.89 MIL/uL — ABNORMAL LOW (ref 4.22–5.81)
RDW: 13 % (ref 11.5–15.5)
WBC: 6.8 10*3/uL (ref 4.0–10.5)
nRBC: 0 % (ref 0.0–0.2)

## 2022-08-24 LAB — TROPONIN I (HIGH SENSITIVITY)
Troponin I (High Sensitivity): 10 ng/L (ref <18)
Troponin I (High Sensitivity): 12 ng/L (ref <18)

## 2022-08-24 LAB — ECHOCARDIOGRAM COMPLETE
Area-P 1/2: 3.48 cm2
Height: 72 in
S' Lateral: 2.8 cm
Weight: 2720 oz

## 2022-08-24 LAB — HIV ANTIBODY (ROUTINE TESTING W REFLEX): HIV Screen 4th Generation wRfx: NONREACTIVE

## 2022-08-24 MED ORDER — METFORMIN HCL ER 500 MG PO TB24
500.0000 mg | ORAL_TABLET | Freq: Every day | ORAL | Status: DC
  Administered 2022-08-25 – 2022-08-27 (×3): 500 mg via ORAL
  Filled 2022-08-24 (×4): qty 1

## 2022-08-24 MED ORDER — ENOXAPARIN SODIUM 40 MG/0.4ML IJ SOSY
40.0000 mg | PREFILLED_SYRINGE | INTRAMUSCULAR | Status: DC
  Administered 2022-08-24 – 2022-09-01 (×9): 40 mg via SUBCUTANEOUS
  Filled 2022-08-24 (×9): qty 0.4

## 2022-08-24 MED ORDER — ADULT MULTIVITAMIN W/MINERALS CH
1.0000 | ORAL_TABLET | Freq: Every day | ORAL | Status: DC
  Administered 2022-08-25 – 2022-09-02 (×9): 1 via ORAL
  Filled 2022-08-24 (×9): qty 1

## 2022-08-24 MED ORDER — THIAMINE MONONITRATE 100 MG PO TABS
100.0000 mg | ORAL_TABLET | Freq: Every day | ORAL | Status: DC
  Administered 2022-08-25 – 2022-09-02 (×9): 100 mg via ORAL
  Filled 2022-08-24 (×9): qty 1

## 2022-08-24 MED ORDER — ATORVASTATIN CALCIUM 10 MG PO TABS
10.0000 mg | ORAL_TABLET | Freq: Every day | ORAL | Status: DC
  Administered 2022-08-25 – 2022-09-02 (×9): 10 mg via ORAL
  Filled 2022-08-24 (×9): qty 1

## 2022-08-24 MED ORDER — ACETAMINOPHEN 325 MG PO TABS: 650.0000 mg | ORAL_TABLET | Freq: Four times a day (QID) | ORAL | Status: DC | PRN

## 2022-08-24 MED ORDER — FOLIC ACID 1 MG PO TABS
1.0000 mg | ORAL_TABLET | Freq: Every day | ORAL | Status: DC
  Administered 2022-08-25 – 2022-09-02 (×9): 1 mg via ORAL
  Filled 2022-08-24 (×9): qty 1

## 2022-08-24 MED ORDER — ASPIRIN 81 MG PO TBEC
81.0000 mg | DELAYED_RELEASE_TABLET | Freq: Every day | ORAL | Status: DC
  Administered 2022-08-25 – 2022-09-02 (×9): 81 mg via ORAL
  Filled 2022-08-24 (×9): qty 1

## 2022-08-24 MED ORDER — LACTATED RINGERS IV BOLUS
1000.0000 mL | Freq: Once | INTRAVENOUS | Status: AC
  Administered 2022-08-24: 1000 mL via INTRAVENOUS

## 2022-08-24 MED ORDER — THIAMINE HCL 100 MG/ML IJ SOLN
100.0000 mg | Freq: Every day | INTRAMUSCULAR | Status: DC
  Administered 2022-08-24: 100 mg via INTRAVENOUS
  Filled 2022-08-24 (×2): qty 2

## 2022-08-24 NOTE — Assessment & Plan Note (Deleted)
CIWAs stable. D/c protocol.

## 2022-08-24 NOTE — Assessment & Plan Note (Addendum)
Persistent orthostasis with difficulty eating due to pain. Per dentistry, no concern for dental infections or further inpatient workup at this time. -D/c MIVF with goal to increase oral intake -Nutrition consulted, continue dietary supplements -SLP consulted, pending reassessment to advance patient diet while inpatient -Trial Tylenol prior to meals -PT/OT recommending acute rehab -Tylenol 650mg  q6h prn -CBG q12h

## 2022-08-24 NOTE — Progress Notes (Signed)
FMTS Brief Progress Note  S:Rounded on patient with Dr. Velna Ochs. He endorses feeling well, wasable to eat dinner without issues. Denies dizziness, SOB, tremors. States his dizziness occurs when he stands up typically but  did say he needed to use the restroom. We observed him slowly stand up and walk to the restroom without issues. Discussed the plan for tomorrow. Questions and concerns addressed.     O: BP 131/88 (BP Location: Right Arm)   Pulse 98   Temp 98.2 F (36.8 C) (Oral)   Resp 18   Ht 6' (1.829 m)   Wt 77.1 kg   SpO2 99%   BMI 23.06 kg/m   General: alert, pleasant, NAD Resp: normal WOB on RA Ext: moving equally and spontaneously   A/P: Syncope Likely 2/2 orthostasis/poor po/feeding difficulties  - SLP to do FEES tomorrow 12/12 - monitor CIWA - Orders reviewed. Labs for AM ordered, which was adjusted as needed.   Remainder of plan per H&P  Cora Collum, DO 08/24/2022, 7:26 PM PGY-3, [facility] Night Resident  Please page [phone] with questions.

## 2022-08-24 NOTE — ED Notes (Signed)
Pt transported to vascular via stretcher.

## 2022-08-24 NOTE — ED Triage Notes (Addendum)
Pt BIB [facility] From home c/o multiple syncopal episodes since thanksgiving with the last episode being at 6am this morning. Pt is orthostatic positive per EMS. Pt also states he has lost 70 lbs in 30 days. Pt also endorses dizziness.

## 2022-08-24 NOTE — ED Provider Notes (Signed)
[place] EMERGENCY DEPARTMENT Provider Note  CSN: [id number] Arrival date & time: 08/24/22 1017  Chief Complaint(s) Loss of Consciousness  HPI John Fisher is a 66 y.o. male with PMH T2DM, HTN who presents emerged department for evaluation of syncope and weight loss.  I personally saw this patient 6 months ago in emergency department for similar complaints where he had fallen and struck his head on the ground secondary to an orthostatic syncopal event.  At that time, he was having decreased p.o. intake secondary to worsening dental pain and he has since followed up with his dentist and has had multiple dental extractions but states that he is still unable to eat and having multiple syncopal episodes.  He states that his syncope occurs when going from sitting to standing and his primary concern is that he has lost approximately 70 pounds over the last year and now feels that his swallowing muscles do not work well and he is unable to force food down esophagus.  Denies chest pain, shortness of breath, headache, fever or other systemic symptoms.   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 Authorizing Provider  amLODipine (NORVASC) 10 MG tablet Take 10 mg by mouth daily.   Yes [provider]  aspirin EC 81 MG tablet Take 81 mg by mouth daily.   Yes [provider]  atorvastatin (LIPITOR) 20 MG tablet Take 10 mg by mouth daily.   Yes [provider]  Cyanocobalamin 1000 MCG CAPS Take 1 tablet by mouth daily. 12/08/13  Yes [provider]  fluticasone (FLONASE) 50 MCG/ACT nasal spray Place 1 spray into both nostrils 2 (two) times daily as needed for allergies or rhinitis. 09/11/20  Yes Garnet Sierras, DO  metFORMIN (GLUCOPHAGE-XR) 500 MG 24 hr tablet Take 2,000 mg by mouth daily with breakfast.   Yes [provider]  naloxone (NARCAN) nasal spray 4 mg/0.1 mL Place 1 spray into the nose once. 01/12/22  Yes [provider]  betamethasone valerate (VALISONE) 0.1 % cream Apply 1 application topically daily. Small amount applied to face daily Patient not taking: Reported on 05/29/2020    [provider]  cetirizine (ZYRTEC) 10 MG tablet Take 1 tablet (10 mg total) by mouth daily. Patient not taking: Reported on 09/11/2020 05/29/20   Garnet Sierras, DO  EPINEPHrine 0.3 mg/0.3 mL IJ SOAJ injection Inject 0.3 mg into the muscle as needed. Patient not taking: Reported on 08/24/2022 09/11/20   Garnet Sierras, DO  montelukast (SINGULAIR) 10  MG tablet Take 1 tablet (10 mg total) by mouth at bedtime. Patient not taking: Reported on 08/24/2022 09/11/20   Ellamae Sia, DO  oxyCODONE-acetaminophen (PERCOCET) 10-325 MG tablet Take 1-2 tablets by mouth every 6 (six) hours as needed. For pain. Patient not taking: Reported on 05/29/2020 07/11/10   [provider]  pioglitazone (ACTOS) 15 MG tablet Take 15 mg by mouth daily. Patient not taking: Reported on 05/29/2020    [provider]  sildenafil (VIAGRA) 100 MG tablet Take 100 mg by mouth daily as needed for erectile dysfunction. Patient not taking: Reported on 08/24/2022    [provider]  tamsulosin (FLOMAX) 0.4 MG CAPS capsule Take 0.4 mg by mouth. Patient not taking: Reported on 08/24/2022    [provider]  traMADol (ULTRAM) 50 MG tablet Take by mouth every 6 (six) hours as needed. Patient not taking: Reported on 08/24/2022    [provider]  Vitamin D, Ergocalciferol, (DRISDOL) 50000 units CAPS capsule Take 50,000 Units by mouth every 7 (seven) days.  Patient not taking: Reported on 08/24/2022    [provider]                                                                                                                                    Past Surgical History Past Surgical History:  Procedure Laterality Date   left knee replacement  02/13/2016   REPLACEMENT TOTAL KNEE Right    Family History Family History  Problem Relation Age of Onset   Diabetes Mother    Hypertension Father    Diabetes Father    Diabetes Brother     Social History Social History   Tobacco Use   Smoking status: Never   Smokeless tobacco: Never  Vaping Use   Vaping Use: Never used  Substance Use Topics   Alcohol use: Yes    Alcohol/week: 14.0 standard drinks of alcohol    Types: 14 Standard drinks or equivalent per week   Drug use: No   Allergies Trospium and Alfuzosin  Review of Systems Review of Systems  Constitutional:  Positive for fatigue and unexpected weight change.  HENT:  Positive for trouble swallowing.   Neurological:  Positive for syncope.    Physical Exam Vital Signs  I have reviewed the triage vital signs BP (!) 108/93 (BP Location: Left Arm)   Pulse 71   Temp 98.1 F (36.7 C)   Resp 16   Ht 6' (1.829 m)   Wt 77.1 kg   SpO2 92%   BMI 23.06 kg/m   Physical Exam Vitals and nursing note reviewed.  Constitutional:      General: He is not in  acute distress.    Appearance: He is well-developed.  HENT:     Head: Normocephalic and atraumatic.  Eyes:     Conjunctiva/sclera: Conjunctivae normal.  Cardiovascular:     Rate and Rhythm: Normal rate and regular  rhythm.     Heart sounds: No murmur heard. Pulmonary:     Effort: Pulmonary effort is normal. No respiratory distress.     Breath sounds: Normal breath sounds.  Abdominal:     Palpations: Abdomen is soft.     Tenderness: There is no abdominal tenderness.  Musculoskeletal:        General: No swelling.     Cervical back: Neck supple.  Skin:    General: Skin is warm and dry.     Capillary Refill: Capillary refill takes less than 2 seconds.  Neurological:     Mental Status: He is alert.  Psychiatric:        Mood and Affect: Mood normal.     ED Results and Treatments Labs (all labs ordered are listed, but only abnormal results are displayed) Labs Reviewed  COMPREHENSIVE METABOLIC PANEL  CBC WITH DIFFERENTIAL/PLATELET  URINALYSIS, ROUTINE W REFLEX MICROSCOPIC  RAPID URINE DRUG SCREEN, HOSP PERFORMED  TROPONIN I (HIGH SENSITIVITY)                                                                                                                          Radiology No results found.  Pertinent labs & imaging results that were available during my care of the patient were reviewed by me and considered in my medical decision making (see MDM for details).  Medications Ordered in ED Medications  lactated ringers bolus 1,000 mL (has no administration in time range)                                                                                                                                     Procedures .Critical Care  Performed by: Glendora Score, MD Authorized by: Glendora Score, MD   Critical care provider statement:    Critical care time (minutes):  30   Critical care was necessary to treat or prevent imminent or life-threatening deterioration of the following  conditions:  Dehydration   Critical care was time spent personally by me on the following activities:  Development of treatment plan with patient or surrogate, discussions with consultants, evaluation of patient's response to treatment, examination of patient, ordering and review of laboratory studies, ordering and review of radiographic studies, ordering and performing treatments and interventions, pulse oximetry, re-evaluation of patient's condition and review of old charts   (including critical care time)  Medical Decision Making / ED  Course   This patient presents to the ED for concern of syncope, inability to tolerate p.o., this involves an extensive number of treatment options, and is a complaint that carries with it a high risk of complications and morbidity.  The differential diagnosis includes orthostatic syncope, cardiogenic syncope, malnutrition, oral aversion, dental pain complicating oral intake, swallowing muscle dysfunction  MDM: Patient seen the emergency room for evaluation of syncope and unable to tolerate p.o.  Physical exam largely unremarkable outside of dental caries seen.  Laboratory evaluation with a hemoglobin of 11.0 with an MCV of 108.0 consistent with his alcohol use.  Creatinine slightly elevated 1.74 with an AST of 66 and albumin 2.9 consistent with decreased p.o. intake.  High-sensitivity troponin normal.  Chest x-ray unremarkable.  CT head obtained as patient did have a head strike with a syncopal episode and this was reassuringly negative.  ECG nonischemic but does show some blocked PACs.  I consulted speech pathology who came and evaluated the patient at bedside who recommends FEES and additional interventions while inpatient.  Patient fluid resuscitated and admitted to the family medicine service.   Additional history obtained:  -External records from outside source obtained and reviewed including: Chart review including previous notes, labs, imaging, consultation  notes   Lab Tests: -I ordered, reviewed, and interpreted labs.   The pertinent results include:   Labs Reviewed  COMPREHENSIVE METABOLIC PANEL  CBC WITH DIFFERENTIAL/PLATELET  URINALYSIS, ROUTINE W REFLEX MICROSCOPIC  RAPID URINE DRUG SCREEN, HOSP PERFORMED  TROPONIN I (HIGH SENSITIVITY)      EKG   EKG Interpretation  Date/Time:  Monday August 24 2022 11:02:26 EST Ventricular Rate:  103 PR Interval:  136 QRS Duration: 66 QT Interval:  316 QTC Calculation: 413 R Axis:   0 Text Interpretation: Sinus tachycardia with Blocked Premature atrial complexes with Premature ventricular complexes or Fusion complexes Minimal voltage criteria for LVH, may be normal variant ( R in aVL ) Abnormal ECG When compared with ECG of 17-Feb-2022 13:42, PREVIOUS ECG IS PRESENT Confirmed by [place] ([id number]) on 08/24/2022 11:43:35 AM         Imaging Studies ordered: I ordered imaging studies including chest x-ray, CT head I independently visualized and interpreted imaging. I agree with the radiologist interpretation   Medicines ordered and prescription drug management: Meds ordered this encounter  Medications   lactated ringers bolus 1,000 mL    -I have reviewed the patients home medicines and have made adjustments as needed  Critical interventions IVF  Consultations Obtained: I requested consultation with the speech pathologist,  and discussed lab and imaging findings as well as pertinent plan - they recommend: FEES and inpatient workup   Cardiac Monitoring: The patient was maintained on a cardiac monitor.  I personally viewed and interpreted the cardiac monitored which showed an underlying rhythm of: NSR  Social Determinants of Health:  Factors impacting patients care include: none   Reevaluation: After the interventions noted above, I reevaluated the patient and found that they have :improved  Co morbidities that complicate the patient evaluation  Past Medical History:   Diagnosis Date   Diabetes mellitus    Erectile dysfunction    Hypertension    Hypogonadism male    Osteoarthritis       Dispostion: I considered admission for this patient, and due to inability to tolerate p.o. patient will require hospital admission     Final Clinical Impression(s) / ED Diagnoses Final diagnoses:  None     @PCDICTATION @  Teressa Lower, MD 08/24/22 [phone]

## 2022-08-24 NOTE — Progress Notes (Signed)
  Echocardiogram 2D Echocardiogram has been performed.  John Fisher 08/24/2022, 5:29 PM

## 2022-08-24 NOTE — H&P (Addendum)
Hospital Admission History and Physical Service Pager: [phone]  Patient name: John Fisher Medical record number: [id number] Date of Birth: October 03, 1955 Age: 66 y.o. Gender: male  Primary Care Provider: [facility] Consultants: None Code Status: Full code Preferred Emergency Contact: John Fisher (daughter) Contact Information     Name Relation Home Work Mobile   Fisher,John Spouse [phone]     Fisher,John Daughter   [phone]       Chief Complaint: Syncope  Assessment and Plan: John Fisher is a 66 y.o. male presenting with multiple episodes of syncope. Most likely multifactorial in nature due poor PO intake due to dental pain and depression, macrocytic anemia and dehydration in the setting of alcohol use disorder and poor PO intake. Less likely on the differential is cardiogenic causes including arrhythmia (NSR on telemetry and EKG), valvular causes (not exertional), ACS (normal trop), infectious causes (afebrile, no obvious) and neurologic causes (normal neuro exam, negative CT head). PMHx includes HTN, T2DM, MDD, alcohol use disorder and THC use.  * Syncope Most likely multifactorial in nature but stemming from prolonged poor PO intake due to dental pain and swallowing difficulties. Significant orthostasis with dehydration in the setting chronic alcohol use disorder and low fluid intake. Macrocytic anemia suggestive of vitamin B12 deficiency and malnutrition. S/p 1L LR bolus in ED. CT head negative. Vitals stable and neurologically intact. -Admitted to FMTS service, Dr. Pollie Meyer attending, med tele unit -Consult nutrition for dietary recommendations in the setting of malnutrition, appreciate recommendations -SLP consulted, recommending FEES tomorrow AM -Echo -Cardiac telemetry to monitor for arrythmia -Fall precautions -Vitals per unit -Follow-up UA, UDS -Trend CBC given macrocytic anemia -PT/OT eval and treat -Tylenol 650mg  q6h prn dental  pain -Hold home amlodipine -Consider outpatient colonoscopy given anemia, weight loss and lack of recent screening in the chart  Diabetes mellitus without complication (HCC) Patient reports recent A1c around 5 (also in notes). Endorses some episodes of hypoglycemia at home. -Ordered Metformin 500mg  daily as reported -Follow-up A1c -Monitor for hypoglycemia symptoms  Alcohol use disorder Currently drinks 1 pint whiskey/day for the past year. Denies h/o DT or withdrawal seizures. -CIWA protocol -Thiamine, multivitamin, folate daily -TOC for alcohol use disorder and THC use  Chronic conditions: HLD: continue Lipitor 40mg   FEN/GI: Dysphagia 1 diet (soft), thin liquids VTE Prophylaxis: Lovenox  Disposition: Med telemetry  History of Present Illness:  John Fisher is a 66 y.o. male presenting with multiple episodes of syncope over the last 2 weeks. Pt notes he fell this morning around 6AM when he "got up too fast" when going to the bathroom. He felt dizzy and lightheaded and fell but caught himself. Does not recall losing consciousness or hitting his head. Overall denies HA, difficulty breathing, palpitations and chest pain prior to falling down. He is falling frequently, ~4x in the last 10 days. Denies fever, dysuria and SOB at rest.  Of note, patient with dental pain for the past year that has significantly decreased his PO intake. Recently had 5 teeth extracted, 1 more to be removed. Endorses eating soft foods, soup and orange juice. States he has lost a significant amount of weight because of eating difficulties. States he has had low blood sugars at home. Further endorses difficulty swallowing with solids and liquids which started 3-4 weeks ago. It does not hurt to swallow, but it feels like the food will get stuck in his throat. He does feel this happens with saliva as well. Denies difficulty taking his  medications.   Further endorses history of depression but states he "is not  depressed". He associates it to his difficulty with not being able to eat because of his teeth. States he has also been drinking more heavily in the last year, around 1 pint per day. Not interested in quitting and states he will stop when "things get fixed".  In the ED, patient received CT head and CXR, negative for acute process. SLP consulted for swallow difficulties, recommended dysphagia 1 diet and FEES in the morning. Rehydrated with 1L LR bolus. Patient could not stand due to orthostatics.  Review Of Systems: Per HPI with the following additions: teeth pain  Pertinent Past Medical History: HTN T2DM - last A1c 5.0 per patient MDD Osteoarthritis Alcohol use disorder Cannabis use disorder Remainder reviewed in history tab.   Pertinent Past Surgical History: Left & right total knee replacement  Remainder reviewed in history tab.   Pertinent Social History: Tobacco use: Never Alcohol use: Yes, at least 1 pint per day (whiskey) for at least the last couple years and has been drinking since adolescence Other Substance use: THC - daily to help him sleep; cocaine years ago Lives alone  Pertinent Family History: Mother: diabetes Father: HTN, diabetes Brother: diabetes Remainder reviewed in history tab.   Important Outpatient Medications: Metformin 500mg  daily Amlodipine 10mg  daily Lipitor 10mg  daily ASA 81mg  daily Remainder reviewed in medication history.   Objective: BP 130/89   Pulse 73   Temp 98.1 F (36.7 C)   Resp (!) 22   Ht 6' (1.829 m)   Wt 77.1 kg   SpO2 100%   BMI 23.06 kg/m  Exam: General: Sitting upright in bed. NAD Eyes: PERRLA. EOMI intact. White sclera ENTM: Poor dentition missing multiple teeth. Neck: Supple, full ROM Cardiovascular: RRR without murmur Respiratory: CTAB. Normal WOB on RA. No wheezing or crackles. Gastrointestinal: Soft, non-tender, non-distended. +BS MSK: No peripheral edema Derm: Warm, dry. No lesions noted on feet  bilaterally. Neuro: CN II-XII intact. Motor and sensation intact globally Psych: Cooperative, pleasant  Labs:  CBC BMET  Recent Labs  Lab 08/24/22 1158  WBC 6.8  HGB 11.0*  HCT 31.2*  PLT 193   Recent Labs  Lab 08/24/22 1158  NA 143  K 4.1  CL 108  CO2 25  BUN 10  CREATININE 1.74*  GLUCOSE 123*  CALCIUM 9.2     Troponin: 10 (nl)  EKG: Tachycardia. Regular rhythm. Occasional PVCs  Imaging Studies Performed: DG Chest Portable 1 View  Result Date: 08/24/2022 IMPRESSION: No active disease.  CT Head Wo Contrast  Result Date: 08/24/2022 IMPRESSION: No evidence of intracranial injury.  14/11/23, MD 08/24/2022, 3:27 PM PGY-1, [facility] Family Medicine  FPTS Intern pager: [phone], text pages welcome Secure chat group [facility] Teaching Service   I was personally present and performed or re-performed the history, physical exam and medical decision making activities of this service and have verified that the service and findings are accurately documented in the intern's note.  14/07/2022, DO                  08/24/2022, 4:19 PM

## 2022-08-24 NOTE — Assessment & Plan Note (Deleted)
Patient reports recent A1c around 5 (also in [place] notes). Endorses some episodes of hypoglycemia at home. -Ordered Metformin 500mg  daily as reported -Follow-up A1c -Monitor for hypoglycemia symptoms

## 2022-08-24 NOTE — Evaluation (Signed)
Clinical/Bedside Swallow Evaluation Patient Details  Name: John Fisher MRN: [id number] Date of Birth: 06-13-1956  Today's Date: 08/24/2022 Time: SLP Start Time (ACUTE ONLY): 1400 SLP Stop Time (ACUTE ONLY): 1425 SLP Time Calculation (min) (ACUTE ONLY): 25 min  Past Medical History:  Past Medical History:  Diagnosis Date   Diabetes mellitus    Erectile dysfunction    Hypertension    Hypogonadism male    Osteoarthritis    Past Surgical History:  Past Surgical History:  Procedure Laterality Date   left knee replacement  02/13/2016   REPLACEMENT TOTAL KNEE Right    HPI:  John Fisher is a 66 y.o. male with PMH T2DM, HTN who presents emerged department for evaluation of syncope and weight loss.  I personally saw this patient 6 months ago in emergency department for similar complaints where he had fallen and struck his head on the ground secondary to an orthostatic syncopal event.  At that time, he was having decreased p.o. intake secondary to worsening dental pain and he has since followed up with his dentist and has had multiple dental extractions but states that he is still unable to eat and having multiple syncopal episodes.  He states that his syncope occurs when going from sitting to standing and his primary concern is that he has lost approximately 70 pounds over the last year and now feels that his swallowing muscles do not work well and he is unable to force food down esophagus.    Assessment / Plan / Recommendation  Clinical Impression  Pt demonstrates severe oral dysphagia with significant weight loss due to poor dentition, decreased ability to orally prepare solids with effortful and laborious oral transit and additional reports of pharyngeal pain with swallowing. Pt reports that even when he is eating a pureed or liquid food texture he is unable to eat more than 30% of a meal due to diffuse oral and throat pain. WHen observed with Ginger ale and puree pt demosntrates grimacing  and does not want more that 3 bites/sips of each. Pt will need instrumental testing to determine etiology of pain and effort that is impacting nutrition, muscle mass and safety with mobility. Will proceed with FEES instrumental assessment tomorrow am for direct visualization of pharynx tomorrow during swallow. May consider MBS later in the day if warranted depending on result of FEES. Pt would also benefit from dietician consult while admitted given  need for education regarding nutrition. SLP Visit Diagnosis: Dysphagia, oral phase (R13.11);Dysphagia, oropharyngeal phase (R13.12)    Aspiration Risk  Risk for inadequate nutrition/hydration    Diet Recommendation Dysphagia 1 (Puree);Thin liquid   Liquid Administration via: Cup;Straw Medication Administration: Whole meds with liquid Supervision: Patient able to self feed Compensations: Slow rate;Small sips/bites Postural Changes: Seated upright at 90 degrees    Other  Recommendations Oral Care Recommendations: Oral care BID Other Recommendations: Have oral suction available    Recommendations for follow up therapy are one component of a multi-disciplinary discharge planning process, led by the attending physician.  Recommendations may be updated based on patient status, additional functional criteria and insurance authorization.  Follow up Recommendations Outpatient SLP      Assistance Recommended at Discharge    Functional Status Assessment Patient has had a recent decline in their functional status and demonstrates the ability to make significant improvements in function in a reasonable and predictable amount of time.  Frequency and Duration            Prognosis  Swallow Study   General HPI: John Fisher is a 66 y.o. male with PMH T2DM, HTN who presents emerged department for evaluation of syncope and weight loss.  I personally saw this patient 6 months ago in emergency department for similar complaints where he had fallen  and struck his head on the ground secondary to an orthostatic syncopal event.  At that time, he was having decreased p.o. intake secondary to worsening dental pain and he has since followed up with his dentist and has had multiple dental extractions but states that he is still unable to eat and having multiple syncopal episodes.  He states that his syncope occurs when going from sitting to standing and his primary concern is that he has lost approximately 70 pounds over the last year and now feels that his swallowing muscles do not work well and he is unable to force food down esophagus. Type of Study: Bedside Swallow Evaluation Previous Swallow Assessment: see impression Diet Prior to this Study: Thin liquids Temperature Spikes Noted: No Respiratory Status: Room air History of Recent Intubation: No Behavior/Cognition: Alert;Cooperative;Pleasant mood Oral Cavity Assessment: Within Functional Limits Oral Care Completed by SLP: No Oral Cavity - Dentition: Poor condition;Missing dentition Vision: Functional for self-feeding Self-Feeding Abilities: Able to feed self Patient Positioning: Upright in bed Baseline Vocal Quality: Normal Volitional Cough: Strong Volitional Swallow: Able to elicit    Oral/Motor/Sensory Function Overall Oral Motor/Sensory Function: Within functional limits   Ice Chips     Thin Liquid Thin Liquid: Impaired Presentation: Straw Oral Phase Functional Implications: Prolonged oral transit Pharyngeal  Phase Impairments: Other (comments) (grimace, pain)    Nectar Thick Nectar Thick Liquid: Not tested   Honey Thick Honey Thick Liquid: Not tested   Puree Puree: Impaired Presentation: Spoon Pharyngeal Phase Impairments: Multiple swallows;Other (comments) (grimace pain, effort)   Solid     Solid: Not tested      [name], [name] 08/24/2022,2:01 PM

## 2022-08-25 DIAGNOSIS — K089 Disorder of teeth and supporting structures, unspecified: Secondary | ICD-10-CM | POA: Diagnosis not present

## 2022-08-25 DIAGNOSIS — R1311 Dysphagia, oral phase: Secondary | ICD-10-CM

## 2022-08-25 DIAGNOSIS — E440 Moderate protein-calorie malnutrition: Secondary | ICD-10-CM | POA: Insufficient documentation

## 2022-08-25 DIAGNOSIS — R55 Syncope and collapse: Secondary | ICD-10-CM | POA: Diagnosis not present

## 2022-08-25 LAB — BASIC METABOLIC PANEL
Anion gap: 7 (ref 5–15)
BUN: 11 mg/dL (ref 8–23)
CO2: 26 mmol/L (ref 22–32)
Calcium: 8.5 mg/dL — ABNORMAL LOW (ref 8.9–10.3)
Chloride: 109 mmol/L (ref 98–111)
Creatinine, Ser: 1.65 mg/dL — ABNORMAL HIGH (ref 0.61–1.24)
GFR, Estimated: 46 mL/min — ABNORMAL LOW (ref >60)
Glucose, Bld: 98 mg/dL (ref 70–99)
Potassium: 4.1 mmol/L (ref 3.5–5.1)
Sodium: 142 mmol/L (ref 135–145)

## 2022-08-25 LAB — CBC
HCT: 26.8 % — ABNORMAL LOW (ref 39.0–52.0)
Hemoglobin: 9.5 g/dL — ABNORMAL LOW (ref 13.0–17.0)
MCH: 38 pg — ABNORMAL HIGH (ref 26.0–34.0)
MCHC: 35.4 g/dL (ref 30.0–36.0)
MCV: 107.2 fL — ABNORMAL HIGH (ref 80.0–100.0)
Platelets: 156 10*3/uL (ref 150–400)
RBC: 2.5 MIL/uL — ABNORMAL LOW (ref 4.22–5.81)
RDW: 12.8 % (ref 11.5–15.5)
WBC: 5.8 10*3/uL (ref 4.0–10.5)
nRBC: 0 % (ref 0.0–0.2)

## 2022-08-25 LAB — URINALYSIS, ROUTINE W REFLEX MICROSCOPIC
Bilirubin Urine: NEGATIVE
Glucose, UA: NEGATIVE mg/dL
Hgb urine dipstick: NEGATIVE
Ketones, ur: NEGATIVE mg/dL
Leukocytes,Ua: NEGATIVE
Nitrite: NEGATIVE
Protein, ur: NEGATIVE mg/dL
Specific Gravity, Urine: 1.01 (ref 1.005–1.030)
pH: 7 (ref 5.0–8.0)

## 2022-08-25 LAB — RAPID URINE DRUG SCREEN, HOSP PERFORMED
Amphetamines: NOT DETECTED
Barbiturates: NOT DETECTED
Benzodiazepines: NOT DETECTED
Cocaine: NOT DETECTED
Opiates: NOT DETECTED
Tetrahydrocannabinol: POSITIVE — AB

## 2022-08-25 MED ORDER — ACETAMINOPHEN 325 MG PO TABS
650.0000 mg | ORAL_TABLET | Freq: Three times a day (TID) | ORAL | Status: DC
  Filled 2022-08-25: qty 2

## 2022-08-25 MED ORDER — ENSURE ENLIVE PO LIQD
237.0000 mL | Freq: Three times a day (TID) | ORAL | Status: DC
  Administered 2022-08-25 – 2022-09-01 (×18): 237 mL via ORAL

## 2022-08-25 NOTE — Progress Notes (Signed)
Initial Nutrition Assessment  DOCUMENTATION CODES:   Non-severe (moderate) malnutrition in context of chronic illness  INTERVENTION:  Continue diet as recommended by ST Ensure Enlive po TID, each supplement provides 350 kcal and 20 grams of protein. (Prefers chocolate flavor) MVI with minerals daily   NUTRITION DIAGNOSIS:   Moderate Malnutrition related to chronic illness (EtOH use, dysphagia) as evidenced by mild fat depletion, moderate muscle depletion.  GOAL:   Patient will meet greater than or equal to 90% of their needs  MONITOR:   PO intake, Supplement acceptance, Diet advancement, Weight trends, Labs  REASON FOR ASSESSMENT:   Consult Assessment of nutrition requirement/status  ASSESSMENT:   Pt admitted with multiple episodes of syncope in setting of poor PO intake. PMH significant for HTN, T2DM, MDD, alcohol use disorder, and THC use.  ST performed FEES today to evaluate pt's report of dysphagia. Pt demonstrates a severe oral dysphagia secondary to poor condition of his dentition. Pt at increased risk of aspiration. Recommend continuing with dysphagia 1 diet. Pt would benefit from dental extraction to eliminate pain, improve safety and timing of swallow and meet nutritional needs.   Pt reports that he started noticing changes in his PO intake and weight over the summer. He was playing [employer] with friends and noticed his weight had decreased from 202 lbs down to 197 lbs which was his goal weight. However he noticed his weight continued to trend down. That is when he began to notice he was having trouble eating.   He recalls ordering a 3 piece chicken meal and would eat half a piece of chicken and then would save the rest for later but noticed he wasn't finishing it. He states that he would eat vegetable soup if/when he was able to get it. Mouth pain and poor dentition has been a hindrance in his PO intake.   Pt reports that he stopped drinking sodas because it burns  his mouth. He stopped drinking water as well as he hasn't been eating and this is usually what reminds him to hydrate. He endorses drinking alcohol nightly.   Meal completions: 12/12: 50% breakfast  He is uncertain total amount of weight loss that has occurred since summer time but suspects it to be about 40 lbs. Reviewed weight history. Pt's weight was documented to be 81.2 kg on 06/06. Upon admission his weight noted to be 77.1 kg. This is a 5% weight loss which is not clinically significant for time frame.   Pt drinks Ensure on occasion but states that it causes constipation. Encouraged pt to continue these. Will monitor BM's throughout admission and can consider adding a bowel regimen if needed. Pt is agreeable.   Medications: folvite, metformin, MVI, thiamine  Labs: Cr 1.65, GFR 46  NUTRITION - FOCUSED PHYSICAL EXAM:  Flowsheet Row Most Recent Value  Orbital Region Mild depletion  Upper Arm Region Mild depletion  Thoracic and Lumbar Region No depletion  Buccal Region Mild depletion  Temple Region Moderate depletion  Clavicle Bone Region No depletion  Clavicle and Acromion Bone Region No depletion  Scapular Bone Region No depletion  Dorsal Hand Mild depletion  Patellar Region Moderate depletion  Anterior Thigh Region Moderate depletion  Posterior Calf Region Moderate depletion  Edema (RD Assessment) None  Hair Reviewed  Eyes Reviewed  Mouth Other (Comment)  [poor dentition]  Skin Reviewed  Nails Reviewed       Diet Order:   Diet Order  DIET - DYS 1 Room service appropriate? Yes; Fluid consistency: Thin  Diet effective now                   EDUCATION NEEDS:   Education needs have been addressed  Skin:  Skin Assessment: Reviewed RN Assessment  Last BM:  12/11 (type 6)  Height:   Ht Readings from Last 1 Encounters:  08/24/22 6' (1.829 m)    Weight:   Wt Readings from Last 1 Encounters:  08/24/22 77.1 kg   BMI:  Body mass index is 23.06  kg/m.  Estimated Nutritional Needs:   Kcal:  2000-2200  Protein:  100-115g  Fluid:  >/=2L  Drusilla Kanner, RDN, LDN Clinical Nutrition

## 2022-08-25 NOTE — Progress Notes (Signed)
Physical Therapy Evaluation Patient Details Name: John Fisher MRN: [id number] DOB: 1955-10-10 Today's Date: 08/25/2022  History of Present Illness  Pt is a 66 yr old male who presented due to multiple syncope episodes and has had decrease in PO intake due to dental work. PMH: HTN, T2DM, MDD, alcohol use disorder and THC use.  Clinical Impression  Pt was seen for evaluation and due to recent history of being alone, of many falls and his risk to further decline, will recommend SNF referral.  He is fairly debilitated but already recovering and joking about eating his food when tray arrives during session.  Follow acutely for strengthening and standing endurance, controlling his balance issues and encouraging better quality of gait for return home faster upon dc to rehab.  Hopefully he will be able to get past need for SNF care with consistent rehab so will leave his frerquency a bit higher in expectation of this.       Recommendations for follow up therapy are one component of a multi-disciplinary discharge planning process, led by the attending physician.  Recommendations may be updated based on patient status, additional functional criteria and insurance authorization.  Follow Up Recommendations Skilled nursing-short term rehab (<3 hours/day) Can patient physically be transported by private vehicle: No    Assistance Recommended at Discharge Frequent or constant Supervision/Assistance  Patient can return home with the following  A little help with walking and/or transfers;A little help with bathing/dressing/bathroom;Assistance with cooking/housework;Direct supervision/assist for medications management;Direct supervision/assist for financial management;Assist for transportation;Help with stairs or ramp for entrance    Equipment Recommendations Rolling walker (2 wheels)  Recommendations for Other Services       Functional Status Assessment Patient has had a recent decline in their functional  status and demonstrates the ability to make significant improvements in function in a reasonable and predictable amount of time.     Precautions / Restrictions Precautions Precautions: Fall Precaution Comments: frequent falls at home Restrictions Weight Bearing Restrictions: No      Mobility  Bed Mobility Overal bed mobility: Needs Assistance Bed Mobility: Supine to Sit, Sit to Supine     Supine to sit: Min assist Sit to supine: Min assist   General bed mobility comments: minor help needed, safety to move    Transfers Overall transfer level: Needs assistance Equipment used: Rolling walker (2 wheels) Transfers: Sit to/from Stand Sit to Stand: Min guard, Min assist           General transfer comment: stood with minor support on walker    Ambulation/Gait Ambulation/Gait assistance: Land (Feet): 25 Feet Assistive device: Rolling walker (2 wheels) Gait Pattern/deviations: Step-through pattern, Decreased stride length Gait velocity: reduced Gait velocity interpretation: <1.31 ft/sec, indicative of household ambulator Pre-gait activities: standing balance and O2 sats General Gait Details: gait is a short trip due to fatigue and weakness, pt is requiring close guard for safety  Stairs            Wheelchair Mobility    Modified Rankin (Stroke Patients Only)       Balance Overall balance assessment: Needs assistance Sitting-balance support: Feet supported Sitting balance-Leahy Scale: Good     Standing balance support: Bilateral upper extremity supported, During functional activity, No upper extremity supported Standing balance-Leahy Scale: Fair                               Pertinent Vitals/Pain Pain Assessment Pain Assessment:  Faces Faces Pain Scale: Hurts a little bit Pain Location: knees Pain Descriptors / Indicators: Aching Pain Intervention(s): Limited activity within patient's tolerance, Monitored during session,  Premedicated before session, Repositioned    Home Living Family/patient expects to be discharged to:: Private residence Living Arrangements: Alone   Type of Home: House Home Access: Other (comment) (inclined)       Home Layout: One level Home Equipment: Cane - single point      Prior Function Prior Level of Function : Independent/Modified Independent;Driving             Mobility Comments: feeling quite weak and unsafe, not able to rely on walking without help       Hand Dominance   Dominant Hand: Right    Extremity/Trunk Assessment   Upper Extremity Assessment Upper Extremity Assessment: Generalized weakness    Lower Extremity Assessment Lower Extremity Assessment: Generalized weakness    Cervical / Trunk Assessment Cervical / Trunk Assessment: Kyphotic  Communication   Communication: No difficulties  Cognition Arousal/Alertness: Awake/alert Behavior During Therapy: WFL for tasks assessed/performed Overall Cognitive Status: Within Functional Limits for tasks assessed                                          General Comments General comments (skin integrity, edema, etc.): pt is up on RW vs previous cane use due to fatigue and limited comfort to move with knee stiffness    Exercises     Assessment/Plan    PT Assessment Patient needs continued PT services  PT Problem List Decreased strength;Decreased range of motion;Decreased activity tolerance;Decreased balance;Decreased mobility;Decreased coordination;Decreased knowledge of use of DME;Decreased safety awareness       PT Treatment Interventions DME instruction;Gait training;Functional mobility training;Therapeutic exercise;Therapeutic activities;Balance training;Neuromuscular re-education;Patient/family education    PT Goals (Current goals can be found in the Care Plan section)  Acute Rehab PT Goals Patient Stated Goal: to walk safely and go home PT Goal Formulation: With  patient Time For Goal Achievement: 09/01/22 Potential to Achieve Goals: Good    Frequency Min 3X/week     Co-evaluation               AM-PAC PT "6 Clicks" Mobility  Outcome Measure Help needed turning from your back to your side while in a flat bed without using bedrails?: A Little Help needed moving from lying on your back to sitting on the side of a flat bed without using bedrails?: A Little Help needed moving to and from a bed to a chair (including a wheelchair)?: A Little Help needed standing up from a chair using your arms (e.g., wheelchair or bedside chair)?: A Little Help needed to walk in hospital room?: A Little Help needed climbing 3-5 steps with a railing? : A Lot 6 Click Score: 17    End of Session Equipment Utilized During Treatment: Gait belt Activity Tolerance: Patient limited by fatigue Patient left: in bed;with call bell/phone within reach;with bed alarm set Nurse Communication: Mobility status;Other (comment) (tired with effort but good quality movement) PT Visit Diagnosis: Unsteadiness on feet (R26.81);Muscle weakness (generalized) (M62.81);Repeated falls (R29.6)    Time: 1027-2536 PT Time Calculation (min) (ACUTE ONLY): 17 min   Charges:   PT Evaluation $PT Eval Moderate Complexity: 1 Mod         Ivar Drape 08/25/2022, 4:41 PM  Samul Dada, PT PhD Acute Rehab Dept. Number: [facility]  [id number] and [place]

## 2022-08-25 NOTE — Procedures (Signed)
Objective Swallowing Evaluation: Type of Study: FEES-Fiberoptic Endoscopic Evaluation of Swallow   Patient Details  Name: John Fisher MRN: [id number] Date of Birth: 1956/01/13  Today's Date: 08/25/2022 Time: SLP Start Time (ACUTE ONLY): 0920 -SLP Stop Time (ACUTE ONLY): 1000  SLP Time Calculation (min) (ACUTE ONLY): 40 min   Past Medical History:  Past Medical History:  Diagnosis Date   Diabetes mellitus    Erectile dysfunction    Hypertension    Hypogonadism male    Osteoarthritis    Past Surgical History:  Past Surgical History:  Procedure Laterality Date   left knee replacement  02/13/2016   REPLACEMENT TOTAL KNEE Right    HPI: John Fisher is a 66 y.o. male with PMH T2DM, HTN who presents emerged department for evaluation of syncope and weight loss.  I personally saw this patient 6 months ago in emergency department for similar complaints where he had fallen and struck his head on the ground secondary to an orthostatic syncopal event.  At that time, he was having decreased p.o. intake secondary to worsening dental pain and he has since followed up with his dentist and has had multiple dental extractions but states that he is still unable to eat and having multiple syncopal episodes.  He states that his syncope occurs when going from sitting to standing and his primary concern is that he has lost approximately 70 pounds over the last year and now feels that his swallowing muscles do not work well and he is unable to force food down esophagus.   No data recorded   Recommendations for follow up therapy are one component of a multi-disciplinary discharge planning process, led by the attending physician.  Recommendations may be updated based on patient status, additional functional criteria and insurance authorization.  Assessment / Plan / Recommendation     08/25/2022    9:00 AM  Clinical Impressions  Clinical Impression Pt demonstrates a severe oral dysphagia secondary to  poor condition of his dentition. Pt has pain and hypersnsitivity limiting endurance with meals and impacting intake to 30% of his typical quantity. During FEES pt observed to have effortful oral transit and moderate pain with swallow initiation without movement of a bolus through the pharynx. Oral transit is slow and piecemeal with uncontrolled spillage of unformed boluses to deep pharynx to trigger second and third swallows for one small bite or sip. Pt is at increased aspiration risk given performance. Pt is recommended to continue a pureed/very soft moist texture with thin liquids for a liquid wash, though regardless of texture modification, oral alimentation is expected to be unsufficient given pain and effort with all textures. Pt would benefit from dental extraction to eliminate pain, improve safety and timing of swallow and meet nutritional needs. Would not advise f/u with SLP until dentition has been treated.         08/25/2022    9:00 AM  Treatment Recommendations  Treatment Recommendations Other (Comment)         No data to display             08/25/2022    9:00 AM  Diet Recommendations  SLP Diet Recommendations Dysphagia 1 (Puree) solids;Thin liquid  Liquid Administration via Cup;Straw  Medication Administration Whole meds with liquid  Compensations Slow rate;Small sips/bites;Follow solids with liquid  Postural Changes Remain semi-upright after after feeds/meals (Comment);Seated upright at 90 degrees         08/25/2022    9:00 AM  Other Recommendations  Oral  Care Recommendations Oral care BID  Other Recommendations Have oral suction available        No data to display               08/25/2022    9:00 AM  Oral Phase  Oral Phase Impaired  Oral - Thin Teaspoon NT  Oral - Thin Cup Decreased bolus cohesion;Delayed oral transit;Holding of bolus;Lingual/palatal residue  Oral - Thin Straw Decreased bolus cohesion;Delayed oral transit;Holding of  bolus;Lingual/palatal residue  Oral - Puree Decreased bolus cohesion;Premature spillage;Holding of bolus;Lingual/palatal residue;Piecemeal swallowing  Oral - Regular Premature spillage;Decreased bolus cohesion;Delayed oral transit;Piecemeal swallowing;Lingual/palatal residue;Holding of bolus;Reduced posterior propulsion;Impaired mastication       08/25/2022    9:00 AM  Pharyngeal Phase  Pharyngeal Phase Impaired  Pharyngeal- Thin Cup Delayed swallow initiation-pyriform sinuses  Pharyngeal- Thin Straw Delayed swallow initiation-pyriform sinuses  Pharyngeal- Puree Delayed swallow initiation-pyriform sinuses  Pharyngeal- Regular Delayed swallow initiation-pyriform sinuses         No data to display           Tiandre Teall, [name] 08/25/2022, 11:37 AM

## 2022-08-25 NOTE — Progress Notes (Signed)
Urine collected and sent to lab at 0300

## 2022-08-25 NOTE — Evaluation (Signed)
Occupational Therapy Evaluation Patient Details Name: John Fisher MRN: [id number] DOB: 03-11-1956 Today's Date: 08/25/2022   History of Present Illness Pt is a 66 yr old male who presented due to multiple syncope episodes and has had decrease in PO intake due to dental work. PMH: HTN, T2DM, MDD, alcohol use disorder and THC use.   Clinical Impression   Pt at PLOF lives alone but reported has been declining in the ability to complete daily tasks due to frequency of falls. Pt reports in the last month they have had at least one fall a week. Pt has a daughter who lives locally but is very limited in ability to support the pt at this time. Pt was able to complete bed mobility with mod I but was only to stand less then 3 minutes prior to reporting they needed to sit down. Pt was then on second attempt was able to take about 4 steps with RW with no LOB at EOB. Pt's tech was in room and placed in BP readings with positional movements as did elevate with changes in position, HR 90-129, o2 on RA 90s. Mr. Arko requires min to mod assist with LB ADLS and set up in sitting for UE ADLS due to weakness. Pt currently with functional limitations due to the deficits listed below (see OT Problem List).  Pt will benefit from skilled OT to increase their safety and independence with ADL and functional mobility for ADL to facilitate discharge to venue listed below.        Recommendations for follow up therapy are one component of a multi-disciplinary discharge planning process, led by the attending physician.  Recommendations may be updated based on patient status, additional functional criteria and insurance authorization.   Follow Up Recommendations  Skilled nursing-short term rehab (<3 hours/day) (vs. HH depding on progress as lives alone and very limited assist)     Assistance Recommended at Discharge Intermittent Supervision/Assistance  Patient can return home with the following A little help with walking  and/or transfers;A little help with bathing/dressing/bathroom;Assistance with cooking/housework;Assist for transportation    Functional Status Assessment  Patient has had a recent decline in their functional status and demonstrates the ability to make significant improvements in function in a reasonable and predictable amount of time.  Equipment Recommendations  Other (comment) (TBD but pt did report they do not think devices will fit ito bathroom space but will need RW)    Recommendations for Other Services       Precautions / Restrictions Precautions Precautions: Fall Precaution Comments: Pt reports atleast 1 fall a week but up to 3 times a week at home. Restrictions Weight Bearing Restrictions: No      Mobility Bed Mobility Overal bed mobility: Modified Independent             General bed mobility comments: increase in time and bed rail use due to feeling of dizziness    Transfers Overall transfer level: Needs assistance Equipment used: Rolling walker (2 wheels) Transfers: Sit to/from Stand Sit to Stand: Min guard, From elevated surface                  Balance Overall balance assessment: Needs assistance Sitting-balance support: Feet supported Sitting balance-Leahy Scale: Good     Standing balance support: Bilateral upper extremity supported, During functional activity, No upper extremity supported Standing balance-Leahy Scale: Fair Standing balance comment: As pt becomes weak requires BUE support  ADL either performed or assessed with clinical judgement   ADL Overall ADL's : Needs assistance/impaired Eating/Feeding: Independent;Sitting Eating/Feeding Details (indicate cue type and reason): on modified diet Grooming: Wash/dry hands;Wash/dry face;Set up;Sitting   Upper Body Bathing: Sitting;Modified independent   Lower Body Bathing: Minimal assistance;Moderate assistance;Sit to/from stand   Upper Body Dressing :  Modified independent;Sitting   Lower Body Dressing: Minimal assistance;Moderate assistance;Sit to/from stand   Toilet Transfer: Min guard;Cueing for safety;Cueing for sequencing;Stand-pivot;Rolling walker (2 wheels);BSC/3in1   Toileting- Clothing Manipulation and Hygiene: Minimal assistance;Cueing for safety;Cueing for sequencing;Sit to/from stand       Functional mobility during ADLs: Min guard;Cueing for safety;Cueing for sequencing;Rolling walker (2 wheels) General ADL Comments: only completed lateral steps at EOB and became very weak     Vision Baseline Vision/History: 1 Wears glasses (reading) Patient Visual Report: No change from baseline Vision Assessment?: No apparent visual deficits     Perception     Praxis      Pertinent Vitals/Pain Pain Assessment Pain Assessment: 0-10 Pain Score: 3  Pain Location: knees Pain Descriptors / Indicators: Aching Pain Intervention(s): Limited activity within patient's tolerance, Monitored during session, Repositioned     Hand Dominance Right   Extremity/Trunk Assessment Upper Extremity Assessment Upper Extremity Assessment: Overall WFL for tasks assessed   Lower Extremity Assessment Lower Extremity Assessment: Defer to PT evaluation   Cervical / Trunk Assessment Cervical / Trunk Assessment: Kyphotic   Communication Communication Communication: No difficulties   Cognition Arousal/Alertness: Awake/alert Behavior During Therapy: WFL for tasks assessed/performed Overall Cognitive Status: Within Functional Limits for tasks assessed                                       General Comments       Exercises     Shoulder Instructions      Home Living Family/patient expects to be discharged to:: Private residence Living Arrangements: Alone   Type of Home: House Home Access: Other (comment) (Pt reports a long entrance way to enter into the home and often has SOB. Pt also says it is uneven.)     Home Layout:  One level     Bathroom Shower/Tub: Tub/shower unit;Curtain   Firefighter: Standard     Home Equipment: Cane - single point          Prior Functioning/Environment Prior Level of Function : Independent/Modified Independent;Driving             Mobility Comments: Pt reports often stays home as has been progressingly getting worse at home and increase in falls with limited support. ADLs Comments: Pt reports they ahve not been able to take a shower in over a year due to        OT Problem List: Decreased strength;Decreased activity tolerance;Impaired balance (sitting and/or standing);Decreased safety awareness;Decreased knowledge of use of DME or AE;Cardiopulmonary status limiting activity;Pain      OT Treatment/Interventions: Self-care/ADL training;Therapeutic exercise;DME and/or AE instruction;Therapeutic activities;Balance training;Patient/family education    OT Goals(Current goals can be found in the care plan section) Acute Rehab OT Goals Patient Stated Goal: to figure out what is going on with me OT Goal Formulation: With patient Time For Goal Achievement: 09/08/22 Potential to Achieve Goals: Good ADL Goals Pt Will Perform Lower Body Bathing: with modified independence;with adaptive equipment Pt Will Perform Lower Body Dressing: with modified independence;sit to/from stand;with adaptive equipment Pt Will Transfer to Toilet: with modified  independence;ambulating Additional ADL Goal #1: Pt will tolerate standing ADLS for 3 mins to be able to complete peri care with no LOB  OT Frequency: Min 2X/week    Co-evaluation              AM-PAC OT "6 Clicks" Daily Activity     Outcome Measure Help from another person eating meals?: None Help from another person taking care of personal grooming?: A Little Help from another person toileting, which includes using toliet, bedpan, or urinal?: A Little Help from another person bathing (including washing, rinsing, drying)?: A  Lot Help from another person to put on and taking off regular upper body clothing?: A Little Help from another person to put on and taking off regular lower body clothing?: A Lot 6 Click Score: 17   End of Session Equipment Utilized During Treatment: Gait belt;Rolling walker (2 wheels) Nurse Communication: Mobility status  Activity Tolerance: Patient limited by fatigue Patient left: in bed;with call bell/phone within reach;with bed alarm set  OT Visit Diagnosis: Unsteadiness on feet (R26.81);Other abnormalities of gait and mobility (R26.89);Repeated falls (R29.6);Muscle weakness (generalized) (M62.81);History of falling (Z91.81);Pain Pain - part of body: Knee                Time: 0731-0808 OT Time Calculation (min): 37 min Charges:  OT General Charges $OT Visit: 1 Visit OT Evaluation $OT Eval Low Complexity: 1 Low OT Treatments $Self Care/Home Management : 8-22 mins  Alphia Moh OTR/L  Acute Rehab Services  [phone] office number    Alphia Moh 08/25/2022, 8:23 AM

## 2022-08-25 NOTE — Progress Notes (Addendum)
Daily Progress Note Intern Pager: [phone]  Patient name: John Fisher Medical record number: [id number] Date of birth: October 27, 1955 Age: 66 y.o. Gender: male  Primary Care Provider: [facility] Consultants: None Code Status: Full code   Pt Overview and Major Events to Date:  12/11: Admitted to FMTS service  Assessment and Plan: John Fisher is a 66 y.o. male presenting with multiple episodes of syncope in the setting of poor PO intake due to dental pain and depression, macrocytic anemia and dehydration with concomitant alcohol use disorder. PMHx includes HTN, T2DM, MDD, alcohol use disorder and THC use.   * Syncope Multifactorial in nature, stemming from prolonged poor PO intake due to dental pain and swallowing difficulties. Significant orthostasis with dehydration in the setting chronic alcohol use disorder and low fluid intake. Macrocytic anemia suggestive of vitamin B12 deficiency and malnutrition. Echo showed EF 60-65%. UA neg and UDS +THC.  -Nutrition consulted, appreciate recommendations -SLP consulted, scheduled for FEES today -Hgb trending down 11>9.5, trend CBC -PT/OT eval and treat -Tylenol 650mg  q6h prn dental pain -Consider outpatient colonoscopy given anemia, weight loss and lack of recent screening in the chart  Diabetes mellitus without complication (HCC) A1c 5.2. BGL normal today. Endorses some episodes of hypoglycemia at home. -Metformin 500mg  daily -Monitor for hypoglycemia symptoms  Alcohol use disorder Currently drinks 1 pint whiskey/day for the past year. Denies h/o DT or withdrawal seizures. CIWA 3>0. -CIWA protocol -Thiamine, multivitamin, folate daily -TOC for alcohol use disorder and THC use   Chronic conditions: HLD: continue Lipitor 40mg  HTN: hold home amlodipine (normotensive)   FEN/GI: Dysphagia 1 diet (soft), thin liquids VTE Prophylaxis: Lovenox Dispo: SNF pending further syncope evaluation  Subjective:  Patient assessed  at bedside. States he has been up to use the restroom and tolerated it well. States as long as he takes his time getting up the dizziness is manageable but he is dizzy every time he tries to stand up. Denies any chest pain, SOB or other concerns. Had a BM yesterday and urinating per usual.  Objective: Temp:  [98 F (36.7 C)-98.4 F (36.9 C)] 98.4 F (36.9 C) (12/12 0741) Pulse Rate:  [65-98] 65 (12/12 0130) Resp:  [16-22] 17 (12/12 0741) BP: (100-131)/(63-99) 129/92 (12/12 0741) SpO2:  [92 %-100 %] 99 % (12/12 0741) Weight:  [77.1 kg] 77.1 kg (12/11 1058) Physical Exam: General: Sitting up in bed. Well appearing Cardiovascular: RRR without murmur. Occasional PVCs Respiratory: CTAB. Normal effort on RA Abdomen: Soft, non-tender, non-distended. +BS Extremities: 1+ LE edema bilaterally  Laboratory: Most recent CBC Lab Results  Component Value Date   WBC 5.8 08/25/2022   HGB 9.5 (L) 08/25/2022   HCT 26.8 (L) 08/25/2022   MCV 107.2 (H) 08/25/2022   PLT 156 08/25/2022   Most recent BMP    Latest Ref Rng & Units 08/25/2022    2:53 AM  BMP  Glucose 70 - 99 mg/dL 98   BUN 8 - 23 mg/dL 11   Creatinine 14/08/2022 - 1.24 mg/dL 14/08/2022   Sodium 14/08/2022 - 4.17 mmol/L 142   Potassium 3.5 - 5.1 mmol/L 4.1   Chloride 98 - 111 mmol/L 109   CO2 22 - 32 mmol/L 26   Calcium 8.9 - 10.3 mg/dL 8.5     Other pertinent labs: Troponin: 12 A1c: 5.2 HIV: non-reactive UA: negative UDS: +THC  Imaging/Diagnostic Tests: ECHOCARDIOGRAM COMPLETE  Result Date: 08/24/2022 IMPRESSIONS  1. Left ventricular ejection fraction, by estimation, is 60 to 65%.  The left ventricle has normal function. The left ventricle has no regional wall motion abnormalities. Left ventricular diastolic parameters were normal.  2. Right ventricular systolic function is normal. The right ventricular size is normal.  3. The mitral valve is normal in structure. Trivial mitral valve regurgitation. No evidence of mitral stenosis.  4. The  aortic valve is normal in structure. Aortic valve regurgitation is trivial. No aortic stenosis is present.  5. The inferior vena cava is normal in size with greater than 50% respiratory variability, suggesting right atrial pressure of 3 mmHg. Comparison(s): No prior Echocardiogram. Conclusion(s)/Recommendation(s): Normal biventricular function without evidence of hemodynamically significant valvular heart disease.   DG Chest Portable 1 View  Result Date: 08/24/2022 IMPRESSION: No active disease.   CT Head Wo Contrast  Result Date: 08/24/2022 IMPRESSION: No evidence of intracranial injury.   Elberta Fortis, MD 08/25/2022, 9:25 AM  PGY-1, [facility] FPTS Intern pager: [phone], text pages welcome Secure chat group [facility] Teaching Service

## 2022-08-25 NOTE — Hospital Course (Addendum)
John Fisher is a 66 y.o.male with a history of HTN, T2DM, MDD, alcohol use disorder and THC use who was admitted to the [facility] Medicine Teaching Service at [facility] for syncope. His hospital course is detailed below:  Syncope Presents with four episodes of syncope occurring over a two week period associated with dental pain and weight loss. Likely multifactorial in nature, stemming from prolonged poor PO intake due to dental pain and swallowing difficulties. Orthostasis due to dehydration in the setting chronic alcohol use disorder and low fluid intake. Inpatient dentistry consulted, no concern for infection or need for inpatient workup. SLP consulted, patient had FEES showing severe oral dysphagia likely secondary to dental issues. Recommended outpatient f/u with resolving dental concern. Macrocytic anemia and muscle deconditioning likely due to nutritional deficiency. Nutrition consulted, and patient started on oral supplementation. Discharged to acute rehab per PT/OT.  Other chronic conditions were medically managed with home medications and formulary alternatives as necessary (HLD, HTN)  PCP Follow-up Recommendations:  Alcohol use disorder cessation Outpatient colonoscopy given anemia, weight loss and lack of recent screening in the chart Check CBGs to possibly restart diabetes medications. Repeat CBC and BMP in 1 week  (baseline creatinine near 1.6).

## 2022-08-26 ENCOUNTER — Inpatient Hospital Stay (HOSPITAL_COMMUNITY): Payer: No Typology Code available for payment source

## 2022-08-26 DIAGNOSIS — M1990 Unspecified osteoarthritis, unspecified site: Secondary | ICD-10-CM | POA: Diagnosis present

## 2022-08-26 DIAGNOSIS — R55 Syncope and collapse: Secondary | ICD-10-CM | POA: Diagnosis present

## 2022-08-26 DIAGNOSIS — I10 Essential (primary) hypertension: Secondary | ICD-10-CM | POA: Diagnosis present

## 2022-08-26 DIAGNOSIS — R1311 Dysphagia, oral phase: Secondary | ICD-10-CM | POA: Diagnosis present

## 2022-08-26 DIAGNOSIS — E119 Type 2 diabetes mellitus without complications: Secondary | ICD-10-CM | POA: Diagnosis present

## 2022-08-26 DIAGNOSIS — E291 Testicular hypofunction: Secondary | ICD-10-CM | POA: Diagnosis present

## 2022-08-26 DIAGNOSIS — K029 Dental caries, unspecified: Secondary | ICD-10-CM | POA: Diagnosis present

## 2022-08-26 DIAGNOSIS — E785 Hyperlipidemia, unspecified: Secondary | ICD-10-CM | POA: Diagnosis present

## 2022-08-26 DIAGNOSIS — E860 Dehydration: Secondary | ICD-10-CM | POA: Diagnosis present

## 2022-08-26 DIAGNOSIS — Z833 Family history of diabetes mellitus: Secondary | ICD-10-CM | POA: Diagnosis not present

## 2022-08-26 DIAGNOSIS — F1010 Alcohol abuse, uncomplicated: Secondary | ICD-10-CM | POA: Diagnosis present

## 2022-08-26 DIAGNOSIS — Z7982 Long term (current) use of aspirin: Secondary | ICD-10-CM | POA: Diagnosis not present

## 2022-08-26 DIAGNOSIS — K089 Disorder of teeth and supporting structures, unspecified: Secondary | ICD-10-CM | POA: Diagnosis not present

## 2022-08-26 DIAGNOSIS — E440 Moderate protein-calorie malnutrition: Secondary | ICD-10-CM | POA: Diagnosis present

## 2022-08-26 DIAGNOSIS — K045 Chronic apical periodontitis: Secondary | ICD-10-CM | POA: Diagnosis present

## 2022-08-26 DIAGNOSIS — Z79899 Other long term (current) drug therapy: Secondary | ICD-10-CM | POA: Diagnosis not present

## 2022-08-26 DIAGNOSIS — R531 Weakness: Secondary | ICD-10-CM

## 2022-08-26 DIAGNOSIS — Z96653 Presence of artificial knee joint, bilateral: Secondary | ICD-10-CM | POA: Diagnosis present

## 2022-08-26 DIAGNOSIS — R1310 Dysphagia, unspecified: Secondary | ICD-10-CM | POA: Diagnosis not present

## 2022-08-26 DIAGNOSIS — N400 Enlarged prostate without lower urinary tract symptoms: Secondary | ICD-10-CM | POA: Diagnosis present

## 2022-08-26 DIAGNOSIS — Z8249 Family history of ischemic heart disease and other diseases of the circulatory system: Secondary | ICD-10-CM | POA: Diagnosis not present

## 2022-08-26 DIAGNOSIS — F32A Depression, unspecified: Secondary | ICD-10-CM | POA: Diagnosis present

## 2022-08-26 DIAGNOSIS — Z7984 Long term (current) use of oral hypoglycemic drugs: Secondary | ICD-10-CM | POA: Diagnosis not present

## 2022-08-26 DIAGNOSIS — Z888 Allergy status to other drugs, medicaments and biological substances status: Secondary | ICD-10-CM | POA: Diagnosis not present

## 2022-08-26 DIAGNOSIS — R296 Repeated falls: Secondary | ICD-10-CM | POA: Diagnosis present

## 2022-08-26 DIAGNOSIS — D539 Nutritional anemia, unspecified: Secondary | ICD-10-CM | POA: Diagnosis present

## 2022-08-26 DIAGNOSIS — I951 Orthostatic hypotension: Secondary | ICD-10-CM | POA: Diagnosis not present

## 2022-08-26 DIAGNOSIS — Z6823 Body mass index (BMI) 23.0-23.9, adult: Secondary | ICD-10-CM | POA: Diagnosis not present

## 2022-08-26 LAB — CBC
HCT: 27.2 % — ABNORMAL LOW (ref 39.0–52.0)
Hemoglobin: 9.3 g/dL — ABNORMAL LOW (ref 13.0–17.0)
MCH: 37.3 pg — ABNORMAL HIGH (ref 26.0–34.0)
MCHC: 34.2 g/dL (ref 30.0–36.0)
MCV: 109.2 fL — ABNORMAL HIGH (ref 80.0–100.0)
Platelets: 158 10*3/uL (ref 150–400)
RBC: 2.49 MIL/uL — ABNORMAL LOW (ref 4.22–5.81)
RDW: 12.5 % (ref 11.5–15.5)
WBC: 4.9 10*3/uL (ref 4.0–10.5)
nRBC: 0 % (ref 0.0–0.2)

## 2022-08-26 NOTE — Progress Notes (Signed)
     Daily Progress Note Intern Pager: [phone]  Patient name: John Fisher Medical record number: [id number] Date of birth: Sep 10, 1956 Age: 66 y.o. Gender: male  Primary Care Provider: [facility] Consultants: None Code Status: Full code    Pt Overview and Major Events to Date:  12/11: Admitted to FMTS service   Assessment and Plan: John Fisher is a 66 y.o. male presenting with multiple episodes of syncope in the setting of poor PO intake due to dental pain and depression, macrocytic anemia and dehydration with concomitant alcohol use disorder. PMHx includes HTN, T2DM, MDD, alcohol use disorder and THC use.   * Syncope Multifactorial in nature, stemming from prolonged poor PO intake due to dental pain and swallowing difficulties. Significant orthostasis with dehydration and macrocytic anemia. Hgb stable, 9.3. -Nutrition consulted, start on dietary supplements -SLP consulted, recommend outpatient follow up when dental pain improving -Inpatient dentistry consulted, recommended orthopanogram and will eval patient 12/14 -PT/OT eval and treat -Tylenol 650mg  q6h prn dental pain -Consider outpatient colonoscopy given anemia, weight loss and lack of recent screening in the chart  Alcohol use disorder CIWA 0. -CIWA protocol -Thiamine, multivitamin, folate daily   Orthopanogram - dental to see  Chronic conditions: HLD: continue Lipitor 40mg  HTN: hold home amlodipine (normotensive) DM: continue Metformin 500mg    FEN/GI: Dysphagia 1 diet (soft), thin liquids VTE Prophylaxis: Lovenox Dispo: SNF pending further dental recommendations  Subjective:  Patient assessed at bedside and states he is doing well. States he is looking forward to more rehab and wants to regain strength. States he still isn't eating well  Objective: Temp:  [98 F (36.7 C)-98.5 F (36.9 C)] 98.2 F (36.8 C) (12/13 0550) Pulse Rate:  [61-87] 65 (12/13 0550) Resp:  [18] 18 (12/12 1420) BP:  (102-117)/(61-87) 117/61 (12/13 0550) SpO2:  [97 %] 97 % (12/12 2000) Physical Exam: General: Sitting up in bed. Well appearing Cardiovascular: RRR without murmur. Occasional PVCs Respiratory: CTAB. Normal effort on RA Abdomen: Soft, non-tender, non-distended. +BS Extremities: Trace LE edema bilaterally  Laboratory: Most recent CBC Lab Results  Component Value Date   WBC 4.9 08/26/2022   HGB 9.3 (L) 08/26/2022   HCT 27.2 (L) 08/26/2022   MCV 109.2 (H) 08/26/2022   PLT 158 08/26/2022   Most recent BMP    Latest Ref Rng & Units 08/25/2022    2:53 AM  BMP  Glucose 70 - 99 mg/dL 98   BUN 8 - 23 mg/dL 11   Creatinine 08/28/2022 - 1.24 mg/dL 08/28/2022   Sodium 14/08/2022 - 2.95 mmol/L 142   Potassium 3.5 - 5.1 mmol/L 4.1   Chloride 98 - 111 mmol/L 109   CO2 22 - 32 mmol/L 26   Calcium 8.9 - 10.3 mg/dL 8.5      2.84, MD 08/26/2022, 12:56 PM  PGY-1, [place] [facility] FPTS Intern pager: [phone], text pages welcome Secure chat group [facility] Teaching Service

## 2022-08-26 NOTE — TOC Initial Note (Addendum)
Transition of Care [facility]) - Initial/Assessment Note    Patient Details  Name: John Fisher MRN: [id number] Date of Birth: [birth year]  Transition of Care [facility] CM/SW Contact:    Joanne Chars, LCSW Phone Number: 08/26/2022, 12:01 PM  Clinical Narrative:     CSW met with pt regarding DC recommendation for SNF.  Pt is agreeable to this, choice document given, permission given to send out referral in hub.  Pt lives alone, no current services.  Permission given to speak with daughter Maudie Mercury.  Pt has had covid vaccine shots, does not think he has been boosted.  Pt goes to [employer].  Also has traditional medicare A and B, showed CSW the card in his wallet.  Pt is agreeable to use his medicare for SNF and prefers the option of SNF locally rather than [facility] options farther away.             1620: CSW met with pt and daughter Maudie Mercury.  They have some questions about pt service connection with the [place].  CSW called [facility], Kellie Simmering listed as pt [place], ext [phone].  CSW LM.  Expected Discharge Plan: Skilled Nursing Facility Barriers to Discharge: Continued Medical Work up, SNF Pending bed offer   Patient Goals and CMS Choice Patient states their goals for this hospitalization and ongoing recovery are:: walk without passing out   Choice offered to / list presented to : Patient  Expected Discharge Plan and Services Expected Discharge Plan: [place] In-house Referral: Clinical Social Work   Post Acute Care Choice: [place] Living arrangements for the past 2 months: Apartment                                      Prior Living Arrangements/Services Living arrangements for the past 2 months: Apartment Lives with:: Self Patient language and need for interpreter reviewed:: Yes Do you feel safe going back to the place where you live?: Yes      Need for Family Participation in Patient Care: No (Comment) Care giver support system in place?: Yes  (comment) Current home services: Other (comment) (none) Criminal Activity/Legal Involvement Pertinent to Current Situation/Hospitalization: No - Comment as needed  Activities of Daily Living      Permission Sought/Granted Permission sought to share information with : Family Supports Permission granted to share information with : Yes, Verbal Permission Granted  Share Information with NAME: daughter Maudie Mercury  Permission granted to share info w AGENCY: SNF        Emotional Assessment Appearance:: Appears stated age Attitude/Demeanor/Rapport: Engaged Affect (typically observed): Appropriate, Pleasant Orientation: : Oriented to Self, Oriented to Place, Oriented to  Time, Oriented to Situation Alcohol / Substance Use: Alcohol Use Psych Involvement: No (comment)  Admission diagnosis:  Dehydration [E86.0] Syncope [R55] Orthostatic syncope [I95.1] Weakness [R53.1] Patient Active Problem List   Diagnosis Date Noted   Weakness 08/26/2022   Malnutrition of moderate degree 08/25/2022   Poor dentition 08/25/2022   Alcohol use disorder 08/24/2022   Dysphagia 08/24/2022   Perennial and seasonal allergic rhinitis 12/25/2019   BPH (benign prostatic hyperplasia) 08/07/2015   Syncope 08/07/2015   Syncope and collapse 08/07/2015   Dyslipidemia 05/18/2013   Shoulder pain, right 02/10/2011   Hypertension 02/10/2011   Insomnia 02/10/2011   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   HYPOGONADISM 08/19/2010   VITAMIN B12 DEFICIENCY 08/19/2010   TESTICULAR PAIN 08/19/2010  FATIGUE 08/19/2010   SNORING 08/19/2010   SKIN RASH, ALLERGIC 01/08/2009   Diabetes mellitus type 2, uncontrolled ([place]) 12/11/2008   VITAMIN D DEFICIENCY 12/13/2007   ERECTILE DYSFUNCTION 12/13/2007   Osteoarthritis 12/13/2007   FOOT PAIN 12/13/2007   ABNORMAL GLUCOSE NEC 12/13/2007   PCP:  Clinic, [place]:   [place], [place] [address] Phone: [phone] Fax: [phone]  [facility] ([place], [place] [facility] [address] [place] [address] Phone: [phone] Fax: [place], [place] [address] Phone: [phone] Fax: [place], [place] [address] [place] [address] Phone: [phone] Fax: [phone]     Social Determinants of Health (SDOH) Interventions    Readmission Risk Interventions     No data to display

## 2022-08-26 NOTE — [place] FL2 (Signed)
[place] MEDICAID FL2 LEVEL OF CARE FORM     IDENTIFICATION  Patient Name: John Fisher Birthdate: May 16, 1956 Sex: male Admission Date (Current Location): 08/24/2022  [facility] and [place] Number:  Producer, television/film/video and Address:  [facility] [place], [address], [place], [place] [address]      Provider Number: [id number]  Attending Physician Name and Address:  Latrelle Dodrill, MD  Relative Name and Phone Number:  kashten, gowin Daughter   [phone]    Current Level of Care: Hospital Recommended Level of Care: Skilled Nursing Facility Prior Approval Number:    Date Approved/Denied:   PASRR Number: [id number]  Discharge Plan: SNF    Current Diagnoses: Patient Active Problem List   Diagnosis Date Noted   Weakness 08/26/2022   Malnutrition of moderate degree 08/25/2022   Poor dentition 08/25/2022   Alcohol use disorder 08/24/2022   Dysphagia 08/24/2022   Perennial and seasonal allergic rhinitis 12/25/2019   BPH (benign prostatic hyperplasia) 08/07/2015   Syncope 08/07/2015   Syncope and collapse 08/07/2015   Dyslipidemia 05/18/2013   Shoulder pain, right 02/10/2011   Hypertension 02/10/2011   Insomnia 02/10/2011   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   HYPOGONADISM 08/19/2010   VITAMIN B12 DEFICIENCY 08/19/2010   TESTICULAR PAIN 08/19/2010   FATIGUE 08/19/2010   SNORING 08/19/2010   SKIN RASH, ALLERGIC 01/08/2009   Diabetes mellitus type 2, uncontrolled (HCC) 12/11/2008   VITAMIN D DEFICIENCY 12/13/2007   ERECTILE DYSFUNCTION 12/13/2007   Osteoarthritis 12/13/2007   FOOT PAIN 12/13/2007   ABNORMAL GLUCOSE NEC 12/13/2007    Orientation RESPIRATION BLADDER Height & Weight     Self, Time, Situation, Place  Normal Continent Weight: 170 lb (77.1 kg) Height:  6' (182.9 cm)  BEHAVIORAL SYMPTOMS/MOOD NEUROLOGICAL BOWEL NUTRITION STATUS      Continent Diet (see discharge summary)  AMBULATORY STATUS COMMUNICATION OF NEEDS Skin    Supervision Verbally Normal                       Personal Care Assistance Level of Assistance  Bathing, Feeding, Dressing Bathing Assistance: Limited assistance Feeding assistance: Independent Dressing Assistance: Limited assistance     Functional Limitations Info  Sight, Hearing, Speech Sight Info: Adequate Hearing Info: Adequate Speech Info: Adequate    SPECIAL CARE FACTORS FREQUENCY  PT (By licensed PT), OT (By licensed OT)     PT Frequency: 5x week OT Frequency: 5x week            Contractures Contractures Info: Not present    Additional Factors Info  Code Status, Allergies Code Status Info: full Allergies Info: Trospium, Alfuzosin           Current Medications (08/26/2022):  This is the current hospital active medication list Current Facility-Administered Medications  Medication Dose Route Frequency Provider Last Rate Last Admin   aspirin EC tablet 81 mg  81 mg Oral Daily Elberta Fortis, MD   81 mg at 08/26/22 0842   atorvastatin (LIPITOR) tablet 10 mg  10 mg Oral Daily Elberta Fortis, MD   10 mg at 08/26/22 0843   enoxaparin (LOVENOX) injection 40 mg  40 mg Subcutaneous Q24H Elberta Fortis, MD   40 mg at 08/25/22 1633   feeding supplement (ENSURE ENLIVE / ENSURE PLUS) liquid 237 mL  237 mL Oral TID BM Latrelle Dodrill, MD   237 mL at 08/26/22 1000   folic acid (FOLVITE) tablet 1 mg  1 mg Oral Daily Ardyth Harps,  Katie, MD   1 mg at 08/26/22 0849   metFORMIN (GLUCOPHAGE-XR) 24 hr tablet 500 mg  500 mg Oral Q breakfast Elberta Fortis, MD   500 mg at 08/26/22 5638   multivitamin with minerals tablet 1 tablet  1 tablet Oral Daily Elberta Fortis, MD   1 tablet at 08/26/22 7564   thiamine (VITAMIN B1) tablet 100 mg  100 mg Oral Daily Elberta Fortis, MD   100 mg at 08/26/22 3329   Or   thiamine (VITAMIN B1) injection 100 mg  100 mg Intravenous Daily Elberta Fortis, MD   100 mg at 08/24/22 1723     Discharge Medications: Please see discharge  summary for a list of discharge medications.  Relevant Imaging Results:  Relevant Lab Results:   Additional Information SSN: [id number].  Pt is vaccinated for covid, unsure if boosters.  Lorri Frederick, LCSW

## 2022-08-27 ENCOUNTER — Encounter (HOSPITAL_COMMUNITY): Payer: Self-pay | Admitting: Family Medicine

## 2022-08-27 DIAGNOSIS — D539 Nutritional anemia, unspecified: Secondary | ICD-10-CM

## 2022-08-27 DIAGNOSIS — I951 Orthostatic hypotension: Secondary | ICD-10-CM

## 2022-08-27 DIAGNOSIS — Z0120 Encounter for dental examination and cleaning without abnormal findings: Secondary | ICD-10-CM

## 2022-08-27 DIAGNOSIS — K036 Deposits [accretions] on teeth: Secondary | ICD-10-CM

## 2022-08-27 DIAGNOSIS — K08109 Complete loss of teeth, unspecified cause, unspecified class: Secondary | ICD-10-CM

## 2022-08-27 DIAGNOSIS — R55 Syncope and collapse: Secondary | ICD-10-CM | POA: Diagnosis not present

## 2022-08-27 DIAGNOSIS — K045 Chronic apical periodontitis: Secondary | ICD-10-CM

## 2022-08-27 DIAGNOSIS — K029 Dental caries, unspecified: Secondary | ICD-10-CM

## 2022-08-27 DIAGNOSIS — R1310 Dysphagia, unspecified: Secondary | ICD-10-CM | POA: Diagnosis not present

## 2022-08-27 LAB — GLUCOSE, CAPILLARY: Glucose-Capillary: 120 mg/dL — ABNORMAL HIGH (ref 70–99)

## 2022-08-27 MED ORDER — LACTATED RINGERS IV SOLN: INTRAVENOUS | Status: DC

## 2022-08-27 MED ORDER — TAMSULOSIN HCL 0.4 MG PO CAPS
0.4000 mg | ORAL_CAPSULE | Freq: Every day | ORAL | Status: DC
  Administered 2022-08-27 – 2022-09-01 (×6): 0.4 mg via ORAL
  Filled 2022-08-27 (×6): qty 1

## 2022-08-27 NOTE — TOC Progression Note (Addendum)
Transition of Care [facility]) - Progression Note    Patient Details  Name: BRODY BONNEAU MRN: [id number] Date of Birth: [birth year]  Transition of Care [facility]) CM/SW Contact  Lorri Frederick, LCSW Phone Number: 08/27/2022, 1:25 PM  Clinical Narrative:   Bed offers presented to pt--he wants to accept offer at [facility].  CSW sent message to [facility] to confirm.  CSW also LM with [facility] CSW [place] again.  Asked her to reach out to pt or daughter.  Also provided her phone number to pt and he or daughter will also follow up if they do not hear from her.  They are hoping this hospitalization will help pt become 100% service connected.    1445: TC [facility].  They can take pt but not until Monday.   Expected Discharge Plan: Skilled Nursing Facility Barriers to Discharge: Continued Medical Work up, SNF Pending bed offer  Expected Discharge Plan and Services Expected Discharge Plan: Skilled Nursing Facility In-house Referral: Clinical Social Work   Post Acute Care Choice: Skilled Nursing Facility Living arrangements for the past 2 months: Apartment                                       Social Determinants of Health (SDOH) Interventions    Readmission Risk Interventions     No data to display

## 2022-08-27 NOTE — Assessment & Plan Note (Addendum)
Stable, likely secondary to nutritional deficiency. -Continue folate supplementation

## 2022-08-27 NOTE — Consult Note (Signed)
[facility] Department of Dental Medicine      INPATIENT CONSULTATION    PLAN/RECOMMENDATIONS     ASSESSMENT: There are no current signs of acute odontogenic infection including abscess, edema or erythema, or suspicious lesion requiring biopsy.   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 He has gone to the dentist routinely for care up until recently. Establish dental care at an outside office of the patient's choice for routine care including cleanings/periodontal therapy and periodic exams. Discussed with patient that I am happy to provide him with a list of local dental providers who accept his insurance if he decides not to return to the [place].     PLAN: Discuss case with medical team/follow-up as needed.  Discussed in detail all treatment options and recommendations with the patient and they are agreeable to the plan.   Thank you for consulting with Hospital Dentistry and for the opportunity to participate in this patient's treatment.  Should you have any questions or concerns, please contact the [facility] at [phone].    Service Date:   08/27/2022 Admit Date:   08/24/2022  Patient Name:   DEAUNTE DENTE Date of Birth:   09/20/1955 Medical Record Number: [id number]  Referring Provider:          Levert Feinstein, MD   HISTORY OF PRESENT ILLNESS: EDVARDO HONSE is a very pleasant 66 y.o. male with history of HTN, sleep apnea, type 2 diabetes mellitus, osteoarthritis, dyslipidemia, anemia and allergic rhinitis who is currently admitted for new onset dysphagia, syncopal events and malnutrition due to poor PO intake.   Hospital dentistry was consulted to complete a medically-necessary dental evaluation to  determine if the patient's current medical conditions are due to poor dentition and/or pain.   DENTAL HISTORY: The patient reports that he goes to the [facility] in [place] for comprehensive dental care until very recently when his original dentist there left the practice. He states that he did return and started seeing the new dentist, however he was told that they could no longer complete his dental treatment plan there.  He did recently have several teeth extracted and does have some implants. He reports that he has issues biting with his front teeth and also chewing on the left side since he does not have any posterior teeth on the left. He says that he primarily chews on the right side.  He said that tooth #21 (points to this tooth) recently broke and it does hurt when anything touches the tooth, but denies any acute swelling/pain.  He reports the oral pain and discomfort he has been having is of his gums when he chews, not the surrounding teeth. Patient is able to manage oral secretions.  Patient denies odynophagia and dysphonia.   CHIEF COMPLAINT:  Inpatient dental consult.   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   HYPOGONADISM 08/19/2010   VITAMIN B12 DEFICIENCY 08/19/2010   TESTICULAR PAIN 08/19/2010   FATIGUE 08/19/2010   SNORING 08/19/2010   SKIN RASH, ALLERGIC 01/08/2009   Diabetes mellitus type 2, uncontrolled (HCC) 12/11/2008   VITAMIN D DEFICIENCY 12/13/2007   ERECTILE DYSFUNCTION 12/13/2007   Osteoarthritis  12/13/2007   FOOT PAIN 12/13/2007   ABNORMAL GLUCOSE NEC 12/13/2007   Past Medical History:  Diagnosis Date   Diabetes mellitus    Erectile dysfunction    Hypertension    Hypogonadism male    Osteoarthritis    Past Surgical History:  Procedure Laterality Date   left knee replacement  02/13/2016   REPLACEMENT TOTAL KNEE Right    Allergies  Allergen Reactions   Trospium     Other Reaction(s): Retention of urine   Alfuzosin Anxiety   Current Facility-Administered Medications  Medication Dose Route Frequency Provider Last Rate Last Admin   aspirin EC tablet 81 mg  81 mg Oral Daily Elberta FortisHernandez, Katie, MD   81 mg at 08/27/22 0932   atorvastatin (LIPITOR) tablet 10 mg  10 mg Oral Daily Elberta FortisHernandez, Katie, MD   10 mg at 08/27/22 0932   enoxaparin (LOVENOX) injection 40 mg  40 mg Subcutaneous Q24H Elberta FortisHernandez, Katie, MD   40 mg at 08/26/22 1554   feeding supplement (ENSURE ENLIVE / ENSURE PLUS) liquid 237 mL  237 mL Oral TID BM Latrelle DodrillMcIntyre, Brittany J, MD   237 mL at 08/27/22 0931   folic acid (FOLVITE) tablet 1 mg  1 mg Oral Daily Elberta FortisHernandez, Katie, MD   1 mg at 08/27/22 0932   lactated ringers infusion   Intravenous Continuous Lockie MolaBoyina, Akhila, MD 75 mL/hr at 08/27/22 1147 New Bag at 08/27/22 1147   multivitamin with minerals tablet 1 tablet  1 tablet Oral Daily Elberta FortisHernandez, Katie, MD   1 tablet at 08/27/22 0932   tamsulosin (FLOMAX) capsule 0.4 mg  0.4 mg Oral QPC supper Lockie MolaBoyina, Akhila, MD       thiamine (VITAMIN B1) tablet 100 mg  100 mg Oral Daily Elberta FortisHernandez, Katie, MD   100 mg at 08/27/22 0932   Or   thiamine (VITAMIN B1) injection 100 mg  100 mg Intravenous Daily Elberta FortisHernandez, Katie, MD   100 mg at 08/24/22 1723    LABS: Lab Results  Component Value Date   WBC 4.9 08/26/2022   HGB 9.3 (L) 08/26/2022   HCT 27.2 (L) 08/26/2022   MCV 109.2 (H) 08/26/2022   PLT 158 08/26/2022      Component Value Date/Time   NA 142 08/25/2022 0253   K 4.1 08/25/2022 0253   CL 109 08/25/2022 0253   CO2 26  08/25/2022 0253   GLUCOSE 98 08/25/2022 0253   GLUCOSE 117 (H) 08/31/2006 1055   BUN 11 08/25/2022 0253   CREATININE 1.65 (H) 08/25/2022 0253   CALCIUM 8.5 (L) 08/25/2022 0253   GFRNONAA 46 (L) 08/25/2022 0253   GFRAA >60 06/10/2018 1600   No results found for: "INR", "PROTIME" No results found for: "PTT"  Social History   Socioeconomic History   Marital status: Married    Spouse name: Not on file   Number of children: Not on file   Years of education: Not on file   Highest education level: Not on file  Occupational History   Not on file  Tobacco Use   Smoking status: Never   Smokeless tobacco: Never  Vaping Use   Vaping Use: Never used  Substance and  Sexual Activity   Alcohol use: Yes    Alcohol/week: 14.0 standard drinks of alcohol    Types: 14 Standard drinks or equivalent per week   Drug use: Yes    Types: Marijuana   Sexual activity: Yes  Other Topics Concern   Not on file  Social History Narrative   Not on file   Social Determinants of Health   Financial Resource Strain: Not on file  Food Insecurity: Not on file  Transportation Needs: Not on file  Physical Activity: Not on file  Stress: Not on file  Social Connections: Not on file  Intimate Partner Violence: Not on file   Family History  Problem Relation Age of Onset   Diabetes Mother    Hypertension Father    Diabetes Father    Diabetes Brother      REVIEW OF SYSTEMS:  Reviewed with the patient as per HPI. PSYCH: Patient denies having dental phobia.     VITAL SIGNS: BP 115/86 (BP Location: Left Arm)   Pulse 92   Temp 98.8 F (37.1 C) (Oral)   Resp 16   Ht 6' (1.829 m)   Wt 77.1 kg   SpO2 99%   BMI 23.06 kg/m      PHYSICAL EXAM: GENERAL:  Well-developed, comfortable and in no apparent distress. NEUROLOGICAL:  Alert and oriented to person, place and  time. EXTRAORAL:  Facial symmetry present without any edema or erythema.  No swelling or lymphadenopathy.  TMJ asymptomatic without  clicks or crepitations. INTRAORAL:  Soft tissues appear well-perfused and mucous membranes moist.  FOM and vestibules soft and not raised. Oral cavity without mass or lesion. No signs of infection, parulis, sinus tract, edema or erythema evident upon exam.     DENTAL EXAM: Clinical findings charted.    OVERALL IMPRESSION:  Fair remaining dentition.       ORAL HYGIENE:  Poor  PERIODONTAL:  Pink, healthy gingival tissue with blunted papilla with localized areas of inflamed and erythematous tissue.   Localized plaque and calculus accumulation. CARIES:  #21 PROSTHODONTICS:  4 existing implants; 2 on upper right, 1 lower left and 1 lower right.  Upper left and lower right implants have implant-crowns and lower left has only healing abutment (visible clinically). OCCLUSION:  Unable to assess molar occlusion. OTHER FINDINGS:   [+] Attrition/wear:  #5, #6, #7, #9, #10 and #11 occlusal/incisal wear   RADIOGRAPHIC EXAM:   08/26/2022 Orthopantogram reviewed and interpreted. Condyles seated bilaterally in fossas.  No evidence of abnormal pathology.  All visualized osseous structures appear WNL.  Implants present in the upper left (#14-#15), lower left (#19) and lower right (#30); implant-crowns noted on #14, #15 and #30 (possibly fractured #30).  Missing teeth #'s 1-3, #8, #12, #14-#16, #17-20, #24-#26, #28-#32.  #21 has periapical radiolucency.   ASSESSMENT:  1.  Dysphagia, poor PO intake  2.  Inpatient dental evaluation 3.  Missing teeth 4.  Caries 5.  Chronic apical periodontitis 6.  Attrition/wear 7.  Accretions on teeth   PLAN AND RECOMMENDATIONS: I discussed the risks, benefits, and complications of various scenarios with the patient in relationship to their medical and dental conditions. I explained all significant findings of the dental consultation with the patient including tooth #21 which will need to be extracted eventually, however the rest of his teeth appear to be okay as well  as his implants that he has. I explained that because he is missing several teeth, it will be difficult to chew or bite with  the front teeth, however his poor oral intake and dysphagia/issues swallowing are very likely not due to his teeth, and there may be something else causing this.  The patient verbalized understanding of all findings, discussion, and recommendations.  Plan to discuss all findings and recommendations with medical team.  I told the patient that should he not be able to return to the [facility] for his regular dental care, I am happy to provide him with a list of local dental providers who accept his insurance.  He verbalized understanding and is agreeable to this plan. Follow-up as needed.  All questions and concerns were invited and addressed.  The patient tolerated today's visit well.   Alda Berthold, DMD

## 2022-08-27 NOTE — Progress Notes (Signed)
Physical Therapy Treatment Patient Details Name: John Fisher MRN: [id number] DOB: 12-Jan-1956 Today's Date: 08/27/2022   History of Present Illness Pt is a 66 yr old male who presented due to multiple syncope episodes and has had decrease in PO intake due to dental work. PMH: HTN, T2DM, MDD, alcohol use disorder and THC use.    PT Comments    Pt has been seen for progressive gait and endurance skills, now standing and walking well with RW in room.  Pt is somewhat debilitated by poor intake, but also poor activity completed.  Given his need to increase further mobility will recommend him to SNF for encouragement of out of bed, of increasing LE strength and to increase his walking distances for home with independence.  Follow as previously for acute PT goals.  Recommendations for follow up therapy are one component of a multi-disciplinary discharge planning process, led by the attending physician.  Recommendations may be updated based on patient status, additional functional criteria and insurance authorization.  Follow Up Recommendations  Skilled nursing-short term rehab (<3 hours/day) Can patient physically be transported by private vehicle: No   Assistance Recommended at Discharge Frequent or constant Supervision/Assistance  Patient can return home with the following A little help with walking and/or transfers;A little help with bathing/dressing/bathroom;Assistance with cooking/housework;Direct supervision/assist for medications management;Direct supervision/assist for financial management;Assist for transportation;Help with stairs or ramp for entrance   Equipment Recommendations  Rolling walker (2 wheels)    Recommendations for Other Services       Precautions / Restrictions Precautions Precautions: Fall Precaution Comments: frequent falls at home Restrictions Weight Bearing Restrictions: No     Mobility  Bed Mobility Overal bed mobility: Modified Independent Bed Mobility:  Supine to Sit     Supine to sit: Supervision     General bed mobility comments: supervised to side of bed but HOB elevated    Transfers Overall transfer level: Needs assistance Equipment used: Rolling walker (2 wheels) Transfers: Sit to/from Stand Sit to Stand: Min guard, Min assist                Ambulation/Gait Ambulation/Gait assistance: Land (Feet): 45 Feet Assistive device: Rolling walker (2 wheels) Gait Pattern/deviations: Step-through pattern, Decreased stride length Gait velocity: reduced   Pre-gait activities: standing balance General Gait Details: gait is more controlled with less buckling noted   Stairs             Wheelchair Mobility    Modified Rankin (Stroke Patients Only)       Balance Overall balance assessment: Needs assistance Sitting-balance support: Feet supported Sitting balance-Leahy Scale: Good     Standing balance support: Bilateral upper extremity supported, During functional activity Standing balance-Leahy Scale: Fair                              Cognition Arousal/Alertness: Awake/alert Behavior During Therapy: WFL for tasks assessed/performed Overall Cognitive Status: Within Functional Limits for tasks assessed                                          Exercises      General Comments General comments (skin integrity, edema, etc.): pt is increasing his endurance slowly and demonstrating a better control of his balance skills with increased effort to balance and walk      Pertinent  Vitals/Pain Pain Assessment Pain Assessment: No/denies pain    Home Living                          Prior Function            PT Goals (current goals can now be found in the care plan section) Acute Rehab PT Goals Patient Stated Goal: to walk safely and go home Progress towards PT goals: Progressing toward goals    Frequency    Min 3X/week      PT Plan Current  plan remains appropriate    Co-evaluation              AM-PAC PT "6 Clicks" Mobility   Outcome Measure  Help needed turning from your back to your side while in a flat bed without using bedrails?: A Little Help needed moving from lying on your back to sitting on the side of a flat bed without using bedrails?: A Little Help needed moving to and from a bed to a chair (including a wheelchair)?: A Little Help needed standing up from a chair using your arms (e.g., wheelchair or bedside chair)?: A Little Help needed to walk in hospital room?: A Little Help needed climbing 3-5 steps with a railing? : A Lot 6 Click Score: 17    End of Session Equipment Utilized During Treatment: Gait belt Activity Tolerance: Patient limited by fatigue Patient left: in bed;with call bell/phone within reach;with bed alarm set Nurse Communication: Mobility status;Other (comment) PT Visit Diagnosis: Unsteadiness on feet (R26.81);Muscle weakness (generalized) (M62.81);Repeated falls (R29.6)     Time: 2035-5974 PT Time Calculation (min) (ACUTE ONLY): 30 min  Charges:  $Gait Training: 8-22 mins $Therapeutic Activity: 8-22 mins        Ivar Drape 08/27/2022, 5:16 PM  Samul Dada, PT PhD Acute Rehab Dept. Number: [facility] [id number] and [facility] [phone]

## 2022-08-27 NOTE — Progress Notes (Signed)
     Daily Progress Note Intern Pager: [phone]  Patient name: John Fisher Medical record number: [id number] Date of birth: [birth year] Age: 66 y.o. Gender: male  Primary Care Provider: [facility] Consultants: Dentistry Code Status: Full code    Pt Overview and Major Events to Date:  12/11: Admitted to FMTS service   Assessment and Plan: DARRION WYSZYNSKI is a 66 y.o. male presenting with multiple episodes of syncope in the setting of poor PO intake due to dental pain and depression, macrocytic anemia and dehydration with concomitant alcohol use disorder. PMHx includes HTN, T2DM, MDD, alcohol use disorder and THC use.   * Syncope Multifactorial in nature, stemming from prolonged poor PO intake due to dental pain and swallowing difficulties. Persistent orthostasis with difficulty eating. -Start MIVF @ 75/hr for rehydration -Inpatient dentistry consulted, recommended orthopanogram and will eval patient 12/14 -Nutrition consulted, continue dietary supplements -SLP consulted, recommend outpatient follow up when dental pain improving -PT/OT eval and treat -Tylenol 650mg  q6h prn dental pain -CBG q12h  Macrocytic anemia Hgb 9.3, stable. Likely in the setting of nutritional deficiency. On folate supplementation. -Check B12 level  Alcohol use disorder-resolved as of 08/27/2022 CIWAs stable. D/c protocol.   Chronic conditions: HLD: continue Lipitor 40mg  HTN: hold home amlodipine (normotensive) DM: discontinue Metformin 500mg  BPH: restart Flomax   FEN/GI: Dysphagia 1 diet (soft), thin liquids VTE Prophylaxis: Lovenox Dispo: SNF pending further dental recommendations  Subjective:  Patient assessed at bedside. States he still feels very dizzy when standing up and states his oral intake has not improved. States he felt better after getting fluids previously. Otherwise states he still only "dribbles" when urinating.  Objective: Temp:  [98.3 F (36.8 C)-99 F (37.2 C)]  98.8 F (37.1 C) (12/14 1133) Pulse Rate:  [79-101] 92 (12/14 1133) Resp:  [16-18] 16 (12/14 1133) BP: (115-139)/(86-102) 115/86 (12/14 1133) SpO2:  [98 %-100 %] 99 % (12/14 0750) Physical Exam: General: Sitting up in bed. Well appearing HEENT: Very poor dentition with many missing teeth Cardiovascular: RRR without murmur. Occasional PVCs Respiratory: CTAB. Normal effort on RA Abdomen: Soft, non-tender, non-distended. +BS Extremities: Trace LE edema bilaterally.  Laboratory: Most recent CBC Lab Results  Component Value Date   WBC 4.9 08/26/2022   HGB 9.3 (L) 08/26/2022   HCT 27.2 (L) 08/26/2022   MCV 109.2 (H) 08/26/2022   PLT 158 08/26/2022   Most recent BMP    Latest Ref Rng & Units 08/25/2022    2:53 AM  BMP  Glucose 70 - 99 mg/dL 98   BUN 8 - 23 mg/dL 11   Creatinine 08/28/2022 - 1.24 mg/dL 08/28/2022   Sodium 14/08/2022 - 4.27 mmol/L 142   Potassium 3.5 - 5.1 mmol/L 4.1   Chloride 98 - 111 mmol/L 109   CO2 22 - 32 mmol/L 26   Calcium 8.9 - 10.3 mg/dL 8.5     Imaging/Diagnostic Tests: DG Orthopantogram  Result Date: 08/26/2022 IMPRESSION: 1. Extremely poor dentition, with large erosion of the left inferior premolar as well as lucency surrounding the root.     376, MD 08/27/2022, 12:06 PM  PGY-1, [facility] Family Medicine FPTS Intern pager: [phone], text pages welcome Secure chat group [facility] Teaching Service

## 2022-08-28 DIAGNOSIS — I951 Orthostatic hypotension: Secondary | ICD-10-CM | POA: Diagnosis not present

## 2022-08-28 LAB — HEPATIC FUNCTION PANEL
ALT: 27 U/L (ref 0–44)
AST: 36 U/L (ref 15–41)
Albumin: 2.3 g/dL — ABNORMAL LOW (ref 3.5–5.0)
Alkaline Phosphatase: 39 U/L (ref 38–126)
Bilirubin, Direct: <0.1 mg/dL (ref 0.0–0.2)
Total Bilirubin: 0.1 mg/dL — ABNORMAL LOW (ref 0.3–1.2)
Total Protein: 5.1 g/dL — ABNORMAL LOW (ref 6.5–8.1)

## 2022-08-28 LAB — CBC WITH DIFFERENTIAL/PLATELET
Abs Immature Granulocytes: 0.02 10*3/uL (ref 0.00–0.07)
Basophils Absolute: 0 10*3/uL (ref 0.0–0.1)
Basophils Relative: 0 %
Eosinophils Absolute: 0.1 10*3/uL (ref 0.0–0.5)
Eosinophils Relative: 1 %
HCT: 27.2 % — ABNORMAL LOW (ref 39.0–52.0)
Hemoglobin: 9.6 g/dL — ABNORMAL LOW (ref 13.0–17.0)
Immature Granulocytes: 0 %
Lymphocytes Relative: 24 %
Lymphs Abs: 1.2 10*3/uL (ref 0.7–4.0)
MCH: 37.9 pg — ABNORMAL HIGH (ref 26.0–34.0)
MCHC: 35.3 g/dL (ref 30.0–36.0)
MCV: 107.5 fL — ABNORMAL HIGH (ref 80.0–100.0)
Monocytes Absolute: 0.6 10*3/uL (ref 0.1–1.0)
Monocytes Relative: 11 %
Neutro Abs: 3.2 10*3/uL (ref 1.7–7.7)
Neutrophils Relative %: 64 %
Platelets: 170 10*3/uL (ref 150–400)
RBC: 2.53 MIL/uL — ABNORMAL LOW (ref 4.22–5.81)
RDW: 12.4 % (ref 11.5–15.5)
WBC: 5.1 10*3/uL (ref 4.0–10.5)
nRBC: 0 % (ref 0.0–0.2)

## 2022-08-28 LAB — VITAMIN B12: Vitamin B-12: 293 pg/mL (ref 180–914)

## 2022-08-28 LAB — GLUCOSE, CAPILLARY
Glucose-Capillary: 90 mg/dL (ref 70–99)
Glucose-Capillary: 138 mg/dL — ABNORMAL HIGH (ref 70–99)
Glucose-Capillary: 178 mg/dL — ABNORMAL HIGH (ref 70–99)

## 2022-08-28 MED ORDER — ORAL CARE MOUTH RINSE: 15.0000 mL | OROMUCOSAL | Status: DC | PRN

## 2022-08-28 MED ORDER — POLYETHYLENE GLYCOL 3350 17 G PO PACK
17.0000 g | PACK | Freq: Once | ORAL | Status: AC
  Administered 2022-08-28: 17 g via ORAL
  Filled 2022-08-28: qty 1

## 2022-08-28 MED ORDER — POLYETHYLENE GLYCOL 3350 17 G PO PACK: 17.0000 g | PACK | Freq: Every day | ORAL | Status: DC

## 2022-08-28 MED ORDER — ACETAMINOPHEN 325 MG PO TABS
650.0000 mg | ORAL_TABLET | Freq: Four times a day (QID) | ORAL | Status: DC | PRN
  Administered 2022-08-28 – 2022-09-02 (×12): 650 mg via ORAL
  Filled 2022-08-28 (×13): qty 2

## 2022-08-28 MED ORDER — ACETAMINOPHEN 325 MG PO TABS: 650.0000 mg | ORAL_TABLET | Freq: Once | ORAL | Status: DC

## 2022-08-28 NOTE — Progress Notes (Signed)
Occupational Therapy Treatment Patient Details Name: John Fisher MRN: [id number] DOB: 1956/08/09 Today's Date: 08/28/2022   History of present illness Pt is a 66 yr old male who presented due to multiple syncope episodes and has had decrease in PO intake due to dental work. PMH: HTN, T2DM, MDD, alcohol use disorder and THC use.   OT comments  Pt received in recliner from direct handoff from PT. Pt reporting fatigue but agreeable to UB therex. Pt completes red level 2 theraband: diagonals, rows, horizontal abduction, and overhead press 1x10 with BUE with instructional cues. Pt completes stand pivot transfer back to bed with RW and MIN guard A. Exited session with pt seated in bed, exit alarm on and call light in reach    Recommendations for follow up therapy are one component of a multi-disciplinary discharge planning process, led by the attending physician.  Recommendations may be updated based on patient status, additional functional criteria and insurance authorization.    Follow Up Recommendations  Skilled nursing-short term rehab (<3 hours/day)     Assistance Recommended at Discharge Intermittent Supervision/Assistance  Patient can return home with the following  A little help with walking and/or transfers;A little help with bathing/dressing/bathroom;Assistance with cooking/housework;Assist for transportation   Equipment Recommendations  Other (comment)    Recommendations for Other Services      Precautions / Restrictions Precautions Precautions: Fall Precaution Comments: frequent falls at home Restrictions Weight Bearing Restrictions: No       Mobility Bed Mobility   Supervision sit>supine  Transfers   Equipment used: Rolling walker (2 wheels), None Transfers: Sit to/from Stand Sit to Stand: Min guard           General transfer comment: Min guard for safety.cuing for technique.     Balance Overall balance assessment: Needs assistance Sitting-balance  support: Feet supported Sitting balance-Leahy Scale: Good     Standing balance support: During functional activity, No upper extremity supported Standing balance-Leahy Scale: Fair Standing balance comment: Stands for BP check no UE support. Anxious          ADL either performed or assessed with clinical judgement   ADL Overall ADL's : Needs assistance/impaired Eating/Feeding: Independent;Sitting Eating/Feeding Details (indicate cue type and reason): on modified diet Grooming: Wash/dry hands;Wash/dry face;Set up;Sitting   Upper Body Bathing: Sitting;Modified independent   Lower Body Bathing: Minimal assistance;Moderate assistance;Sit to/from stand   Upper Body Dressing : Modified independent;Sitting   Lower Body Dressing: Minimal assistance;Moderate assistance;Sit to/from stand   Toilet Transfer: Min guard;Cueing for safety;Cueing for sequencing;Stand-pivot;Rolling walker (2 wheels);BSC/3in1   Toileting- Clothing Manipulation and Hygiene: Minimal assistance;Cueing for safety;Cueing for sequencing;Sit to/from stand       Functional mobility during ADLs: Min guard;Cueing for safety;Cueing for sequencing;Rolling walker (2 wheels)      Extremity/Trunk Assessment Upper Extremity Assessment Upper Extremity Assessment: Generalized weakness   Lower Extremity Assessment Lower Extremity Assessment: Generalized weakness   Cervical / Trunk Assessment Cervical / Trunk Assessment: Kyphotic    Vision Baseline Vision/History: 1 Wears glasses Ability to See in Adequate Light: 0 Adequate Patient Visual Report: No change from baseline Vision Assessment?: No apparent visual deficits   Perception Perception Perception: Within Functional Limits   Praxis Praxis Praxis: Intact    Cognition Arousal/Alertness: Awake/alert Behavior During Therapy: Anxious               General Comments See orthostatic under vitals tab. (end of session BP 138/102 HR 113. HR did elevated to 147  during tx at one point,  decreases upon sitting.    Pertinent Vitals/ Pain       Pain Assessment Pain Assessment: No/denies pain  Home Living Family/patient expects to be discharged to:: Private residence Living Arrangements: Alone   Type of Home: House Home Access: Other (comment)     Home Layout: One level     Bathroom Shower/Tub: Tub/shower unit;Curtain   Firefighter: Standard Bathroom Accessibility: Yes   Home Equipment: Cane - single point              Frequency  Min 2X/week        Progress Toward Goals  OT Goals(current goals can now be found in the care plan section)               AM-PAC OT "6 Clicks" Daily Activity     Outcome Measure   Help from another person eating meals?: None Help from another person taking care of personal grooming?: A Little Help from another person toileting, which includes using toliet, bedpan, or urinal?: A Little Help from another person bathing (including washing, rinsing, drying)?: A Lot Help from another person to put on and taking off regular upper body clothing?: A Little Help from another person to put on and taking off regular lower body clothing?: A Lot 6 Click Score: 17    End of Session Equipment Utilized During Treatment: Gait belt;Rolling walker (2 wheels)  OT Visit Diagnosis: Unsteadiness on feet (R26.81);Other abnormalities of gait and mobility (R26.89);Repeated falls (R29.6);Muscle weakness (generalized) (M62.81);History of falling (Z91.81);Pain Pain - part of body: Knee   Activity Tolerance Patient limited by fatigue   Patient Left in bed;with call bell/phone within reach;with bed alarm set   Nurse Communication Mobility status        Time: 9604-5409 OT Time Calculation (min): 19 min  Charges: OT General Charges $OT Visit: 1 Visit OT Treatments $Therapeutic Exercise: 8-22 mins  [facility] MOTR/L   Shon Hale 08/28/2022, 3:24 PM

## 2022-08-28 NOTE — Progress Notes (Signed)
     Daily Progress Note Intern Pager: [phone]  Patient name: John Fisher Medical record number: [id number] Date of birth: 1956-02-29 Age: 66 y.o. Gender: male  Primary Care Provider: [facility] Consultants: Dentistry Code Status: Full code    Pt Overview and Major Events to Date:  12/11: Admitted to FMTS service   Assessment and Plan: John Fisher is a 66 y.o. male presenting with multiple episodes of syncope in the setting of poor PO intake due to dental pain and depression, macrocytic anemia and dehydration with concomitant alcohol use disorder. PMHx includes HTN, T2DM, MDD, alcohol use disorder and THC use.   * Syncope Persistent orthostasis with difficulty eating due to pain. Per dentistry, no concern for dental infections or further inpatient workup at this time. -D/c MIVF with goal to increase oral intake -Nutrition consulted, continue dietary supplements -SLP consulted, pending reassessment to advance patient diet while inpatient -Trial Tylenol prior to meals -PT/OT recommending acute rehab -Tylenol 650mg  q6h prn -CBG q12h  Macrocytic anemia Hgb 9.6, stable. Likely in the setting of nutritional deficiency. On folate supplementation. B12 normal.   Chronic conditions: HLD: continue Lipitor 40mg  HTN: hold home amlodipine (normotensive) DM: discontinue Metformin 500mg  BPH: restart Flomax   FEN/GI: Dysphagia 1 diet (soft), thin liquids VTE Prophylaxis: Lovenox Dispo: SNF pending further dental recommendations  Subjective:  Patient assessed at bedside, resting comfortably. States the dental pain does limit his eating and he has to chew for quite some time and then force himself to swallow. States a doctor told him 10 years ago not to take Tylenol for unknown reason, willing to try it before meals to help with the pain. Requested to advance diet as tolerated as the dysphagia diet is not pleasant to eat. Continue to feel lightheaded upon standing. States he  woke up with a HA, given Tylenol.  Objective: Temp:  [97.8 F (36.6 C)-98.7 F (37.1 C)] 98.4 F (36.9 C) (12/15 1210) Pulse Rate:  [68-76] 73 (12/15 1210) Resp:  [16-17] 16 (12/15 0513) BP: (124-146)/(93-111) 131/111 (12/15 1210) SpO2:  [100 %] 100 % (12/15 1503) Physical Exam: General: Sitting up in bed. Well appearing HEENT: Very poor dentition with many missing teeth Cardiovascular: RRR without murmur Respiratory: CTAB. Normal effort on RA Abdomen: Soft, non-tender, non-distended. +BS Extremities: Trace LE edema bilaterally.  Laboratory: Most recent CBC Lab Results  Component Value Date   WBC 5.1 08/28/2022   HGB 9.6 (L) 08/28/2022   HCT 27.2 (L) 08/28/2022   MCV 107.5 (H) 08/28/2022   PLT 170 08/28/2022   Most recent BMP    Latest Ref Rng & Units 08/25/2022    2:53 AM  BMP  Glucose 70 - 99 mg/dL 98   BUN 8 - 23 mg/dL 11   Creatinine 08/30/2022 - 1.24 mg/dL 08/30/2022   Sodium 14/08/2022 - 3.14 mmol/L 142   Potassium 3.5 - 5.1 mmol/L 4.1   Chloride 98 - 111 mmol/L 109   CO2 22 - 32 mmol/L 26   Calcium 8.9 - 10.3 mg/dL 8.5     Other pertinent labs: B12: 293 AST: 36 ALT: 27   9.70, MD 08/28/2022, 3:24 PM  PGY-1, [facility] Family Medicine FPTS Intern pager: [phone], text pages welcome Secure chat group [facility] Teaching Service

## 2022-08-28 NOTE — TOC Progression Note (Signed)
Transition of Care [facility]) - Progression Note    Patient Details  Name: John Fisher MRN: [id number] Date of Birth: 1955-12-21  Transition of Care [facility]) CM/SW Contact  Delilah Shan, LCSWA Phone Number: 08/28/2022, 4:57 PM  Clinical Narrative:     Patient has SNF bed at [facility] on Monday if medically ready for dc. TOC will continue to follow and assist with patients dc planning needs.  Expected Discharge Plan: Skilled Nursing Facility Barriers to Discharge: Continued Medical Work up, SNF Pending bed offer  Expected Discharge Plan and Services Expected Discharge Plan: Skilled Nursing Facility In-house Referral: Clinical Social Work   Post Acute Care Choice: Skilled Nursing Facility Living arrangements for the past 2 months: Apartment                                       Social Determinants of Health (SDOH) Interventions    Readmission Risk Interventions     No data to display

## 2022-08-28 NOTE — Progress Notes (Signed)
Physical Therapy Treatment Patient Details Name: REAGAN KLEMZ MRN: [id number] DOB: March 12, 1956 Today's Date: 08/28/2022   History of Present Illness Pt is a 66 yr old male who presented due to multiple syncope episodes and has had decrease in PO intake due to dental work. PMH: HTN, T2DM, MDD, alcohol use disorder and THC use.    PT Comments    Fair tolerance with treatment today. Still complaining of subjective dizziness with mobility. Able to ambulate into hallway today, anxious but with min guard for safety using RW for support, and education for use. HR up to 147 at one point while working with therapy. Symptoms improve while sitting. BP with mild fluctuations (see vitals tab.) Patient will continue to benefit from skilled physical therapy services to further improve independence with functional mobility.    Recommendations for follow up therapy are one component of a multi-disciplinary discharge planning process, led by the attending physician.  Recommendations may be updated based on patient status, additional functional criteria and insurance authorization.  Follow Up Recommendations  Skilled nursing-short term rehab (<3 hours/day) Can patient physically be transported by private vehicle: No   Assistance Recommended at Discharge Frequent or constant Supervision/Assistance  Patient can return home with the following A little help with walking and/or transfers;A little help with bathing/dressing/bathroom;Assistance with cooking/housework;Direct supervision/assist for medications management;Direct supervision/assist for financial management;Assist for transportation;Help with stairs or ramp for entrance   Equipment Recommendations  Rolling walker (2 wheels)    Recommendations for Other Services       Precautions / Restrictions Precautions Precautions: Fall Precaution Comments: frequent falls at home Restrictions Weight Bearing Restrictions: No     Mobility  Bed  Mobility Overal bed mobility: Modified Independent             General bed mobility comments: Mod I to sit EOB, takes extra time.    Transfers Overall transfer level: Needs assistance Equipment used: Rolling walker (2 wheels), None Transfers: Sit to/from Stand Sit to Stand: Min guard           General transfer comment: Min guard for safety. Reaching for railing, feels unsteady. Improved with use of RW for support. Cues for technique.    Ambulation/Gait Ambulation/Gait assistance: Min guard Gait Distance (Feet): 60 Feet (+15x2) Assistive device: Rolling walker (2 wheels) Gait Pattern/deviations: Step-through pattern, Decreased stride length, Trunk flexed Gait velocity: reduced Gait velocity interpretation: <1.8 ft/sec, indicate of risk for recurrent falls   General Gait Details: No buckling noted. Wide BOS. Complains of dizziness. Completed 2 bouts to and from restroom. 3rd bout into hallway with min guard for safety, cues for walker placement for proximity.   Stairs             Wheelchair Mobility    Modified Rankin (Stroke Patients Only)       Balance Overall balance assessment: Needs assistance Sitting-balance support: Feet supported Sitting balance-Leahy Scale: Good     Standing balance support: During functional activity, No upper extremity supported Standing balance-Leahy Scale: Fair Standing balance comment: Stands for BP check no UE support. Anxious                            Cognition Arousal/Alertness: Awake/alert Behavior During Therapy: Anxious Overall Cognitive Status: No family/caregiver present to determine baseline cognitive functioning  Exercises      General Comments General comments (skin integrity, edema, etc.): See orthostatic under vitals tab. (end of session BP 138/102 HR 113. HR did elevated to 147 during tx at one point, decreases upon sitting.       Pertinent Vitals/Pain Pain Assessment Pain Assessment: No/denies pain Pain Intervention(s): Monitored during session    Home Living                          Prior Function            PT Goals (current goals can now be found in the care plan section) Acute Rehab PT Goals Patient Stated Goal: to walk safely and go home PT Goal Formulation: With patient Time For Goal Achievement: 09/01/22 Potential to Achieve Goals: Good Progress towards PT goals: Progressing toward goals    Frequency    Min 3X/week      PT Plan Current plan remains appropriate    Co-evaluation              AM-PAC PT "6 Clicks" Mobility   Outcome Measure  Help needed turning from your back to your side while in a flat bed without using bedrails?: None Help needed moving from lying on your back to sitting on the side of a flat bed without using bedrails?: None Help needed moving to and from a bed to a chair (including a wheelchair)?: A Little Help needed standing up from a chair using your arms (e.g., wheelchair or bedside chair)?: A Little Help needed to walk in hospital room?: A Little Help needed climbing 3-5 steps with a railing? : A Lot 6 Click Score: 19    End of Session Equipment Utilized During Treatment: Gait belt Activity Tolerance: Patient limited by fatigue (subjective dizziness) Patient left: with call bell/phone within reach;in chair;Other (comment) (OT into room) Nurse Communication: Mobility status PT Visit Diagnosis: Unsteadiness on feet (R26.81);Muscle weakness (generalized) (M62.81);Repeated falls (R29.6)     Time: 1410-1435 PT Time Calculation (min) (ACUTE ONLY): 25 min  Charges:  $Gait Training: 8-22 mins $Therapeutic Activity: 8-22 mins                     Kathlyn Sacramento, PT, DPT Physical Therapist Acute Rehabilitation Services [facility]    Berton Mount 08/28/2022, 3:04 PM

## 2022-08-28 NOTE — Care Management Important Message (Signed)
Important Message  Patient Details  Name: TYJUAN DEMETRO MRN: [id number] Date of Birth: [birth year]   Medicare Important Message Given:  Yes     Sherilyn Banker 08/28/2022, 12:30 PM

## 2022-08-29 DIAGNOSIS — I951 Orthostatic hypotension: Secondary | ICD-10-CM | POA: Diagnosis not present

## 2022-08-29 LAB — GLUCOSE, CAPILLARY
Glucose-Capillary: 102 mg/dL — ABNORMAL HIGH (ref 70–99)
Glucose-Capillary: 161 mg/dL — ABNORMAL HIGH (ref 70–99)

## 2022-08-29 NOTE — Progress Notes (Addendum)
Pt stated that he hasn't been on 2 of his diabetic meds since he has been in the hospital. Family Medicine Teaching service was paged three time @1008 , 1556 & 1557 on [phone] from unit phone [phone] and also amion.  MD called back was informed that they are aware of home med because of upcoming surgery and CBG monitoring. They will not resume meds at this time. So I educated pt about the MD position and he stated " I understands better why the doctor is holding them for now, and I'm ok with their decision."

## 2022-08-29 NOTE — Progress Notes (Signed)
Mobility Specialist Progress Note    08/29/22 1726  Mobility  Activity Ambulated with assistance in hallway  Level of Assistance Contact guard assist, steadying assist  Assistive Device Front wheel walker  Distance Ambulated (ft) 160 ft  Activity Response Tolerated well  Mobility Referral Yes  $Mobility charge 1 Mobility   Post-Mobility: 142/97 (110) BP  Pt received in bed and agreeable. C/o dizziness but stated it had resolved during activity. Also c/o headache. Returned to chair with call bell in reach.    [place] Mobility Specialist  Please Neurosurgeon or Rehab Office at [phone]

## 2022-08-29 NOTE — Progress Notes (Signed)
     Daily Progress Note Intern Pager: [phone]  Patient name: ALDRIDGE KRZYZANOWSKI Medical record number: [id number] Date of birth: Jan 03, 1956 Age: 66 y.o. Gender: male  [facility] [place] [facility] Consultants: Dentistry Code Status: Full Code  Pt Overview and Major Events to Date:  12/11: Admitted to FMTS service   Assessment and Plan:  LAQUENTIN LOUDERMILK is a 66 y.o. male admitted for syncope in the setting of poor PO intake due to dental pain and depression, macrocytic anemia and dehydration with concomitant alcohol use disorder. PMHx includes HTN, T2DM, MDD, alcohol use disorder and THC use.    * Syncope Persistent orthostasis with difficulty eating due to pain. Per dentistry, no concern for dental infections or further inpatient workup at this time. CBG 120-178. -Nutrition consulted, continue dietary supplements -SLP consulted, pending reassessment to advance patient diet while inpatient -Tylenol prior to meals -PT/OT recommendingSNF -Tylenol 650mg  q6h prn -CBG q12h  Macrocytic anemia Hgb 9.6, stable. Likely in the setting of nutritional deficiency. On folate supplementation. B12 normal.   Chronic Stable Conditions: HLD: continue Lipitor 40mg  HTN: hold home amlodipine (normotensive) DM: discontinue Metformin 500mg  BPH: continue Flomax  FEN/GI: Dysphagia 1  PPx: Lovenox Dispo: SNF, medically stable  Subjective:  Patient states he is doing well.  However has a headache over the past 24 hours.  No chest pains, shortness of breath.  States that he is doing well with eating.   Objective: Temp:  [98.4 F (36.9 C)-98.7 F (37.1 C)] 98.4 F (36.9 C) (12/16 0700) Pulse Rate:  [68-77] 77 (12/16 0700) Resp:  [16-18] 16 (12/16 0700) BP: (125-134)/(81-111) 129/88 (12/16 0700) SpO2:  [100 %] 100 % (12/16 0700) Physical Exam: General: NAD  Cardiovascular: RRR, no murmurs, no peripheral edema Respiratory: normal WOB on RA, CTAB, no wheezes, ronchi or  rales Abdomen: soft, NTTP, no rebound or guarding Extremities: Moving all 4 extremities equally   Laboratory: Most recent CBC Lab Results  Component Value Date   WBC 5.1 08/28/2022   HGB 9.6 (L) 08/28/2022   HCT 27.2 (L) 08/28/2022   MCV 107.5 (H) 08/28/2022   PLT 170 08/28/2022   Most recent BMP    Latest Ref Rng & Units 08/25/2022    2:53 AM  BMP  Glucose 70 - 99 mg/dL 98   BUN 8 - 23 mg/dL 11   Creatinine 08/30/2022 - 1.24 mg/dL 08/30/2022   Sodium 14/08/2022 - 6.75 mmol/L 142   Potassium 3.5 - 5.1 mmol/L 4.1   Chloride 98 - 111 mmol/L 109   CO2 22 - 32 mmol/L 26   Calcium 8.9 - 10.3 mg/dL 8.5     Other pertinent labs: none   Imaging/Diagnostic Tests: None  9.16, MD 08/29/2022, 7:41 AM  PGY-1, [place] Family Medicine FPTS Intern pager: [phone], text pages welcome Secure chat group [facility] Teaching Service

## 2022-08-30 DIAGNOSIS — I951 Orthostatic hypotension: Secondary | ICD-10-CM | POA: Diagnosis not present

## 2022-08-30 NOTE — Progress Notes (Signed)
Mobility Specialist Progress Note   08/30/22 1127  Mobility  Activity Ambulated with assistance in hallway  Level of Assistance Contact guard assist, steadying assist  Walker wheel walker  Distance Ambulated (ft) 160 ft  Range of Motion/Exercises Active;All extremities  Activity Response Tolerated well   Patient received in supine and eager to participate. Was independent for bed mobility, stood and ambulated with supervision to min guard for safety. Returned to room without complaint of dizziness or any incident of instability. Was left in supine with all needs met, call bell in reach.   John Fisher, BS EXP Mobility Specialist Please contact via SecureChat or Rehab office at [phone]

## 2022-08-30 NOTE — Progress Notes (Addendum)
     Daily Progress Note Intern Pager: [phone]  Patient name: John Fisher Medical record number: [id number] Date of birth: 1956-08-04 Age: 66 y.o. Gender: male  Primary Care Provider: [facility] Consultants: Dentistry  Code Status: Full code  Pt Overview and Major Events to Date:  12/11: Admitted to FMTS service   Assessment and Plan: John Fisher is a 66 y.o. male admitted for syncope in the setting of poor PO intake due to dental pain and depression, macrocytic anemia and dehydration with concomitant alcohol use disorder. PMHx includes HTN, T2DM, MDD, alcohol use disorder and THC use. Medically stable for SNF  * Syncope In the setting of unintentional weight loss, progressive dysphagia. Some dizziness on mobility 12/16 that resolved during activity. Difficulty eating due to pain. Per dentistry, no concern for dental infections or further inpatient workup at this time. CBG 120-178. -Nutrition consulted, continue dietary supplements -SLP consulted, pending reassessment to advance patient diet while inpatient -Tylenol prior to meals -PT/OT recommending SNF -Tylenol 650mg  q6h prn -CBG q12h  Macrocytic anemia Hgb 9.6, stable on 12/15. Likely in the setting of nutritional deficiency. On folate supplementation. B12 normal.  -Defer labs given stability.   FEN/GI: Dysphagia 1 PPx: lovenox Dispo: SNF likely 12/18, medically stable  Subjective:  Patient complained of frontal headache this morning.  He says he has been having it for the past 2 days.  He has been taking Tylenol as needed. This is helping. Frequently has headaches at home. No change since admission. Believe related to pillows.   Objective: Temp:  [98.1 F (36.7 C)-98.7 F (37.1 C)] 98.7 F (37.1 C) (12/16 2053) Pulse Rate:  [74-85] 74 (12/16 2053) Resp:  [14-16] 16 (12/16 2053) BP: (128-132)/(72-93) 132/87 (12/16 2053) SpO2:  [99 %-100 %] 99 % (12/16 2053) Physical Exam: General: NAD, laying in  bed comfortably, alert and responsive to questions Cardiovascular: Regular rate and rhythm, no murmurs rubs or gallops Respiratory: Clear to auscultation bilaterally, no wheezes rales or crackles Abdomen: Soft, nontender to palpation Extremities: No lower extremity edema, typical socks in place   Laboratory: Most recent CBC Lab Results  Component Value Date   WBC 5.1 08/28/2022   HGB 9.6 (L) 08/28/2022   HCT 27.2 (L) 08/28/2022   MCV 107.5 (H) 08/28/2022   PLT 170 08/28/2022   Most recent BMP    Latest Ref Rng & Units 08/25/2022    2:53 AM  BMP  Glucose 70 - 99 mg/dL 98   BUN 8 - 23 mg/dL 11   Creatinine 14/08/2022 - 1.24 mg/dL 9.02   Sodium 4.09 - 735 mmol/L 142   Potassium 3.5 - 5.1 mmol/L 4.1   Chloride 98 - 111 mmol/L 109   CO2 22 - 32 mmol/L 26   Calcium 8.9 - 10.3 mg/dL 8.5    329, MD 08/30/2022, 6:19 AM  PGY-2, [facility] FPTS Intern pager: [phone], text pages welcome Secure chat group [facility] Teaching Service

## 2022-08-30 NOTE — Discharge Instructions (Signed)
Dear John Fisher,   Thank you for letting us participate in your care! In this section, you will find a brief hospital admission summary of why you were admitted to the hospital, what happened during your admission, your diagnosis/diagnoses, and recommended follow up.  Primary diagnosis: You were admitted for episodes of dizziness and lightheadedness upon standing. This was likely due to a dehydration and lack of nutrition due to your difficulties eating. Treatment plan: You were seen by dentistry that did not find any active infection. The speech therapist saw you and noted problem with your swallowing and want to continue seeing you outpatient for these issues. You were also seen by the nutritionist that started ou on nutritional supplements.   POST-HOSPITAL & CARE INSTRUCTIONS We recommend following up with your PCP within 1 week from being discharged from the hospital. Please let PCP/Specialists know of any changes in medications that were made which you will be able to see in the medications section of this packet. Please follow up with your dentist at the [facility] to manage your dental care. Please also follow up with the speech pathologist to continue evaluating your swallowing function  DOCTOR'S APPOINTMENTS & FOLLOW UP No future appointments.   Thank you for choosing [facility]! Take care and be well!  Family Medicine Teaching Service Inpatient Team [place]  [facility]  [address] [place], [place] [address] [phone]

## 2022-08-31 DIAGNOSIS — I951 Orthostatic hypotension: Secondary | ICD-10-CM | POA: Diagnosis not present

## 2022-08-31 MED ORDER — METFORMIN HCL ER 500 MG PO TB24: 500.0000 mg | ORAL_TABLET | Freq: Every day | ORAL | 0 refills | Status: DC

## 2022-08-31 MED ORDER — FOLIC ACID 1 MG PO TABS: 1.0000 mg | ORAL_TABLET | Freq: Every day | ORAL | 0 refills | Status: DC

## 2022-08-31 NOTE — Plan of Care (Incomplete)
Pt A&OX4, BP 126/88   Pulse 78   Temp 98.2 F (36.8 C) (Oral)   Resp 18   Ht 6' (1.829 m)   Wt 77.1 kg   SpO2 100%   BMI 23.06 kg/m  Med rec and AVS reviewed no questions. No IV, tele. Pt belogings returened with no further questions. Pt to be transported to [facility] via Ambulance. Reva Bores 1:35 PM  08/31/22

## 2022-08-31 NOTE — Discharge Summary (Addendum)
[facility] Discharge Summary  Patient name: John Fisher Medical record number: [id number] Date of birth: June 03, 1956 Age: 66 y.o. Gender: male Date of Admission: 08/24/2022  Date of Discharge: 08/31/2022 Admitting Physician: Colletta Maryland, MD  Primary Care Provider: [facility] Consultants: Dentistry  Indication for Hospitalization: Syncope  Discharge Diagnoses/Problem List:  Principal Problem for Admission: Syncope Other Problems addressed during stay:  Principal Problem:   Syncope Active Problems:   Macrocytic anemia   Dysphagia   Malnutrition of moderate degree   Poor dentition   Weakness   Orthostatic syncope   Encounter for dental examination   Caries   Chronic apical periodontitis   Accretions on teeth   Teeth missing  Brief Hospital Course:  John Fisher is a 66 y.o.male with a history of HTN, T2DM, MDD, alcohol use disorder and THC use who was admitted to the [facility] Medicine Teaching Service at [facility] for syncope. His hospital course is detailed below:  Syncope Presents with four episodes of syncope occurring over a two week period associated with dental pain and weight loss. Likely multifactorial in nature, stemming from prolonged poor PO intake due to dental pain and swallowing difficulties. Orthostasis due to dehydration in the setting chronic alcohol use disorder and low fluid intake. Inpatient dentistry consulted, no concern for infection or need for inpatient workup. SLP consulted, patient had FEES showing severe oral dysphagia likely secondary to dental issues. Dentistry was consulted and recommended no intervention in the inpatient setting.  Recommended outpatient f/u with resolving dental concern. Macrocytic anemia and muscle deconditioning likely due to nutritional deficiency. Nutrition consulted, and patient started on oral supplementation. Diet was advanced to Dysphagia 3 diet Discharged to acute rehab per  PT/OT.  Other chronic conditions were medically managed with home medications and formulary alternatives as necessary (HLD, HTN)  PCP Follow-up Recommendations:  Alcohol use disorder cessation Outpatient colonoscopy given anemia, weight loss and lack of recent screening in the chart Check CBGs to possibly restart diabetes medications.  Disposition: SNF - [facility]  Discharge Condition: Stable  Discharge Exam:  Vitals:   08/30/22 2100 08/31/22 0811  BP: 118/74 (!) 132/103  Pulse: 68 70  Resp: 16 18  Temp: 98.3 F (36.8 C) 98.2 F (36.8 C)  SpO2: 98% 100%   General: Well-appearing. Alert. NAD CV: RRR without murmur Pulm: CTAB. Normal WOB on RA. No wheezing Abdomen: Soft, non-tender, non-distended. +BS Ext: Well perfused. No peripheral edema  Significant Procedures: None  Significant Labs and Imaging:  No results for input(s): "WBC", "HGB", "HCT", "PLT" in the last 48 hours. No results for input(s): "NA", "K", "CL", "CO2", "GLUCOSE", "BUN", "CREATININE", "CALCIUM", "MG", "PHOS", "ALKPHOS", "AST", "ALT", "ALBUMIN", "PROTEIN" in the last 48 hours.  Pertinent Imaging: DG Orthopantogram Result Date: 08/26/2022 IMPRESSION: 1. Extremely poor dentition, with large erosion of the left inferior premolar as well as lucency surrounding the root.   ECHOCARDIOGRAM COMPLETE Result Date: 08/24/2022 IMPRESSIONS  1. Left ventricular ejection fraction, by estimation, is 60 to 65%. The left ventricle has normal function. The left ventricle has no regional wall motion abnormalities. Left ventricular diastolic parameters were normal.  2. Right ventricular systolic function is normal. The right ventricular size is normal.  3. The mitral valve is normal in structure. Trivial mitral valve regurgitation. No evidence of mitral stenosis.  4. The aortic valve is normal in structure. Aortic valve regurgitation is trivial. No aortic stenosis is present.  5. The inferior vena cava is normal in size with  greater than 50% respiratory variability, suggesting right atrial pressure of 3 mmHg.  DG Chest Portable 1 View Result Date: 08/24/2022 IMPRESSION: No active disease.  CT Head Wo Contrast Result Date: 08/24/2022 IMPRESSION: No evidence of intracranial injury.  Results/Tests Pending at Time of Discharge: None  Discharge Medications:  Allergies as of 08/31/2022       Reactions   Trospium    Other Reaction(s): Retention of urine   Alfuzosin Anxiety        Medication List     STOP taking these medications    amLODipine 10 MG tablet Commonly known as: NORVASC   Cyanocobalamin 1000 MCG Caps   EPINEPHrine 0.3 mg/0.3 mL Soaj injection Commonly known as: EPI-PEN   montelukast 10 MG tablet Commonly known as: Singulair   Vitamin D (Ergocalciferol) 1.25 MG (50000 UNIT) Caps capsule Commonly known as: DRISDOL       TAKE these medications    aspirin EC 81 MG tablet Take 81 mg by mouth daily.   atorvastatin 20 MG tablet Commonly known as: LIPITOR Take 10 mg by mouth daily.   fluticasone 50 MCG/ACT nasal spray Commonly known as: FLONASE Place 1 spray into both nostrils 2 (two) times daily as needed for allergies or rhinitis.   folic acid 1 MG tablet Commonly known as: FOLVITE Take 1 tablet (1 mg total) by mouth daily.   metFORMIN 500 MG 24 hr tablet Commonly known as: GLUCOPHAGE-XR Take 1 tablet (500 mg total) by mouth daily with breakfast. What changed: how much to take   naloxone 4 MG/0.1ML Liqd nasal spray kit Commonly known as: NARCAN Place 1 spray into the nose once.   tamsulosin 0.4 MG Caps capsule Commonly known as: FLOMAX Take 0.4 mg by mouth.        Discharge Instructions: Please refer to Patient Instructions section of EMR for full details.  Patient was counseled important signs and symptoms that should prompt return to medical care, changes in medications, dietary instructions, activity restrictions, and follow up appointments.   Follow-Up  Appointments: Outpatient [facility] f/u   Colletta Maryland, MD 08/31/2022, 11:40 AM PGY-1, [place]

## 2022-08-31 NOTE — TOC Progression Note (Addendum)
Transition of Care [facility]) - Progression Note    Patient Details  Name: John Fisher MRN: [id number] Date of Birth: 08/10/1956  Transition of Care [facility]) CM/SW Contact  Erin Sons, [place] Phone Number: 08/31/2022, 3:28 PM  Clinical Narrative:     Plan was for pt to go to [facility] today under his medicare part A benefits. CSW had confirmed with SNF that pt could admit today. RN called report and CSW had scheduled [facility] transport.   CSW received call from [facility] at 15:05 and is informed that their business office cannot take pt under his medicare part A due to the hospital using pt's VA coverage for his current hospital admission. This means he would not have the 3 midnight qualifying stay under his medicare that is required for Medicare to cover SNF. [facility] not take [place] SN[facility] coverage. CSW notified RN and attending team. Will update pt shortly. A new SNF bed will need to be secured and insurance auth to be approved through the [place]. TOC leadership made aware  1600: CSW updated pt. VA SNF request faxed to [facility].    Expected Discharge Plan: Skilled Nursing Facility Barriers to Discharge: Insurance Authorization, No SNF bed  Expected Discharge Plan and Services Expected Discharge Plan: Skilled Nursing Facility In-house Referral: Clinical Social Work   Post Acute Care Choice: Skilled Nursing Facility Living arrangements for the past 2 months: Apartment Expected Discharge Date: 08/31/22                                     Social Determinants of Health (SDOH) Interventions    Readmission Risk Interventions     No data to display

## 2022-08-31 NOTE — Progress Notes (Signed)
Physical Therapy Treatment Patient Details Name: John Fisher MRN: [id number] DOB: [birth year] Today's Date: 08/31/2022   History of Present Illness Pt is a 66 yr old male who presented due to multiple syncope episodes and has had decrease in PO intake due to dental work. PMH: HTN, T2DM, MDD, alcohol use disorder and THC use.    PT Comments    Pt received in bed, reports HA as well as generally not feeling well today. Focused on bed and sitting exercises that he can do on days like this when he feels anxious about ambulating and falling. Performed supine LE and core strengthening exercises. Completed seated there ex EOB including sit>stand from various heights of the bed. Pt ambulated 20' with RW in room. PT will continue to follow.    Recommendations for follow up therapy are one component of a multi-disciplinary discharge planning process, led by the attending physician.  Recommendations may be updated based on patient status, additional functional criteria and insurance authorization.  Follow Up Recommendations  Skilled nursing-short term rehab (<3 hours/day) Can patient physically be transported by private vehicle: Yes   Assistance Recommended at Discharge Frequent or constant Supervision/Assistance  Patient can return home with the following A little help with walking and/or transfers;A little help with bathing/dressing/bathroom;Assistance with cooking/housework;Direct supervision/assist for medications management;Direct supervision/assist for financial management;Assist for transportation;Help with stairs or ramp for entrance   Equipment Recommendations  Rolling walker (2 wheels)    Recommendations for Other Services       Precautions / Restrictions Precautions Precautions: Fall Precaution Comments: frequent falls at home Restrictions Weight Bearing Restrictions: No     Mobility  Bed Mobility Overal bed mobility: Modified Independent Bed Mobility: Supine to Sit      Supine to sit: Modified independent (Device/Increase time)          Transfers Overall transfer level: Needs assistance Equipment used: Rolling walker (2 wheels) Transfers: Sit to/from Stand Sit to Stand: Min guard           General transfer comment: min guard for safety    Ambulation/Gait Ambulation/Gait assistance: Min guard Gait Distance (Feet): 20 Feet Assistive device: Rolling walker (2 wheels) Gait Pattern/deviations: Step-through pattern, Decreased stride length, Trunk flexed Gait velocity: reduced Gait velocity interpretation: <1.31 ft/sec, indicative of household ambulator   General Gait Details: no buckling of knees, increased effort needed to ambulate and pt reports fatigue   Stairs             Wheelchair Mobility    Modified Rankin (Stroke Patients Only)       Balance Overall balance assessment: Needs assistance Sitting-balance support: Feet supported Sitting balance-Leahy Scale: Good     Standing balance support: During functional activity, No upper extremity supported Standing balance-Leahy Scale: Fair                              Cognition Arousal/Alertness: Awake/alert Behavior During Therapy: WFL for tasks assessed/performed Overall Cognitive Status: Within Functional Limits for tasks assessed                                 General Comments: WFL for basic tasks        Exercises General Exercises - Lower Extremity Ankle Circles/Pumps: AROM, Both, 10 reps, Supine Quad Sets: AROM, Right, 10 reps, Supine Mini-Sqauts: AROM, 10 reps Other Exercises Other Exercises: supine bridging x10 Other Exercises:  supine knee lifts x10 Other Exercises: combo exercise knee ext, hip flex, knee flex/ext, hip ext, 5x each side Other Exercises: supine bicycle knees x10    General Comments General comments (skin integrity, edema, etc.): instructed to stay up in recliner at least 30 mins      Pertinent Vitals/Pain  Pain Assessment Pain Assessment: Faces Faces Pain Scale: Hurts even more Pain Location: knees, head Pain Descriptors / Indicators: Aching, Headache Pain Intervention(s): Limited activity within patient's tolerance, Monitored during session    Home Living                          Prior Function            PT Goals (current goals can now be found in the care plan section) Acute Rehab PT Goals Patient Stated Goal: to walk safely and go home abd be able to get up off the floor PT Goal Formulation: With patient Time For Goal Achievement: 09/01/22 Potential to Achieve Goals: Good Progress towards PT goals: Progressing toward goals    Frequency    Min 3X/week      PT Plan Current plan remains appropriate    Co-evaluation              AM-PAC PT "6 Clicks" Mobility   Outcome Measure  Help needed turning from your back to your side while in a flat bed without using bedrails?: None Help needed moving from lying on your back to sitting on the side of a flat bed without using bedrails?: None Help needed moving to and from a bed to a chair (including a wheelchair)?: A Little Help needed standing up from a chair using your arms (e.g., wheelchair or bedside chair)?: A Little Help needed to walk in hospital room?: A Little Help needed climbing 3-5 steps with a railing? : A Lot 6 Click Score: 19    End of Session Equipment Utilized During Treatment: Gait belt Activity Tolerance: Patient tolerated treatment well Patient left: with call bell/phone within reach;in chair;with chair alarm set Nurse Communication: Mobility status PT Visit Diagnosis: Unsteadiness on feet (R26.81);Muscle weakness (generalized) (M62.81);Repeated falls (R29.6)     Time: 6767-2094 PT Time Calculation (min) (ACUTE ONLY): 27 min  Charges:  $Gait Training: 8-22 mins $Therapeutic Exercise: 8-22 mins                     Lyanne Co, PT  Acute Rehab Services Secure chat  preferred Office [phone]    Lawana Chambers Dakotah Orrego 08/31/2022, 12:01 PM

## 2022-08-31 NOTE — Progress Notes (Signed)
     Daily Progress Note Intern Pager: [phone]  Patient name: John Fisher Medical record number: [id number] Date of birth: November 15, 1955 Age: 66 y.o. Gender: male  Primary Care Provider: [facility] Consultants: None Code Status: Full code  Pt Overview and Major Events to Date:  12/11: Admitted to FMTS service    Assessment and Plan: RUE TINNEL is a 66 y.o. male admitted for syncope in the setting of poor PO intake due to dental pain and depression, macrocytic anemia and dehydration with concomitant alcohol use disorder. PMHx includes HTN, T2DM, MDD, alcohol use disorder and THC use.  * Syncope No syncopal episodes in the hospital. Endorses some persistent orthostasis. Encourage oral hydration and activity as tolerated. BGL stable. -Continue nutritional supplement and soft diet -PT/OT recommending SNF  Macrocytic anemia Stable, likely secondary to nutritional deficiency. -Continue folate supplementation   FEN/GI: Soft diet PPx: Lovenox Dispo: SNF pending placement  Subjective:  Patient assessed at bedside. Expressed excitement at going to SNF today. States he is ready to get stronger and feel well. States the soft diet has been much better as he has been able to eat spaghetti. States he hasn't been drinking as much and feels he should drink more water.  Patient is medically stable for SNF.  Objective: Temp:  [98.1 F (36.7 C)-98.3 F (36.8 C)] 98.1 F (36.7 C) (12/18 1427) Pulse Rate:  [68-78] 69 (12/18 1427) Resp:  [16-18] 17 (12/18 1427) BP: (118-138)/(74-103) 138/98 (12/18 1427) SpO2:  [98 %-100 %] 99 % (12/18 1427) Physical Exam: General: Well-appearing. Alert. NAD HEENT: Poor dentition missing multiple teeth Cardiovascular: RRR, no murmur Respiratory: CTAB. Normal WOB on RA. No wheezing Abdomen: Soft, non-tender, non-distended Extremities: No peripheral edema.  Laboratory: Most recent CBC Lab Results  Component Value Date   WBC 5.1  08/28/2022   HGB 9.6 (L) 08/28/2022   HCT 27.2 (L) 08/28/2022   MCV 107.5 (H) 08/28/2022   PLT 170 08/28/2022   Most recent BMP    Latest Ref Rng & Units 08/25/2022    2:53 AM  BMP  Glucose 70 - 99 mg/dL 98   BUN 8 - 23 mg/dL 11   Creatinine 6.81 - 1.24 mg/dL 1.57   Sodium 262 - 035 mmol/L 142   Potassium 3.5 - 5.1 mmol/L 4.1   Chloride 98 - 111 mmol/L 109   CO2 22 - 32 mmol/L 26   Calcium 8.9 - 10.3 mg/dL 8.5      Elberta Fortis, MD 08/31/2022, 4:07 PM  PGY-1, [facility] Family Medicine FPTS Intern pager: [phone], text pages welcome Secure chat group [facility] Teaching Service

## 2022-09-01 DIAGNOSIS — I951 Orthostatic hypotension: Secondary | ICD-10-CM | POA: Diagnosis not present

## 2022-09-01 MED ORDER — ENSURE ENLIVE PO LIQD
237.0000 mL | Freq: Two times a day (BID) | ORAL | Status: DC
  Administered 2022-09-02: 237 mL via ORAL

## 2022-09-01 NOTE — Progress Notes (Signed)
     Daily Progress Note Intern Pager: [phone]  Patient name: John Fisher Medical record number: [id number] Date of birth: [birth year] Age: 66 y.o. Gender: male  Primary Care Provider: [facility] Consultants: None Code Status: Full code   Pt Overview and Major Events to Date:  12/11: Admitted to FMTS service     Assessment and Plan: John Fisher is a 66 y.o. male admitted for syncope in the setting of poor PO intake due to dental pain and depression, macrocytic anemia and dehydration with concomitant alcohol use disorder. PMHx includes HTN, T2DM, MDD, alcohol use disorder and THC use.  * Syncope Continues to work with PT/OT to regain strength. Advanced to regular diet to help patient increase intake. -Continue nutritional supplement and regular diet -PT/OT recommending SNF  Macrocytic anemia-resolved as of 09/01/2022 Stable, likely secondary to nutritional deficiency. Recheck labs in outpatient setting   FEN/GI: Regular PPx: Lovenox Dispo: SNF pending placement  Subjective:  Patient assessed at bedside, expressed frustration about getting all prepared to leave only to be told the SNF did not take him. States he wants to keep regaining strength in the hospital and work on his diet until we find a new placement. State he wants a regular diet because it will help him eat more and he knows what he can and cannot swallow.  Objective: Temp:  [98.1 F (36.7 C)-99 F (37.2 C)] 98.3 F (36.8 C) (12/19 0549) Pulse Rate:  [67-96] 96 (12/19 0549) Resp:  [17] 17 (12/19 0549) BP: (125-146)/(88-98) 146/96 (12/19 0549) SpO2:  [99 %-100 %] 100 % (12/19 0549) Physical Exam: General: Well-appearing. Alert. NAD HEENT: Poor dentition missing multiple teeth Cardiovascular: RRR, no murmur Respiratory: CTAB. Normal WOB on RA. No wheezing Abdomen: Soft, non-tender, non-distended Extremities: No peripheral edema.  Laboratory: Most recent CBC Lab Results  Component Value  Date   WBC 5.1 08/28/2022   HGB 9.6 (L) 08/28/2022   HCT 27.2 (L) 08/28/2022   MCV 107.5 (H) 08/28/2022   PLT 170 08/28/2022   Most recent BMP    Latest Ref Rng & Units 08/25/2022    2:53 AM  BMP  Glucose 70 - 99 mg/dL 98   BUN 8 - 23 mg/dL 11   Creatinine 4.93 - 1.24 mg/dL 5.52   Sodium 174 - 715 mmol/L 142   Potassium 3.5 - 5.1 mmol/L 4.1   Chloride 98 - 111 mmol/L 109   CO2 22 - 32 mmol/L 26   Calcium 8.9 - 10.3 mg/dL 8.5     Elberta Fortis, MD 09/01/2022, 9:50 AM  PGY-1, [facility] FPTS Intern pager: [phone], text pages welcome Secure chat group [facility] Teaching Service

## 2022-09-01 NOTE — Progress Notes (Signed)
Nutrition Follow-up  DOCUMENTATION CODES:   Non-severe (moderate) malnutrition in context of chronic illness  INTERVENTION:  Encourage adequate PO intake Decrease Ensure Enlive po from TID to BID, each supplement provides 350 kcal and 20 grams of protein. Continue MVI with minerals daily  NUTRITION DIAGNOSIS:   Moderate Malnutrition related to chronic illness (EtOH use, dysphagia) as evidenced by mild fat depletion, moderate muscle depletion.  Ongoing  GOAL:   Patient will meet greater than or equal to 90% of their needs  Progressing, po intake improving  MONITOR:   PO intake, Supplement acceptance, Diet advancement, Weight trends, Labs  REASON FOR ASSESSMENT:   Consult Assessment of nutrition requirement/status  ASSESSMENT:   Pt admitted with multiple episodes of syncope in setting of poor PO intake. PMH significant for HTN, T2DM, MDD, alcohol use disorder, and THC use.  12/12: s/p FEES- dysphagia 1 diet 12/17: diet advanced to dysphagia 3 12/18: diet advanced to regular    Pt is medically stable for discharge, pending SNF placement.   Pt has inpatient dental assessment. Missing teeth causing difficulty with chewing or biting however not suspecting that this is the cause of his poor PO intake/dysphagia.  Spoke with pt at bedside. He is pleased with being back on a regular diet. He reports taking small bites and chewing as best at he can. He recalls eating about 50% of each meals daily. He has not been drinking Ensure supplements for fear of constipation. Discussed alternative supplements but he prefers to resume Ensure. Encouraged him to try to consume these supplements to increase his nutritional intake as well as fluid intake as this is of concern to him. Pt currently having regular BM's. Discussed with RN.   Meal completions: 12/15: 100% breakfast, 50% dinner 12/16: 50% breakfast, 100% lunch, 50% dinner  No recently updated weight on file to review.    Medications: folvite, MVI, thiamine  Labs: reviewed  Diet Order:   Diet Order             Diet regular Room service appropriate? Yes; Fluid consistency: Thin  Diet effective now                   EDUCATION NEEDS:   Education needs have been addressed  Skin:  Skin Assessment: Reviewed RN Assessment  Last BM:  12/18  Height:   Ht Readings from Last 1 Encounters:  08/24/22 6' (1.829 m)    Weight:   Wt Readings from Last 1 Encounters:  08/24/22 77.1 kg   BMI:  Body mass index is 23.06 kg/m.  Estimated Nutritional Needs:   Kcal:  2000-2200  Protein:  100-115g  Fluid:  >/=2L  Drusilla Kanner, RDN, LDN Clinical Nutrition

## 2022-09-01 NOTE — TOC Progression Note (Signed)
Transition of Care [facility]) - Progression Note    Patient Details  Name: John Fisher MRN: [id number] Date of Birth: Jul 28, 1956  [facility]) CM/SW Contact  Lorri Frederick, LCSW Phone Number: 09/01/2022, 3:57 PM  Clinical Narrative:   TC from [facility].  Pt was approved for 30 days of SNF.  They will send the optum list of potential providers.    Expected Discharge Plan: Skilled Nursing Facility Barriers to Discharge: Insurance Authorization, No SNF bed  Expected Discharge Plan and Services Expected Discharge Plan: Skilled Nursing Facility In-house Referral: Clinical Social Work   Post Acute Care Choice: Skilled Nursing Facility Living arrangements for the past 2 months: Apartment Expected Discharge Date: 08/31/22                                     Social Determinants of Health (SDOH) Interventions    Readmission Risk Interventions     No data to display

## 2022-09-02 DIAGNOSIS — I951 Orthostatic hypotension: Secondary | ICD-10-CM | POA: Diagnosis not present

## 2022-09-02 LAB — GLUCOSE, CAPILLARY: Glucose-Capillary: 97 mg/dL (ref 70–99)

## 2022-09-02 MED ORDER — ENSURE ENLIVE PO LIQD: 237.0000 mL | Freq: Two times a day (BID) | ORAL | 12 refills | Status: DC

## 2022-09-02 NOTE — Progress Notes (Signed)
OT Cancellation Note  Patient Details Name: John Fisher MRN: [id number] DOB: [birth year]   Cancelled Treatment:    Reason Eval/Treat Not Completed: Patient declined, no reason specified (Pt states that they have found a SNF in [place] for him to go to and he may be leaving today. He's wants to hold off on therapy today as he may be leaving.)  Limmie Patricia, OTR/L,CBIS  Supplemental OT - [facility]  09/02/2022, 2:30 PM

## 2022-09-02 NOTE — TOC Progression Note (Addendum)
Transition of Care [facility]) - Progression Note    Patient Details  Name: John Fisher MRN: [id number] Date of Birth: 07-21-1956  Transition of Care [facility]) CM/SW Contact  Lorri Frederick, LCSW Phone Number: 09/02/2022, 12:43 PM  Clinical Narrative:    CSW calling SNF facilities on [place] optum list:   CSW spoke with [facility].  They are unable to offer pt bed under either medicare or under VA funding.  CSW spoke with [facility].  Her business office also told her that they cannot take under medicare due to his inpatient stay being billed to the [place].  They are not a VA provider, even though they are on the list.  Lawerance Cruel is calling another person in the main office to see if there is anything that can be done.    CSW spoke with [facility].  They do not accept VA payer.  [facility] does not accept VA funding.   1315: CSW spoke with pt, updated him on the issues locating SNF placement.  Discussed option of going home.  Pt states he absolutely does not want to go home as he is sure he will fall.  He is willing to go to any SNF anywhere in the state if transportation will be provided.    [facility] cannot accept [facility].   1415: Message from [facility].  She can accept pt, will be under medicare, can take today.  MD informed.  CSW spoke with pt about transport--he does not have anyone who can transport him to SNF.    Planned Disposition: Skilled Nursing Facility Barriers to Discharge: English as a second language teacher, No SNF bed  Expected Discharge Plan and Services In-house Referral: Clinical Social Work   Post Acute Care Choice: Skilled Nursing Facility Living arrangements for the past 2 months: Apartment Expected Discharge Date: 08/31/22                                     Social Determinants of Health (SDOH) Interventions SDOH Screenings   Food Insecurity: No Food Insecurity (08/28/2022)  Housing: Low Risk  (08/28/2022)   Transportation Needs: No Transportation Needs (08/28/2022)  Utilities: Not At Risk (08/28/2022)  Tobacco Use: Low Risk  (08/27/2022)    Readmission Risk Interventions     No data to display

## 2022-09-02 NOTE — Discharge Summary (Signed)
[facility] Discharge Summary  Patient name: John Fisher Medical record number: [id number] Date of birth: 1956/02/26 Age: 66 y.o. Gender: male Date of Admission: 08/24/2022  Date of Discharge: 09/02/2022 Admitting Physician: Colletta Maryland, MD  Primary Care Provider: [facility] Consultants: Dentistry, PT/OT, SLP  Indication for Hospitalization: Syncope   Discharge Diagnoses/Problem List:  Principal Problem for Admission: Syncope  Other Problems addressed during stay:  Principal Problem:   Syncope Active Problems:   Dysphagia   Malnutrition of moderate degree   Poor dentition   Weakness   Orthostatic syncope   Encounter for dental examination   Caries   Chronic apical periodontitis   Accretions on teeth   Teeth missing    Brief Hospital Course:  John Fisher is a 66 y.o.male with a history of HTN, T2DM, MDD, alcohol use disorder and THC use who was admitted to the [facility] Medicine Teaching Service at [facility] for syncope. His hospital course is detailed below:  Syncope Presents with four episodes of syncope occurring over a two week period associated with dental pain and weight loss. Likely multifactorial in nature, stemming from prolonged poor PO intake due to dental pain and swallowing difficulties. Orthostasis due to dehydration in the setting chronic alcohol use disorder and low fluid intake. Inpatient dentistry consulted, no concern for infection or need for inpatient workup. SLP consulted, patient had FEES showing severe oral dysphagia likely secondary to dental issues. Recommended outpatient f/u with resolving dental concern. Macrocytic anemia and muscle deconditioning likely due to nutritional deficiency. Nutrition consulted, and patient started on oral supplementation. Discharged to acute rehab per PT/OT.  Other chronic conditions were medically managed with home medications and formulary alternatives as necessary (HLD,  HTN)   Disposition: SNF  Discharge Condition: Stable   Issues for Follow Up: PCP Follow-up Recommendations:  Alcohol use disorder cessation Outpatient colonoscopy given anemia, weight loss and lack of recent screening in the chart Check CBGs to possibly restart diabetes medications. Monitor blood pressure--would restart amlodipine if STANDING blood pressures elevated given syncope  Repeat CBC and BMP in 1 week  (baseline creatinine near 1.6).   Discharge Exam:  Vitals:   09/02/22 0521 09/02/22 0753  BP: (!) 137/98 (!) 148/97  Pulse: 78 70  Resp: 16 17  Temp: 98.2 F (36.8 C) 98.2 F (36.8 C)  SpO2: 100% 100%   General: Well-appearing. Alert. NAD HEENT: Poor dentition missing multiple teeth Cardiovascular: RRR, no murmur Respiratory: CTAB. Normal WOB on RA. No wheezing Abdomen: Soft, non-tender, non-distended Extremities: No peripheral edema.   CT Head IMPRESSION: No evidence of intracranial injury.  Orthopantogram IMPRESSION: 1. Extremely poor dentition, with large erosion of the left inferior premolar as well as lucency surrounding the root.    Results/Tests Pending at Time of Discharge: None   Discharge Medications:  Allergies as of 09/02/2022       Reactions   Trospium    Other Reaction(s): Retention of urine   Alfuzosin Anxiety        Medication List     STOP taking these medications    amLODipine 10 MG tablet Commonly known as: NORVASC   Cyanocobalamin 1000 MCG Caps   EPINEPHrine 0.3 mg/0.3 mL Soaj injection Commonly known as: EPI-PEN   metFORMIN 500 MG 24 hr tablet Commonly known as: GLUCOPHAGE-XR   montelukast 10 MG tablet Commonly known as: Singulair   Vitamin D (Ergocalciferol) 1.25 MG (50000 UNIT) Caps capsule Commonly known as: DRISDOL  TAKE these medications    aspirin EC 81 MG tablet Take 81 mg by mouth daily.   atorvastatin 20 MG tablet Commonly known as: LIPITOR Take 10 mg by mouth daily.   feeding  supplement Liqd Take 237 mLs by mouth 2 (two) times daily between meals.   fluticasone 50 MCG/ACT nasal spray Commonly known as: FLONASE Place 1 spray into both nostrils 2 (two) times daily as needed for allergies or rhinitis.   folic acid 1 MG tablet Commonly known as: FOLVITE Take 1 tablet (1 mg total) by mouth daily.   naloxone 4 MG/0.1ML Liqd nasal spray kit Commonly known as: NARCAN Place 1 spray into the nose once.   tamsulosin 0.4 MG Caps capsule Commonly known as: FLOMAX Take 0.4 mg by mouth.        Discharge Instructions: Please refer to Patient Instructions section of EMR for full details.  Patient was counseled important signs and symptoms that should prompt return to medical care, changes in medications, dietary instructions, activity restrictions, and follow up appointments.   Follow-Up Appointments:  Please schedule Dentistry and PCP follow up after discharge from SNF.

## 2022-09-02 NOTE — TOC Transition Note (Signed)
Transition of Care [facility]) - CM/SW Discharge Note   Patient Details  Name: John Fisher MRN: [id number] Date of Birth: 1955/10/22  [facility] CM/SW Contact:  Lorri Frederick, LCSW Phone Number: 09/02/2022, 3:23 PM   Clinical Narrative:    Pt discharging to [facility], room 604P.  RN call [phone] for report.  Pt will need to be taken to main north tower entrance to meet safe transport van.    Final next level of care: Skilled Nursing Facility Barriers to Discharge: Barriers Resolved   Patient Goals and CMS Choice Patient states their goals for this hospitalization and ongoing recovery are:: walk without passing out   Choice offered to / list presented to : Patient    Discharge Placement              Patient chooses bed at:  [facility]) Patient to be transferred to facility by: safe transport Name of family member notified: left message with daughter [name] Patient and family notified of of transfer: 09/02/22  Discharge Plan and Services In-house Referral: Clinical Social Work   Post Acute Care Choice: Skilled Nursing Facility                               Social Determinants of Health (SDOH) Interventions     Readmission Risk Interventions     No data to display

## 2022-09-02 NOTE — Plan of Care (Signed)
Patient ready to discharge with no s/s of acute distress. Nurse to nurse report given to Rachel Bo, RN at [facility]. Pt. Verbalizes readiness to discharge. AVS provided to patient, transported to facility via wheelchair van.   Problem: Education: Goal: Knowledge of General Education information will improve Description: Including pain rating scale, medication(s)/side effects and non-pharmacologic comfort measures Outcome: Adequate for Discharge   Problem: Health Behavior/Discharge Planning: Goal: Ability to manage health-related needs will improve Outcome: Adequate for Discharge   Problem: Clinical Measurements: Goal: Ability to maintain clinical measurements within normal limits will improve Outcome: Adequate for Discharge Goal: Will remain free from infection Outcome: Adequate for Discharge Goal: Diagnostic test results will improve Outcome: Adequate for Discharge Goal: Respiratory complications will improve Outcome: Adequate for Discharge Goal: Cardiovascular complication will be avoided Outcome: Adequate for Discharge   Problem: Activity: Goal: Risk for activity intolerance will decrease Outcome: Adequate for Discharge   Problem: Nutrition: Goal: Adequate nutrition will be maintained Outcome: Adequate for Discharge   Problem: Coping: Goal: Level of anxiety will decrease Outcome: Adequate for Discharge   Problem: Elimination: Goal: Will not experience complications related to bowel motility Outcome: Adequate for Discharge Goal: Will not experience complications related to urinary retention Outcome: Adequate for Discharge   Problem: Pain Managment: Goal: General experience of comfort will improve Outcome: Adequate for Discharge   Problem: Safety: Goal: Ability to remain free from injury will improve Outcome: Adequate for Discharge   Problem: Skin Integrity: Goal: Risk for impaired skin integrity will decrease Outcome: Adequate for Discharge   Problem:  Education: Goal: Knowledge of General Education information will improve Description: Including pain rating scale, medication(s)/side effects and non-pharmacologic comfort measures Outcome: Adequate for Discharge   Problem: Health Behavior/Discharge Planning: Goal: Ability to manage health-related needs will improve Outcome: Adequate for Discharge   Problem: Clinical Measurements: Goal: Ability to maintain clinical measurements within normal limits will improve Outcome: Adequate for Discharge Goal: Will remain free from infection Outcome: Adequate for Discharge Goal: Diagnostic test results will improve Outcome: Adequate for Discharge Goal: Respiratory complications will improve Outcome: Adequate for Discharge Goal: Cardiovascular complication will be avoided Outcome: Adequate for Discharge   Problem: Activity: Goal: Risk for activity intolerance will decrease Outcome: Adequate for Discharge   Problem: Nutrition: Goal: Adequate nutrition will be maintained Outcome: Adequate for Discharge   Problem: Coping: Goal: Level of anxiety will decrease Outcome: Adequate for Discharge   Problem: Elimination: Goal: Will not experience complications related to bowel motility Outcome: Adequate for Discharge Goal: Will not experience complications related to urinary retention Outcome: Adequate for Discharge   Problem: Pain Managment: Goal: General experience of comfort will improve Outcome: Adequate for Discharge   Problem: Safety: Goal: Ability to remain free from injury will improve Outcome: Adequate for Discharge   Problem: Skin Integrity: Goal: Risk for impaired skin integrity will decrease Outcome: Adequate for Discharge

## 2022-09-02 NOTE — Progress Notes (Signed)
     Daily Progress Note Intern Pager: [phone]  Patient name: John Fisher Medical record number: [id number] Date of birth: [birth year] Age: 66 y.o. Gender: male  [facility] Consultants: None Code Status: Full code   Pt Overview and Major Events to Date:  12/11: Admitted to FMTS service     Assessment and Plan: John Fisher is a 66 y.o. male admitted for syncope in the setting of poor PO intake due to dental pain and depression, macrocytic anemia and dehydration with concomitant alcohol use disorder. PMHx includes HTN, T2DM, MDD, alcohol use disorder and THC use.  * Syncope Eating well, plan to continue at least one Ensure per day. Plan to continue PT/OT here while pending rehab placement. -Continue nutritional supplement and regular diet -PT/OT recommending SNF  Macrocytic anemia-resolved as of 09/01/2022 Stable, likely secondary to nutritional deficiency. Recheck labs in outpatient setting   FEN/GI: Regular PPx: Lovenox Dispo: SNF pending placement  Subjective:  Patient assessed at bedside, he was initially frustrated given someone called him stating he could not go to rehab. States he would "go home and die" due to concern for falling and weakness. States they also called his daughter which he requested not happen. States he is otherwise going to try and drink the Ensure today but states it makes him constipated if he has too much.  Patient medically stable for SNF.  Objective: Temp:  [98.2 F (36.8 C)-98.8 F (37.1 C)] 98.2 F (36.8 C) (12/20 0753) Pulse Rate:  [70-78] 70 (12/20 0753) Resp:  [16-18] 17 (12/20 0753) BP: (132-148)/(94-98) 148/97 (12/20 0753) SpO2:  [99 %-100 %] 100 % (12/20 0753) Physical Exam: General: Well-appearing. Alert. NAD HEENT: Poor dentition missing multiple teeth Cardiovascular: RRR, no murmur Respiratory: CTAB. Normal WOB on RA. No wheezing Abdomen: Soft, non-tender, non-distended Extremities: No  peripheral edema.  Laboratory: Most recent CBC Lab Results  Component Value Date   WBC 5.1 08/28/2022   HGB 9.6 (L) 08/28/2022   HCT 27.2 (L) 08/28/2022   MCV 107.5 (H) 08/28/2022   PLT 170 08/28/2022   Most recent BMP    Latest Ref Rng & Units 08/25/2022    2:53 AM  BMP  Glucose 70 - 99 mg/dL 98   BUN 8 - 23 mg/dL 11   Creatinine 0.93 - 1.24 mg/dL 2.35   Sodium 573 - 220 mmol/L 142   Potassium 3.5 - 5.1 mmol/L 4.1   Chloride 98 - 111 mmol/L 109   CO2 22 - 32 mmol/L 26   Calcium 8.9 - 10.3 mg/dL 8.5     John Fortis, MD 09/02/2022, 12:06 PM  PGY-1, [facility] FPTS Intern pager: [phone], text pages welcome Secure chat group [facility] Teaching Service

## 2022-12-03 ENCOUNTER — Inpatient Hospital Stay (HOSPITAL_COMMUNITY)
Admission: EM | Admit: 2022-12-03 | Discharge: 2022-12-06 | DRG: 897 | Disposition: A | Discharge status: Home / Self Care | Payer: No Typology Code available for payment source | Attending: Internal Medicine | Admitting: Internal Medicine

## 2022-12-03 ENCOUNTER — Other Ambulatory Visit: Payer: Self-pay

## 2022-12-03 ENCOUNTER — Encounter (HOSPITAL_COMMUNITY): Payer: Self-pay

## 2022-12-03 DIAGNOSIS — E162 Hypoglycemia, unspecified: Secondary | ICD-10-CM | POA: Diagnosis present

## 2022-12-03 DIAGNOSIS — F10130 Alcohol abuse with withdrawal, uncomplicated: Secondary | ICD-10-CM | POA: Diagnosis present

## 2022-12-03 DIAGNOSIS — N529 Male erectile dysfunction, unspecified: Secondary | ICD-10-CM | POA: Diagnosis present

## 2022-12-03 DIAGNOSIS — M1990 Unspecified osteoarthritis, unspecified site: Secondary | ICD-10-CM | POA: Diagnosis present

## 2022-12-03 DIAGNOSIS — E538 Deficiency of other specified B group vitamins: Secondary | ICD-10-CM | POA: Diagnosis present

## 2022-12-03 DIAGNOSIS — Z8249 Family history of ischemic heart disease and other diseases of the circulatory system: Secondary | ICD-10-CM

## 2022-12-03 DIAGNOSIS — Z833 Family history of diabetes mellitus: Secondary | ICD-10-CM

## 2022-12-03 DIAGNOSIS — Z96653 Presence of artificial knee joint, bilateral: Secondary | ICD-10-CM | POA: Diagnosis present

## 2022-12-03 DIAGNOSIS — Z7982 Long term (current) use of aspirin: Secondary | ICD-10-CM

## 2022-12-03 DIAGNOSIS — N1832 Chronic kidney disease, stage 3b: Secondary | ICD-10-CM | POA: Diagnosis present

## 2022-12-03 DIAGNOSIS — D531 Other megaloblastic anemias, not elsewhere classified: Secondary | ICD-10-CM | POA: Diagnosis present

## 2022-12-03 DIAGNOSIS — E8729 Other acidosis: Secondary | ICD-10-CM | POA: Diagnosis present

## 2022-12-03 DIAGNOSIS — K029 Dental caries, unspecified: Secondary | ICD-10-CM | POA: Diagnosis present

## 2022-12-03 DIAGNOSIS — E1140 Type 2 diabetes mellitus with diabetic neuropathy, unspecified: Secondary | ICD-10-CM | POA: Diagnosis present

## 2022-12-03 DIAGNOSIS — F10129 Alcohol abuse with intoxication, unspecified: Principal | ICD-10-CM | POA: Diagnosis present

## 2022-12-03 DIAGNOSIS — I951 Orthostatic hypotension: Secondary | ICD-10-CM | POA: Diagnosis present

## 2022-12-03 DIAGNOSIS — R1310 Dysphagia, unspecified: Secondary | ICD-10-CM

## 2022-12-03 DIAGNOSIS — W19XXXA Unspecified fall, initial encounter: Secondary | ICD-10-CM | POA: Diagnosis present

## 2022-12-03 DIAGNOSIS — R5381 Other malaise: Secondary | ICD-10-CM | POA: Diagnosis present

## 2022-12-03 DIAGNOSIS — E875 Hyperkalemia: Secondary | ICD-10-CM | POA: Diagnosis present

## 2022-12-03 DIAGNOSIS — E119 Type 2 diabetes mellitus without complications: Secondary | ICD-10-CM

## 2022-12-03 DIAGNOSIS — E785 Hyperlipidemia, unspecified: Secondary | ICD-10-CM | POA: Diagnosis present

## 2022-12-03 DIAGNOSIS — Z888 Allergy status to other drugs, medicaments and biological substances status: Secondary | ICD-10-CM

## 2022-12-03 DIAGNOSIS — I10 Essential (primary) hypertension: Secondary | ICD-10-CM | POA: Diagnosis present

## 2022-12-03 DIAGNOSIS — Z79899 Other long term (current) drug therapy: Secondary | ICD-10-CM

## 2022-12-03 DIAGNOSIS — I129 Hypertensive chronic kidney disease with stage 1 through stage 4 chronic kidney disease, or unspecified chronic kidney disease: Secondary | ICD-10-CM | POA: Diagnosis present

## 2022-12-03 DIAGNOSIS — N179 Acute kidney failure, unspecified: Secondary | ICD-10-CM | POA: Diagnosis present

## 2022-12-03 DIAGNOSIS — R296 Repeated falls: Secondary | ICD-10-CM | POA: Diagnosis present

## 2022-12-03 DIAGNOSIS — F10939 Alcohol use, unspecified with withdrawal, unspecified: Secondary | ICD-10-CM | POA: Diagnosis present

## 2022-12-03 DIAGNOSIS — E1122 Type 2 diabetes mellitus with diabetic chronic kidney disease: Secondary | ICD-10-CM | POA: Diagnosis present

## 2022-12-03 DIAGNOSIS — E11649 Type 2 diabetes mellitus with hypoglycemia without coma: Secondary | ICD-10-CM | POA: Diagnosis present

## 2022-12-03 DIAGNOSIS — Z7984 Long term (current) use of oral hypoglycemic drugs: Secondary | ICD-10-CM

## 2022-12-03 DIAGNOSIS — F10930 Alcohol use, unspecified with withdrawal, uncomplicated: Secondary | ICD-10-CM

## 2022-12-03 LAB — COMPREHENSIVE METABOLIC PANEL
ALT: 20 U/L (ref 0–44)
AST: 34 U/L (ref 15–41)
Albumin: 3.8 g/dL (ref 3.5–5.0)
Alkaline Phosphatase: 55 U/L (ref 38–126)
Anion gap: 25 — ABNORMAL HIGH (ref 5–15)
BUN: 27 mg/dL — ABNORMAL HIGH (ref 8–23)
CO2: 10 mmol/L — ABNORMAL LOW (ref 22–32)
Calcium: 9.5 mg/dL (ref 8.9–10.3)
Chloride: 104 mmol/L (ref 98–111)
Creatinine, Ser: 1.93 mg/dL — ABNORMAL HIGH (ref 0.61–1.24)
GFR, Estimated: 37 mL/min — ABNORMAL LOW (ref >60)
Glucose, Bld: 111 mg/dL — ABNORMAL HIGH (ref 70–99)
Potassium: 5.2 mmol/L — ABNORMAL HIGH (ref 3.5–5.1)
Sodium: 139 mmol/L (ref 135–145)
Total Bilirubin: 0.6 mg/dL (ref 0.3–1.2)
Total Protein: 7.9 g/dL (ref 6.5–8.1)

## 2022-12-03 LAB — BLOOD GAS, VENOUS
Acid-base deficit: 6.8 mmol/L — ABNORMAL HIGH (ref 0.0–2.0)
Bicarbonate: 17.7 mmol/L — ABNORMAL LOW (ref 20.0–28.0)
O2 Saturation: 68.7 %
Patient temperature: 37
pCO2, Ven: 32 mmHg — ABNORMAL LOW (ref 44–60)
pH, Ven: 7.35 (ref 7.25–7.43)
pO2, Ven: 36 mmHg (ref 32–45)

## 2022-12-03 LAB — URINALYSIS, ROUTINE W REFLEX MICROSCOPIC
Bilirubin Urine: NEGATIVE
Glucose, UA: NEGATIVE mg/dL
Hgb urine dipstick: NEGATIVE
Ketones, ur: 80 mg/dL — AB
Leukocytes,Ua: NEGATIVE
Nitrite: NEGATIVE
Protein, ur: 30 mg/dL — AB
Specific Gravity, Urine: 1.016 (ref 1.005–1.030)
pH: 5 (ref 5.0–8.0)

## 2022-12-03 LAB — CBC WITH DIFFERENTIAL/PLATELET
Abs Immature Granulocytes: 0.09 10*3/uL — ABNORMAL HIGH (ref 0.00–0.07)
Basophils Absolute: 0 10*3/uL (ref 0.0–0.1)
Basophils Relative: 0 %
Eosinophils Absolute: 0 10*3/uL (ref 0.0–0.5)
Eosinophils Relative: 0 %
HCT: 39.8 % (ref 39.0–52.0)
Hemoglobin: 12.4 g/dL — ABNORMAL LOW (ref 13.0–17.0)
Immature Granulocytes: 1 %
Lymphocytes Relative: 4 %
Lymphs Abs: 0.5 10*3/uL — ABNORMAL LOW (ref 0.7–4.0)
MCH: 33 pg (ref 26.0–34.0)
MCHC: 31.2 g/dL (ref 30.0–36.0)
MCV: 105.9 fL — ABNORMAL HIGH (ref 80.0–100.0)
Monocytes Absolute: 0.4 10*3/uL (ref 0.1–1.0)
Monocytes Relative: 3 %
Neutro Abs: 13.2 10*3/uL — ABNORMAL HIGH (ref 1.7–7.7)
Neutrophils Relative %: 92 %
Platelets: 255 10*3/uL (ref 150–400)
RBC: 3.76 MIL/uL — ABNORMAL LOW (ref 4.22–5.81)
RDW: 14.9 % (ref 11.5–15.5)
WBC: 14.2 10*3/uL — ABNORMAL HIGH (ref 4.0–10.5)
nRBC: 0 % (ref 0.0–0.2)

## 2022-12-03 LAB — BASIC METABOLIC PANEL
Anion gap: 16 — ABNORMAL HIGH (ref 5–15)
BUN: 28 mg/dL — ABNORMAL HIGH (ref 8–23)
CO2: 16 mmol/L — ABNORMAL LOW (ref 22–32)
Calcium: 8.8 mg/dL — ABNORMAL LOW (ref 8.9–10.3)
Chloride: 101 mmol/L (ref 98–111)
Creatinine, Ser: 1.89 mg/dL — ABNORMAL HIGH (ref 0.61–1.24)
GFR, Estimated: 38 mL/min — ABNORMAL LOW (ref >60)
Glucose, Bld: 257 mg/dL — ABNORMAL HIGH (ref 70–99)
Potassium: 5.1 mmol/L (ref 3.5–5.1)
Sodium: 133 mmol/L — ABNORMAL LOW (ref 135–145)

## 2022-12-03 LAB — GLUCOSE, CAPILLARY: Glucose-Capillary: 112 mg/dL — ABNORMAL HIGH (ref 70–99)

## 2022-12-03 LAB — ETHANOL: Alcohol, Ethyl (B): <10 mg/dL (ref <10)

## 2022-12-03 LAB — CBG MONITORING, ED
Glucose-Capillary: 94 mg/dL (ref 70–99)
Glucose-Capillary: 266 mg/dL — ABNORMAL HIGH (ref 70–99)

## 2022-12-03 LAB — MAGNESIUM: Magnesium: 1.8 mg/dL (ref 1.7–2.4)

## 2022-12-03 LAB — ACETAMINOPHEN LEVEL: Acetaminophen (Tylenol), Serum: <10 ug/mL — ABNORMAL LOW (ref 10–30)

## 2022-12-03 LAB — PHOSPHORUS: Phosphorus: 5.3 mg/dL — ABNORMAL HIGH (ref 2.5–4.6)

## 2022-12-03 LAB — SALICYLATE LEVEL: Salicylate Lvl: <7 mg/dL — ABNORMAL LOW (ref 7.0–30.0)

## 2022-12-03 MED ORDER — ENOXAPARIN SODIUM 40 MG/0.4ML IJ SOSY
40.0000 mg | PREFILLED_SYRINGE | INTRAMUSCULAR | Status: DC
  Administered 2022-12-03 – 2022-12-04 (×2): 40 mg via SUBCUTANEOUS
  Filled 2022-12-03 (×2): qty 0.4

## 2022-12-03 MED ORDER — LORAZEPAM 2 MG/ML IJ SOLN: 1.0000 mg | INTRAMUSCULAR | Status: DC | PRN

## 2022-12-03 MED ORDER — ACETAMINOPHEN 325 MG PO TABS: 650.0000 mg | ORAL_TABLET | Freq: Four times a day (QID) | ORAL | Status: DC | PRN

## 2022-12-03 MED ORDER — FOLIC ACID 1 MG PO TABS
1.0000 mg | ORAL_TABLET | Freq: Every day | ORAL | Status: DC
  Administered 2022-12-03 – 2022-12-05 (×3): 1 mg via ORAL
  Filled 2022-12-03 (×3): qty 1

## 2022-12-03 MED ORDER — INSULIN ASPART 100 UNIT/ML IJ SOLN
0.0000 [IU] | INTRAMUSCULAR | Status: DC
  Filled 2022-12-03: qty 0.15

## 2022-12-03 MED ORDER — THIAMINE HCL 100 MG/ML IJ SOLN
100.0000 mg | Freq: Every day | INTRAMUSCULAR | Status: DC
  Filled 2022-12-03: qty 2

## 2022-12-03 MED ORDER — INSULIN ASPART 100 UNIT/ML IJ SOLN: 0.0000 [IU] | Freq: Three times a day (TID) | INTRAMUSCULAR | Status: DC

## 2022-12-03 MED ORDER — ADULT MULTIVITAMIN W/MINERALS CH
1.0000 | ORAL_TABLET | Freq: Every day | ORAL | Status: DC
  Administered 2022-12-03 – 2022-12-05 (×3): 1 via ORAL
  Filled 2022-12-03 (×3): qty 1

## 2022-12-03 MED ORDER — SENNOSIDES-DOCUSATE SODIUM 8.6-50 MG PO TABS: 1.0000 | ORAL_TABLET | Freq: Every evening | ORAL | Status: DC | PRN

## 2022-12-03 MED ORDER — SODIUM CHLORIDE 0.9 % IV BOLUS
1000.0000 mL | Freq: Once | INTRAVENOUS | Status: AC
  Administered 2022-12-03: 1000 mL via INTRAVENOUS

## 2022-12-03 MED ORDER — INSULIN ASPART 100 UNIT/ML IJ SOLN
0.0000 [IU] | Freq: Every day | INTRAMUSCULAR | Status: DC
  Filled 2022-12-03: qty 0.05

## 2022-12-03 MED ORDER — LORAZEPAM 1 MG PO TABS: 1.0000 mg | ORAL_TABLET | ORAL | Status: DC | PRN

## 2022-12-03 MED ORDER — LORAZEPAM 2 MG/ML IJ SOLN: 0.0000 mg | Freq: Two times a day (BID) | INTRAMUSCULAR | Status: DC

## 2022-12-03 MED ORDER — THIAMINE MONONITRATE 100 MG PO TABS
100.0000 mg | ORAL_TABLET | Freq: Every day | ORAL | Status: DC
  Administered 2022-12-03 – 2022-12-05 (×3): 100 mg via ORAL
  Filled 2022-12-03 (×3): qty 1

## 2022-12-03 MED ORDER — ACETAMINOPHEN 650 MG RE SUPP: 650.0000 mg | Freq: Four times a day (QID) | RECTAL | Status: DC | PRN

## 2022-12-03 MED ORDER — LORAZEPAM 2 MG/ML IJ SOLN
0.0000 mg | Freq: Four times a day (QID) | INTRAMUSCULAR | Status: AC
  Filled 2022-12-03: qty 1

## 2022-12-03 NOTE — Progress Notes (Signed)
Patient is transferred from ED to [place]. Alert and oriented x 4. Patient does not complain about pain. Vital signs was taken and room is set up. Call light was within patient's reach.

## 2022-12-03 NOTE — H&P (Addendum)
PCP:   [facility]   Chief Complaint:  Weakness, dizziness  HPI: This is a 67 year old male with past medical history of diabetes mellitus, hyperlipidemia, ongoing alcohol use, poor dentition leading to poor p.o. intake and hypoglycemia.  Per patient, he is here because he has been dizzy and falling down.  Per patient this has been ongoing for a while but it is now getting worse.  He attributes this to his poor dentition, poor p.o. intake which is likely contributing to significant weight loss.  This plus his ongoing alcohol use has resulted in recurrent hypoglycemia and syncopal episodes.  Per patient he has a hard time getting out of bed without being dizzy and woozy.  This occurred today, he did not syncopized, he was just weak.  EMS was called. When EMS arrived, patient's fingerstick blood sugar was 40.  He was given orange juice.  He was brought to the ER.  In the ER patient labs showed an anion gap of 25, CO2 of 10, potassium 5.2, WBCs 14.2, MCV 105.9.  There was concern for alcohol withdrawal and alcoholic ketoacidosis.  Overnight admission requested   Review of Systems:  The patient denies anorexia, fever, weight loss,, vision loss, decreased hearing, hoarseness, chest pain, syncope, dyspnea on exertion, peripheral edema, balance deficits, hemoptysis, abdominal pain, melena, hematochezia, severe indigestion/heartburn, hematuria, incontinence, genital sores, muscle weakness, suspicious skin lesions, transient blindness, difficulty walking, depression, unusual weight change, abnormal bleeding, enlarged lymph nodes, angioedema, and breast masses. Positives: Weakness, dizziness, alcohol abuse  Past Medical History: Past Medical History:  Diagnosis Date   Diabetes mellitus    Erectile dysfunction    Hypertension    Hypogonadism male    Osteoarthritis    Past Surgical History:  Procedure Laterality Date   left knee replacement  02/13/2016   REPLACEMENT TOTAL KNEE Right      Medications: Prior to Admission medications   Medication Sig Start Date End Date Taking? Authorizing Provider  aspirin EC 81 MG tablet Take 81 mg by mouth daily.    [provider]  atorvastatin (LIPITOR) 20 MG tablet Take 10 mg by mouth daily.    [provider]  feeding supplement (ENSURE ENLIVE / ENSURE PLUS) LIQD Take 237 mLs by mouth 2 (two) times daily between meals. 09/02/22   Martyn Malay, MD  fluticasone (FLONASE) 50 MCG/ACT nasal spray Place 1 spray into both nostrils 2 (two) times daily as needed for allergies or rhinitis. 09/11/20   Garnet Sierras, DO  folic acid (FOLVITE) 1 MG tablet Take 1 tablet (1 mg total) by mouth daily. 08/31/22   Alcus Dad, MD  naloxone [facility]) nasal spray 4 mg/0.1 mL Place 1 spray into the nose once. 01/12/22   [provider]  tamsulosin (FLOMAX) 0.4 MG CAPS capsule Take 0.4 mg by mouth. Patient not taking: Reported on 08/24/2022    [provider]    Allergies:   Allergies  Allergen Reactions   Trospium     Other Reaction(s): Retention of urine   Alfuzosin Anxiety    Social History:  reports that he has never smoked. He has never used smokeless tobacco. He reports current alcohol use of about 14.0 standard drinks of alcohol per week. He reports current drug use. Drug: Marijuana.  Family History: Family History  Problem Relation Age of Onset   Diabetes Mother    Hypertension Father    Diabetes Father    Diabetes Brother     Physical Exam: Vitals:  12/03/22 1700 12/03/22 1830 12/03/22 1833 12/03/22 1930  BP: 136/84 132/83  138/87  Pulse: 89 88  86  Resp: 20 19  17   Temp:   98.2 F (36.8 C)   TempSrc:   Oral   SpO2: 100% 100%  100%    General:  Alert and oriented times three, well developed and nourished, no acute distress Eyes: PERRLA, pink conjunctiva, no scleral icterus ENT: Moist oral mucosa, neck supple, no thyromegaly, poor dentition Lungs: clear to ascultation, no wheeze, no  crackles, no use of accessory muscles Cardiovascular: regular rate and rhythm, no regurgitation, no gallops, no murmurs. No carotid bruits, no JVD Abdomen: soft, positive BS, non-tender, non-distended, no organomegaly, not an acute abdomen GU: not examined Neuro: CN II - XII grossly intact, sensation intact Musculoskeletal: strength 5/5 all extremities, no clubbing, cyanosis or edema Skin: no rash, no subcutaneous crepitation, no decubitus Psych: appropriate patient   Labs on Admission:  Recent Labs    12/03/22 1515 12/03/22 1821  NA 139 133*  K 5.2* 5.1  CL 104 101  CO2 10* 16*  GLUCOSE 111* 257*  BUN 27* 28*  CREATININE 1.93* 1.89*  CALCIUM 9.5 8.8*   Recent Labs    12/03/22 1515  AST 34  ALT 20  ALKPHOS 55  BILITOT 0.6  PROT 7.9  ALBUMIN 3.8    Recent Labs    12/03/22 1515  WBC 14.2*  NEUTROABS 13.2*  HGB 12.4*  HCT 39.8  MCV 105.9*  PLT 255    Radiological Exams on Admission: No results found.  Assessment/Plan Present on Admission:  Alcohol ketoacidosis -Improving with IV fluid hydration.  CO2 16 on repeat lab after 2L IV fluid hydration.  -Continue normal saline.   Chronic kidney disease stage IIIb/hyperkalemia -Metabolic acidosis from CKD may also be playing a part in patient's low CO2.  -Potassium improved post IV fluids on follow-up labs.  Repeat potassium 5.1 -Continue IV fluid hydration -BMP in a.m.   Hypoglycemia (in the field)/weakness -Multifactorial, likely due to patient's poor p.o. intake and alcohol lifestyle. -Sliding scale insulin/hypoglycemia protocol initiated   Alcohol withdrawal -Per patient he does not withdraw, however, CIWA protocol initiated -Thiamine, folate, vitamin B12, multivitamins   Dysphagia -Due to poor dentition.  Dysphagia 1 diet   Dyslipidemia -Lipitor resumed   Vitamin B12 deficiency/ Megaloblastic anemia -Vitamin B12 shot x 1 given  Dillyn Menna 12/03/2022, 8:51 PM in the fieldin the field)

## 2022-12-03 NOTE — ED Triage Notes (Addendum)
Was standing in living room when he became dizzy and fell. Denies head injury. 41 cbg on arrival for medics. Able to eat a sandwich at home, cbg 88. Recently had teeth removal, now unable to eat too much but still taking metformin

## 2022-12-03 NOTE — ED Provider Notes (Signed)
[facility] Provider Note   CSN: [id number] Arrival date & time: 12/03/22  1415     History  Chief Complaint  Patient presents with   Hypoglycemia   Fall    John Fisher is a 67 y.o. male.  Patient with history of T2DM, HTN, alcohol abuse presents today with complaints of syncope and hypoglycemia. Patient has been seen twice with similar complaints and has required admission previously. He states that for the past 1-2 years he has had similar issues. He has had several teeth extracted during this time and has had difficulty eating due to this. He states that soft foods do not taste good to him and so he does not eat. He states that today he stood up too quickly and got dizzy and had a syncopal event. He states that he only fell against a wall during this episode and therefore did not injure himself. EMS was called to the scene and found his blood sugar to be in the 40s. They were able to give him orange juice with improvement. He states that this has happened several times over the past 1-2 years as well. His last admission for this was 12/11 and he did stop drinking alcohol during this admission and into his time in rehab afterwards, however when he went back home he started drinking again. His last drink was yesterday and he does feel like he is starting to go into withdrawal. He denies any pain, fevers, chills, headache, vision changes, chest pain, shortness of breath, nausea, vomiting, or abdominal pain.  Denies hematuria or dysuria.  The history is provided by the patient. No language interpreter was used.  Hypoglycemia Fall       Home Medications Prior to Admission medications   Medication Sig Start Date End Date Taking? Authorizing Provider  aspirin EC 81 MG tablet Take 81 mg by mouth daily.    [provider]  atorvastatin (LIPITOR) 20 MG tablet Take 10 mg by mouth daily.    [provider]  feeding supplement  (ENSURE ENLIVE / ENSURE PLUS) LIQD Take 237 mLs by mouth 2 (two) times daily between meals. 09/02/22   Martyn Malay, MD  fluticasone (FLONASE) 50 MCG/ACT nasal spray Place 1 spray into both nostrils 2 (two) times daily as needed for allergies or rhinitis. 09/11/20   Garnet Sierras, DO  folic acid (FOLVITE) 1 MG tablet Take 1 tablet (1 mg total) by mouth daily. 08/31/22   Alcus Dad, MD  naloxone [facility]) nasal spray 4 mg/0.1 mL Place 1 spray into the nose once. 01/12/22   [provider]  tamsulosin (FLOMAX) 0.4 MG CAPS capsule Take 0.4 mg by mouth. Patient not taking: Reported on 08/24/2022    [provider]      Allergies    Trospium and Alfuzosin    Review of Systems   Review of Systems  Neurological:  Positive for syncope.  All other systems reviewed and are negative.   Physical Exam Updated Vital Signs BP 138/87   Pulse 86   Temp 98.2 F (36.8 C) (Oral)   Resp 17   SpO2 100%  Physical Exam Vitals and nursing note reviewed.  Constitutional:      General: He is not in acute distress.    Appearance: Normal appearance. He is normal weight. He is not ill-appearing, toxic-appearing or diaphoretic.  HENT:     Head: Normocephalic and atraumatic.     Comments: No Battle sign  or raccoon eyes Eyes:     Extraocular Movements: Extraocular movements intact.     Pupils: Pupils are equal, round, and reactive to light.  Neck:     Comments: No tenderness to palpation of cervical spine Cardiovascular:     Rate and Rhythm: Normal rate and regular rhythm.     Heart sounds: Normal heart sounds.  Pulmonary:     Effort: Pulmonary effort is normal. No respiratory distress.     Breath sounds: Normal breath sounds.  Abdominal:     General: Abdomen is flat.     Palpations: Abdomen is soft.  Musculoskeletal:        General: No tenderness. Normal range of motion.     Cervical back: Normal range of motion and neck supple.  Skin:    General: Skin is warm and dry.   Neurological:     General: No focal deficit present.     Mental Status: He is alert.  Psychiatric:        Mood and Affect: Mood normal.        Behavior: Behavior normal.    ED Results / Procedures / Treatments   Labs (all labs ordered are listed, but only abnormal results are displayed) Labs Reviewed  CBC WITH DIFFERENTIAL/PLATELET - Abnormal; Notable for the following components:      Result Value   WBC 14.2 (*)    RBC 3.76 (*)    Hemoglobin 12.4 (*)    MCV 105.9 (*)    Neutro Abs 13.2 (*)    Lymphs Abs 0.5 (*)    Abs Immature Granulocytes 0.09 (*)    All other components within normal limits  COMPREHENSIVE METABOLIC PANEL - Abnormal; Notable for the following components:   Potassium 5.2 (*)    CO2 10 (*)    Glucose, Bld 111 (*)    BUN 27 (*)    Creatinine, Ser 1.93 (*)    GFR, Estimated 37 (*)    Anion gap 25 (*)    All other components within normal limits  URINALYSIS, ROUTINE W REFLEX MICROSCOPIC - Abnormal; Notable for the following components:   Ketones, ur 80 (*)    Protein, ur 30 (*)    Bacteria, UA RARE (*)    All other components within normal limits  SALICYLATE LEVEL - Abnormal; Notable for the following components:   Salicylate Lvl [id number] (*)    All other components within normal limits  ACETAMINOPHEN LEVEL - Abnormal; Notable for the following components:   Acetaminophen (Tylenol), Serum <10 (*)    All other components within normal limits  BASIC METABOLIC PANEL - Abnormal; Notable for the following components:   Sodium 133 (*)    CO2 16 (*)    Glucose, Bld 257 (*)    BUN 28 (*)    Creatinine, Ser 1.89 (*)    Calcium 8.8 (*)    GFR, Estimated 38 (*)    Anion gap 16 (*)    All other components within normal limits  BLOOD GAS, VENOUS - Abnormal; Notable for the following components:   pCO2, Ven 32 (*)    Bicarbonate 17.7 (*)    Acid-base deficit 6.8 (*)    All other components within normal limits  CBG MONITORING, ED - Abnormal; Notable for the  following components:   Glucose-Capillary 266 (*)    All other components within normal limits  ETHANOL  RAPID URINE DRUG SCREEN, HOSP PERFORMED  CBG MONITORING, ED  CBG MONITORING, ED    EKG  EKG Interpretation  Date/Time:  Thursday December 03 2022 15:28:29 EDT Ventricular Rate:  89 PR Interval:  173 QRS Duration: 117 QT Interval:  350 QTC Calculation: 426 R Axis:   -9 Text Interpretation: Sinus rhythm Nonspecific intraventricular conduction delay No significant change since last tracing Confirmed by Wandra Arthurs [phone]) on 12/03/2022 6:04:27 PM  Radiology No results found.  Procedures .Critical Care  Performed by: Bud Face, PA-C Authorized by: Bud Face, PA-C   Critical care provider statement:    Critical care time (minutes):  30   Critical care start time:  12/03/2022 8:00 PM   Critical care end time:  12/03/2022 8:30 PM   Critical care was necessary to treat or prevent imminent or life-threatening deterioration of the following conditions:  Dehydration and metabolic crisis   Critical care was time spent personally by me on the following activities:  Development of treatment plan with patient or surrogate, discussions with consultants, discussions with primary provider, evaluation of patient's response to treatment, examination of patient, obtaining history from patient or surrogate, ordering and review of laboratory studies, pulse oximetry, re-evaluation of patient's condition and review of old charts   Care discussed with: admitting provider       Medications Ordered in ED Medications  sodium chloride 0.9 % bolus 1,000 mL (has no administration in time range)  LORazepam (ATIVAN) tablet 1-4 mg (has no administration in time range)    Or  LORazepam (ATIVAN) injection 1-4 mg (has no administration in time range)  thiamine (VITAMIN B1) tablet 100 mg (has no administration in time range)    Or  thiamine (VITAMIN B1) injection 100 mg (has no administration in time  range)  folic acid (FOLVITE) tablet 1 mg (has no administration in time range)  multivitamin with minerals tablet 1 tablet (has no administration in time range)  LORazepam (ATIVAN) injection 0-4 mg (has no administration in time range)    Followed by  LORazepam (ATIVAN) injection 0-4 mg (has no administration in time range)  sodium chloride 0.9 % bolus 1,000 mL (1,000 mLs Intravenous New Bag/Given 12/03/22 1829)    ED Course/ Medical Decision Making/ A&P                             Medical Decision Making Amount and/or Complexity of Data Reviewed Labs: ordered.  Risk OTC drugs. Prescription drug management. Decision regarding hospitalization.   This patient is a 67 y.o. male who presents to the ED for concern of syncope, hypoglycemia, this involves an extensive number of treatment options, and is a complaint that carries with it a high risk of complications and morbidity. The emergent differential diagnosis prior to evaluation includes, but is not limited to,  orthostatic syncope, cardiogenic syncope, malnutrition, alcohol withdrawal, sepsis   This is not an exhaustive differential.   Past Medical History / Co-morbidities / Social History: history of T2DM, HTN, alcohol abuse  Additional history: Chart reviewed. Pertinent results include: patient with 2 admissions over the past year for similar symptoms  Physical Exam: Physical exam performed. The pertinent findings include: no abdominal tenderness  Lab Tests: I ordered, and personally interpreted labs.  The pertinent results include:  WBC 14.2, hgb 12.4, K 5.2, bicarb 10, BUN 27, creatinine 1.93, anion gap 25 -->16. UA with ketonuira, proteinuria   Cardiac Monitoring:  The patient was maintained on a cardiac monitor.  My attending physician Dr. Darl Householder viewed and interpreted the cardiac monitored which  showed an underlying rhythm of: sinus rhythm, no STEMI. I agree with this interpretation.   Medications: I ordered medication  including fluids  for dehydration, ativan for withdrawal, thiamine and folic acid for alcohol abuse. Reevaluation of the patient after these medicines showed that the patient improved. I have reviewed the patients home medicines and have made adjustments as needed.   Disposition:  Patient presents today with complaints of syncope and hypoglycemia. Symptoms likely due to patients dehydration from decreased oral intake. Patient has required admission for same previously. He is also notably acidotic with anion gap 25 likely from alcohol ketoacidosis. He is also showing signs of alcohol withdrawal and is notably unsteady with ambulation. Given this, will require admission. He is understanding and amenable with plan.  Discussed patient with hospitalist who agrees to admit.   This is a shared visit with supervising physician Dr. Darl Householder who has independently evaluated patient & provided guidance in evaluation/management/disposition, in agreement with care    Final Clinical Impression(s) / ED Diagnoses Final diagnoses:  Alcoholic ketoacidosis  Fall, initial encounter  Alcohol withdrawal syndrome without complication [facility])    Rx / DC Orders ED Discharge Orders     None         Nestor Lewandowsky 12/03/22 2110    Drenda Freeze, MD 12/03/22 2259

## 2022-12-04 DIAGNOSIS — R1310 Dysphagia, unspecified: Secondary | ICD-10-CM | POA: Diagnosis present

## 2022-12-04 DIAGNOSIS — R5381 Other malaise: Secondary | ICD-10-CM | POA: Diagnosis present

## 2022-12-04 DIAGNOSIS — E11649 Type 2 diabetes mellitus with hypoglycemia without coma: Secondary | ICD-10-CM | POA: Diagnosis present

## 2022-12-04 DIAGNOSIS — D531 Other megaloblastic anemias, not elsewhere classified: Secondary | ICD-10-CM | POA: Diagnosis present

## 2022-12-04 DIAGNOSIS — F10130 Alcohol abuse with withdrawal, uncomplicated: Secondary | ICD-10-CM | POA: Diagnosis present

## 2022-12-04 DIAGNOSIS — Z7984 Long term (current) use of oral hypoglycemic drugs: Secondary | ICD-10-CM | POA: Diagnosis not present

## 2022-12-04 DIAGNOSIS — E875 Hyperkalemia: Secondary | ICD-10-CM | POA: Diagnosis present

## 2022-12-04 DIAGNOSIS — F10930 Alcohol use, unspecified with withdrawal, uncomplicated: Secondary | ICD-10-CM

## 2022-12-04 DIAGNOSIS — E8729 Other acidosis: Secondary | ICD-10-CM

## 2022-12-04 DIAGNOSIS — E162 Hypoglycemia, unspecified: Secondary | ICD-10-CM | POA: Diagnosis present

## 2022-12-04 DIAGNOSIS — Z8249 Family history of ischemic heart disease and other diseases of the circulatory system: Secondary | ICD-10-CM | POA: Diagnosis not present

## 2022-12-04 DIAGNOSIS — E538 Deficiency of other specified B group vitamins: Secondary | ICD-10-CM | POA: Diagnosis present

## 2022-12-04 DIAGNOSIS — E1122 Type 2 diabetes mellitus with diabetic chronic kidney disease: Secondary | ICD-10-CM | POA: Diagnosis present

## 2022-12-04 DIAGNOSIS — E785 Hyperlipidemia, unspecified: Secondary | ICD-10-CM | POA: Diagnosis present

## 2022-12-04 DIAGNOSIS — W19XXXA Unspecified fall, initial encounter: Secondary | ICD-10-CM | POA: Diagnosis present

## 2022-12-04 DIAGNOSIS — N179 Acute kidney failure, unspecified: Secondary | ICD-10-CM | POA: Diagnosis present

## 2022-12-04 DIAGNOSIS — Z7982 Long term (current) use of aspirin: Secondary | ICD-10-CM | POA: Diagnosis not present

## 2022-12-04 DIAGNOSIS — N1832 Chronic kidney disease, stage 3b: Secondary | ICD-10-CM | POA: Diagnosis present

## 2022-12-04 DIAGNOSIS — K029 Dental caries, unspecified: Secondary | ICD-10-CM | POA: Diagnosis present

## 2022-12-04 DIAGNOSIS — F10129 Alcohol abuse with intoxication, unspecified: Secondary | ICD-10-CM | POA: Diagnosis present

## 2022-12-04 DIAGNOSIS — Z833 Family history of diabetes mellitus: Secondary | ICD-10-CM | POA: Diagnosis not present

## 2022-12-04 DIAGNOSIS — I129 Hypertensive chronic kidney disease with stage 1 through stage 4 chronic kidney disease, or unspecified chronic kidney disease: Secondary | ICD-10-CM | POA: Diagnosis present

## 2022-12-04 DIAGNOSIS — Z79899 Other long term (current) drug therapy: Secondary | ICD-10-CM | POA: Diagnosis not present

## 2022-12-04 DIAGNOSIS — Z96653 Presence of artificial knee joint, bilateral: Secondary | ICD-10-CM | POA: Diagnosis present

## 2022-12-04 DIAGNOSIS — R296 Repeated falls: Secondary | ICD-10-CM | POA: Diagnosis present

## 2022-12-04 DIAGNOSIS — I951 Orthostatic hypotension: Secondary | ICD-10-CM | POA: Diagnosis present

## 2022-12-04 DIAGNOSIS — E1140 Type 2 diabetes mellitus with diabetic neuropathy, unspecified: Secondary | ICD-10-CM | POA: Diagnosis present

## 2022-12-04 LAB — OCCULT BLOOD X 1 CARD TO LAB, STOOL: Fecal Occult Bld: NEGATIVE

## 2022-12-04 LAB — GLUCOSE, CAPILLARY
Glucose-Capillary: 86 mg/dL (ref 70–99)
Glucose-Capillary: 77 mg/dL (ref 70–99)
Glucose-Capillary: 180 mg/dL — ABNORMAL HIGH (ref 70–99)
Glucose-Capillary: 166 mg/dL — ABNORMAL HIGH (ref 70–99)
Glucose-Capillary: 140 mg/dL — ABNORMAL HIGH (ref 70–99)

## 2022-12-04 LAB — CBC
HCT: 32 % — ABNORMAL LOW (ref 39.0–52.0)
Hemoglobin: 10.6 g/dL — ABNORMAL LOW (ref 13.0–17.0)
MCH: 32.7 pg (ref 26.0–34.0)
MCHC: 33.1 g/dL (ref 30.0–36.0)
MCV: 98.8 fL (ref 80.0–100.0)
Platelets: 209 10*3/uL (ref 150–400)
RBC: 3.24 MIL/uL — ABNORMAL LOW (ref 4.22–5.81)
RDW: 14.6 % (ref 11.5–15.5)
WBC: 9.3 10*3/uL (ref 4.0–10.5)
nRBC: 0 % (ref 0.0–0.2)

## 2022-12-04 LAB — FOLATE: Folate: 9.9 ng/mL (ref >5.9)

## 2022-12-04 LAB — RETICULOCYTES
Immature Retic Fract: 17.7 % — ABNORMAL HIGH (ref 2.3–15.9)
RBC.: 3.21 MIL/uL — ABNORMAL LOW (ref 4.22–5.81)
Retic Count, Absolute: 40.8 10*3/uL (ref 19.0–186.0)
Retic Ct Pct: 1.3 % (ref 0.4–3.1)

## 2022-12-04 LAB — IRON AND TIBC
Iron: 76 ug/dL (ref 45–182)
Saturation Ratios: 27 % (ref 17.9–39.5)
TIBC: 279 ug/dL (ref 250–450)
UIBC: 203 ug/dL

## 2022-12-04 LAB — VITAMIN B12: Vitamin B-12: >1500 pg/mL — ABNORMAL HIGH (ref 180–914)

## 2022-12-04 LAB — BASIC METABOLIC PANEL
Anion gap: 7 (ref 5–15)
BUN: 24 mg/dL — ABNORMAL HIGH (ref 8–23)
CO2: 23 mmol/L (ref 22–32)
Calcium: 8.4 mg/dL — ABNORMAL LOW (ref 8.9–10.3)
Chloride: 106 mmol/L (ref 98–111)
Creatinine, Ser: 1.55 mg/dL — ABNORMAL HIGH (ref 0.61–1.24)
GFR, Estimated: 49 mL/min — ABNORMAL LOW (ref >60)
Glucose, Bld: 100 mg/dL — ABNORMAL HIGH (ref 70–99)
Potassium: 4.5 mmol/L (ref 3.5–5.1)
Sodium: 136 mmol/L (ref 135–145)

## 2022-12-04 LAB — FERRITIN: Ferritin: 43 ng/mL (ref 24–336)

## 2022-12-04 MED ORDER — DIPHENHYDRAMINE HCL 50 MG/ML IJ SOLN
25.0000 mg | Freq: Four times a day (QID) | INTRAMUSCULAR | Status: DC | PRN
  Administered 2022-12-04: 25 mg via INTRAVENOUS
  Filled 2022-12-04: qty 1

## 2022-12-04 MED ORDER — ATORVASTATIN CALCIUM 10 MG PO TABS
10.0000 mg | ORAL_TABLET | Freq: Every day | ORAL | Status: DC
  Administered 2022-12-04 – 2022-12-05 (×2): 10 mg via ORAL
  Filled 2022-12-04 (×2): qty 1

## 2022-12-04 MED ORDER — IBUPROFEN 200 MG PO TABS
200.0000 mg | ORAL_TABLET | Freq: Once | ORAL | Status: AC
  Administered 2022-12-04: 200 mg via ORAL
  Filled 2022-12-04: qty 1

## 2022-12-04 MED ORDER — ENSURE ENLIVE PO LIQD
237.0000 mL | Freq: Two times a day (BID) | ORAL | Status: DC
  Administered 2022-12-04: 237 mL via ORAL

## 2022-12-04 MED ORDER — GABAPENTIN 100 MG PO CAPS
100.0000 mg | ORAL_CAPSULE | Freq: Two times a day (BID) | ORAL | Status: DC
  Administered 2022-12-04 – 2022-12-05 (×4): 100 mg via ORAL
  Filled 2022-12-04 (×4): qty 1

## 2022-12-04 MED ORDER — BOOST / RESOURCE BREEZE PO LIQD CUSTOM
1.0000 | ORAL | Status: DC
  Administered 2022-12-05: 1 via ORAL

## 2022-12-04 MED ORDER — HYDROMORPHONE HCL 1 MG/ML IJ SOLN
1.0000 mg | INTRAMUSCULAR | Status: DC | PRN
  Administered 2022-12-04 – 2022-12-05 (×5): 1 mg via INTRAVENOUS
  Filled 2022-12-04 (×5): qty 1

## 2022-12-04 MED ORDER — ENSURE ENLIVE PO LIQD
237.0000 mL | Freq: Two times a day (BID) | ORAL | Status: DC
  Administered 2022-12-04 – 2022-12-05 (×3): 237 mL via ORAL

## 2022-12-04 MED ORDER — SODIUM CHLORIDE 0.9 % IV SOLN: INTRAVENOUS | Status: DC

## 2022-12-04 MED ORDER — ASPIRIN 81 MG PO TBEC
81.0000 mg | DELAYED_RELEASE_TABLET | Freq: Every day | ORAL | Status: DC
  Administered 2022-12-04 – 2022-12-05 (×2): 81 mg via ORAL
  Filled 2022-12-04 (×2): qty 1

## 2022-12-04 MED ORDER — INSULIN ASPART 100 UNIT/ML IJ SOLN: 0.0000 [IU] | Freq: Three times a day (TID) | INTRAMUSCULAR | Status: DC

## 2022-12-04 MED ORDER — SODIUM CHLORIDE 0.9 % IV BOLUS
500.0000 mL | Freq: Once | INTRAVENOUS | Status: AC
  Administered 2022-12-04: 500 mL via INTRAVENOUS

## 2022-12-04 MED ORDER — CYANOCOBALAMIN 1000 MCG/ML IJ SOLN
1000.0000 ug | Freq: Once | INTRAMUSCULAR | Status: AC
  Administered 2022-12-04: 1000 ug via INTRAMUSCULAR
  Filled 2022-12-04: qty 1

## 2022-12-04 NOTE — Progress Notes (Addendum)
[facility]                                                                              John Fisher, is a 67 y.o. male, DOB - [birth year], [id number] Admit date - 12/03/2022    Outpatient Primary MD for the patient is [facility], [facility]  LOS - 0  days  Chief Complaint  Patient presents with   Hypoglycemia   Fall       Brief summary   Patient is a 67 year old male with diabetes mellitus, HLP, ongoing alcohol use, poor dentition leading to poor p.o. intake and hypoglycemia.  He attributes this to his poor dentition, hence poor p.o. intake contributing to the weight loss and dizziness.  He also has ongoing alcohol use which has resulted in recurrent hypoglycemia and syncopal episodes.  On the day of admission he had a hard time getting out of the bed without being dizzy but did not have a syncopal event. In ED, temp 98.7 F, pulse 93, BP 122/74, RR 18.  Orthostatic in ED with a standing BP 95/79 Labs showed sodium 139, potassium 5.2, CO2 10, BUN 27, creatinine 1.93 Creatinine was 1.65 on 08/25/2022 Hb 12.4 WBC is 14.2, MCV 105.9.  Patient was admitted for further workup   Assessment & Plan    Principal Problem: Orthostatic hypotension, acute kidney injury -In ED, noted to be orthostatic and hypotensive -Continue IV fluid hydration -Creatinine 1.93 on admission, improving to 1.5 -Hold amlodipine  Active Problems: Alcoholic ketoacidosis, starvation ketoacidosis, hypoglycemia -Improving, CO2 23, -Initially placed on dysphagia 1 diet, however patient feels that he can tolerate mechanical soft diet. -Counseled on alcohol cessation -Per patient he has dental appointment at [facility] in July and needs dental implants, he does not want to dentures.  Recommended to continue soft diet until his dental implants -Dietitian consult for nutritional supplements  Diabetes mellitus, type II, NIDDM -Patient reports that he is on metformin and another p.o. medication, not  listed in [facility] PTA meds  -For now placed on sliding scale insulin, sensitive -Hemoglobin A1c 5.2 on 08/24/2022   Neuropathy -Patient reports neuropathy in her feet, placed on gabapentin 100 mg twice daily    Hypertension -Currently borderline BP, hold amlodipine, continue IVF    Dyslipidemia -Continue statin     Alcohol withdrawal ([place]) -Counseled strongly on alcohol cessation, continue CIWA protocol with Ativan, thiamine, folate -Currently stable, not in any acute withdrawals  Fall, generalized debility -PT OT evaluation   Estimated body mass index is 23.05 kg/m as calculated from the following:   Height as of this encounter: 6' (1.829 m).   Weight as of this encounter: 77.1 kg.  Code Status:  DVT Prophylaxis:  enoxaparin (LOVENOX) injection 40 mg Start: 12/03/22 2200   Level of Care: Level of care: Telemetry Family Communication: Updated patient Disposition Plan:      Remains inpatient appropriate: Hopefully DC home in a.m. if improving  Procedures:    Consultants:     Antimicrobials: None   Medications  aspirin EC  81 mg Oral Daily   atorvastatin  10 mg Oral Daily   enoxaparin (LOVENOX) injection  40  mg Subcutaneous Q24H   feeding supplement  237 mL Oral BID BM   folic acid  1 mg Oral Daily   insulin aspart  0-9 Units Subcutaneous TID WC   LORazepam  0-4 mg Intravenous Q6H   Followed by   Derrill Memo ON 12/05/2022] LORazepam  0-4 mg Intravenous Q12H   multivitamin with minerals  1 tablet Oral Daily   thiamine  100 mg Oral Daily   Or   thiamine  100 mg Intravenous Daily      Subjective:   John Fisher was seen and examined today.  Does not like dysphagia 1 diet, states he can eat soft diet.  Currently no acute nausea vomiting, dizziness, lightheadedness.  No chest pain or shortness of breath or fevers.  Not in any acute alcohol withdrawals.  Objective:   Vitals:   12/03/22 2300 12/04/22 0246 12/04/22 0549 12/04/22 1015  BP:  138/82 (!) 147/95 (!)  138/92  Pulse:  84 85 83  Resp:   16 16  Temp:  98 F (36.7 C) 98.2 F (36.8 C) 98.7 F (37.1 C)  TempSrc:  Oral Oral Oral  SpO2:  100% 100% 100%  Weight: 77.1 kg     Height: 6' (1.829 m)       Intake/Output Summary (Last 24 hours) at 12/04/2022 1102 Last data filed at 12/04/2022 0940 Gross per 24 hour  Intake 3186.67 ml  Output 400 ml  Net 2786.67 ml     Wt Readings from Last 3 Encounters:  12/03/22 77.1 kg  08/24/22 77.1 kg  02/17/22 81.2 kg     Exam General: Alert and oriented x 3, NAD HEENT: poor dentition Cardiovascular: S1 S2 auscultated,  RRR Respiratory: Clear to auscultation bilaterally, no wheezing Gastrointestinal: Soft, nontender, nondistended, + bowel sounds Ext: no pedal edema bilaterally Neuro: no new FND's Psych: Normal affect    Data Reviewed:  I have personally reviewed following labs    CBC Lab Results  Component Value Date   WBC 9.3 12/04/2022   RBC 3.21 (L) 12/04/2022   HGB 10.6 (L) 12/04/2022   HCT 32.0 (L) 12/04/2022   MCV 98.8 12/04/2022   MCH 32.7 12/04/2022   PLT 209 12/04/2022   MCHC 33.1 12/04/2022   RDW 14.6 12/04/2022   LYMPHSABS 0.5 (L) 12/03/2022   MONOABS 0.4 12/03/2022   EOSABS 0.0 12/03/2022   BASOSABS 0.0 [id number]     Last metabolic panel Lab Results  Component Value Date   NA 136 12/04/2022   K 4.5 12/04/2022   CL 106 12/04/2022   CO2 23 12/04/2022   BUN 24 (H) 12/04/2022   CREATININE 1.55 (H) 12/04/2022   GLUCOSE 100 (H) 12/04/2022   GFRNONAA 49 (L) 12/04/2022   GFRAA >60 06/10/2018   CALCIUM 8.4 (L) 12/04/2022   PHOS 5.3 (H) 12/03/2022   PROT 7.9 12/03/2022   ALBUMIN 3.8 12/03/2022   BILITOT 0.6 12/03/2022   ALKPHOS 55 12/03/2022   AST 34 12/03/2022   ALT 20 12/03/2022   ANIONGAP 7 12/04/2022    CBG (last 3)  Recent Labs    12/03/22 2226 12/04/22 0327 12/04/22 0741  GLUCAP 112* 86 77      Coagulation Profile: No results for input(s): "INR", "PROTIME" in the last 168  hours.   Radiology Studies: I have personally reviewed the imaging studies  No results found.     Estill Cotta M.D. [facility] 12/04/2022, 11:02 AM  Available via Epic secure chat 7am-7pm After 7 pm, please refer to night  coverage provider listed on amion.

## 2022-12-04 NOTE — Progress Notes (Signed)
Entered patients room this AM for routine care; this nurse noted the smell of THC in the patient's room. This nurse inquired whether the patient had a vape/any medications or drugs in the room and that for safety purposes we do not allow for these items to be used in the hospital to ensure there is no interaction with current medical care. The patient denied having any substances of this sort in his room. This nurse inspected the best of my ability the patient's bedding. Patient educated and NT informed to observe for anything in the patient's bedding as well.

## 2022-12-04 NOTE — Progress Notes (Signed)
Patient has refused insulin all day. Patient was educated on the necessity to control his blood sugars. Patient gave his verbal understanding of this but would not like to receive insulin. Provider notified.

## 2022-12-04 NOTE — Inpatient Diabetes Management (Signed)
Inpatient Diabetes Program Recommendations  AACE/ADA: New Consensus Statement on Inpatient Glycemic Control (2015)  Target Ranges:  Prepandial:   less than 140 mg/dL      Peak postprandial:   less than 180 mg/dL (1-2 hours)      Critically ill patients:  140 - 180 mg/dL   Lab Results  Component Value Date   GLUCAP 77 12/04/2022   HGBA1C 5.2 08/24/2022    Review of Glycemic Control  Latest Reference Range & Units 12/03/22 14:43 12/03/22 18:08 12/03/22 22:26 12/04/22 03:27 12/04/22 07:41  Glucose-Capillary 70 - 99 mg/dL 94 266 (H) 112 (H) 86 77   Diabetes history: DM 2 Outpatient Diabetes medications: None Current orders for Inpatient glycemic control:  Novolog 0-15 units Q4 hours  Inpatient Diabetes Program Recommendations:    Elevated renal function, lower glucose trends while here. Diet ordered. No insulin given for glucose 266 last night.   -  reduce Novolog Correction scale to 0-9 units tid + hs scale.  Thanks,  Tama Headings RN, MSN, BC-ADM Inpatient Diabetes Coordinator Team Pager [phone] (8a-5p)

## 2022-12-04 NOTE — Plan of Care (Signed)

## 2022-12-04 NOTE — Evaluation (Signed)
Physical Therapy Evaluation Patient Details Name: John Fisher MRN: [id number] DOB: Oct 01, 1955 Today's Date: 12/04/2022  History of Present Illness  Patient is a 67 year old male with diabetes mellitus, HLP, ongoing alcohol use, poor dentition leading to poor p.o. intake and hypoglycemia.  Clinical Impression  Pt admitted with above diagnosis.  Pt currently with functional limitations due to the deficits listed below (see PT Problem List). Pt will benefit from acute skilled PT to increase their independence and safety with mobility to allow discharge.     The patient is mobilizing  well, mildly imbalanced.  Ambulated with and without Rw and no balance loss. Patient  reports plans to return to home.      Recommendations for follow up therapy are one component of a multi-disciplinary discharge planning process, led by the attending physician.  Recommendations may be updated based on patient status, additional functional criteria and insurance authorization.  Follow Up Recommendations No PT follow up      Assistance Recommended at Discharge PRN  Patient can return home with the following  Help with stairs or ramp for entrance;Assistance with cooking/housework;Assist for transportation    Equipment Recommendations None recommended by PT  Recommendations for Other Services       Functional Status Assessment Patient has had a recent decline in their functional status and demonstrates the ability to make significant improvements in function in a reasonable and predictable amount of time.     Precautions / Restrictions Precautions Precautions: Fall Restrictions Weight Bearing Restrictions: No      Mobility  Bed Mobility Overal bed mobility: Independent                  Transfers Overall transfer level: Modified independent Equipment used: Rolling walker (2 wheels)                    Ambulation/Gait Ambulation/Gait assistance: Supervision Gait Distance (Feet):  100 Feet Assistive device: Rolling walker (2 wheels) Gait Pattern/deviations: Step-through pattern   Gait velocity interpretation: <1.31 ft/sec, indicative of household ambulator   General Gait Details: 62' with no device  Stairs            Wheelchair Mobility    Modified Rankin (Stroke Patients Only)       Balance Overall balance assessment: Mild deficits observed, not formally tested                                           Pertinent Vitals/Pain Pain Assessment Pain Assessment: Faces Faces Pain Scale: Hurts whole lot Pain Location: right side of face(states jaw brken in a fall) Pain Descriptors / Indicators: Aching, Discomfort, Pressure Pain Intervention(s): Patient requesting pain meds-RN notified, Monitored during session, Ice applied    Home Living Family/patient expects to be discharged to:: Private residence Living Arrangements: Alone;Children Available Help at Discharge: Available PRN/intermittently Type of Home: Apartment         Home Layout: One level Home Equipment: Cane - single point      Prior Function Prior Level of Function : Independent/Modified Independent;Driving             Mobility Comments: reports  gets out, daughter can get food       Hand Dominance   Dominant Hand: Right    Extremity/Trunk Assessment   Upper Extremity Assessment Upper Extremity Assessment: Overall WFL for tasks assessed    Lower Extremity  Assessment Lower Extremity Assessment: Overall WFL for tasks assessed (reports feet tingling)    Cervical / Trunk Assessment Cervical / Trunk Assessment: Normal  Communication   Communication: No difficulties  Cognition Arousal/Alertness: Awake/alert Behavior During Therapy: WFL for tasks assessed/performed Overall Cognitive Status: Impaired/Different from baseline Area of Impairment: Orientation                 Orientation Level: Time                      General  Comments      Exercises     Assessment/Plan    PT Assessment Patient needs continued PT services  PT Problem List Decreased mobility;Decreased safety awareness;Decreased coordination;Decreased balance       PT Treatment Interventions Therapeutic activities;Gait training;Therapeutic exercise;Patient/family education;Functional mobility training;Balance training    PT Goals (Current goals can be found in the Care Plan section)  Acute Rehab PT Goals Patient Stated Goal: go home PT Goal Formulation: With patient Time For Goal Achievement: 12/18/22 Potential to Achieve Goals: Good    Frequency Min 3X/week     Co-evaluation               AM-PAC PT "6 Clicks" Mobility  Outcome Measure Help needed turning from your back to your side while in a flat bed without using bedrails?: None Help needed moving from lying on your back to sitting on the side of a flat bed without using bedrails?: None Help needed moving to and from a bed to a chair (including a wheelchair)?: None Help needed standing up from a chair using your arms (e.g., wheelchair or bedside chair)?: A Little Help needed to walk in hospital room?: A Little Help needed climbing 3-5 steps with a railing? : A Lot 6 Click Score: 20    End of Session Equipment Utilized During Treatment: Gait belt Activity Tolerance: Patient tolerated treatment well Patient left: in bed;with bed alarm set Nurse Communication: Mobility status PT Visit Diagnosis: Muscle weakness (generalized) (M62.81)    Time: [id number] PT Time Calculation (min) (ACUTE ONLY): 15 min   Charges:   PT Evaluation $PT Eval Low Complexity: 1 Low          [place] Office [phone] Weekend [phone]   Claretha Cooper 12/04/2022, 4:31 PM

## 2022-12-04 NOTE — Progress Notes (Signed)
Initial Nutrition Assessment  DOCUMENTATION CODES:   Non-severe (moderate) malnutrition in context of chronic illness  INTERVENTION:   -Ensure Plus High Protein po BID, each supplement provides 350 kcal and 20 grams of protein.   -Boost Breeze po daily in AM, each supplement provides 250 kcal and 9 grams of protein   -Magic cup TID with meals, each supplement provides 290 kcal and 9 grams of protein   -Added "Alcohol Misuse and Nutrition" handout to AVS  -Consider bowel regimen if constipation continues  -Needs updated weight for admission, ordered.  NUTRITION DIAGNOSIS:   Moderate Malnutrition related to dysphagia, chronic illness (chewing difficulty r/t teeth removal) as evidenced by energy intake < or equal to 75% for > or equal to 1 month, moderate fat depletion, moderate muscle depletion.   GOAL:   Patient will meet greater than or equal to 90% of their needs  MONITOR:   PO intake, Supplement acceptance, Labs, Weight trends, I & O's  REASON FOR ASSESSMENT:   Consult Assessment of nutrition requirement/status  ASSESSMENT:   67 year old male with diabetes mellitus, HLP, ongoing alcohol use, poor dentition leading to poor p.o. intake and hypoglycemia.  He attributes this to his poor dentition, hence poor p.o. intake contributing to the weight loss and dizziness.  He also has ongoing alcohol use which has resulted in recurrent hypoglycemia and syncopal episodes.  Patient in room, states he has had dental issues for 2.5 years now. His teeth have slowly been coming out and he was finally approved for dental implants but not until July this year. Pt has had difficulty chewing and swallowing. Consumed 50% of his breakfast this morning. He drank all of an Ensure. Reports he has had some constipation issues but agrees this may be relate more to his lack of nutrition and food intake. Pt has also been drinking alcohol and smoking THC.  Pt willing to continue Ensure for additional  kcals and protein. Pt willing to try Boost Breeze and Magic cups for protein alternatives. Recommend bowel regimen if BM does not occur.  Pt reports he weighed ~220 lbs 2.5 years ago.  Current weight seems to be copied over from weight recorded on 08/24/22. Needs new weight for admission.  Medications: Folic acid, Multivitamin with minerals daily, Thiamine  Labs reviewed: CBGs: 77-180    NUTRITION - FOCUSED PHYSICAL EXAM:  Flowsheet Row Most Recent Value  Orbital Region Moderate depletion  Upper Arm Region Mild depletion  Thoracic and Lumbar Region Mild depletion  Buccal Region Moderate depletion  Temple Region Moderate depletion  Clavicle Bone Region Mild depletion  Clavicle and Acromion Bone Region Mild depletion  Scapular Bone Region Mild depletion  Dorsal Hand Mild depletion  Patellar Region Moderate depletion  Anterior Thigh Region Moderate depletion  Posterior Calf Region Moderate depletion  Edema (RD Assessment) None  Hair Reviewed  Eyes Reviewed  Mouth Reviewed  [poor dentition, s/p teeth removal]  Skin Reviewed  Nails Reviewed       Diet Order:   Diet Order             DIET DYS 3 Room service appropriate? Yes; Fluid consistency: Thin  Diet effective now                   EDUCATION NEEDS:   Education needs have been addressed  Skin:  Skin Assessment: Reviewed RN Assessment  Last BM:  PTA  Height:   Ht Readings from Last 1 Encounters:  12/03/22 6' (1.829 m)  Weight:   Wt Readings from Last 1 Encounters:  12/03/22 77.1 kg   BMI:  Body mass index is 23.05 kg/m.  Estimated Nutritional Needs:   Kcal:  2200-2400  Protein:  100-115g  Fluid:  2.2L/day  Clayton Bibles, MS, RD, LDN Inpatient Clinical Dietitian Contact information available via Amion

## 2022-12-04 NOTE — TOC Initial Note (Signed)
Transition of Care [facility]) - Initial/Assessment Note    Patient Details  Name: John Fisher MRN: [id number] Date of Birth: 01-30-1956  Transition of [facility]) CM/SW Contact:    Vassie Moselle, LCSW Phone Number: 12/04/2022, 10:21 AM  Clinical Narrative:                 [facility] consulted for SA education/counseling. Met with pt at bedside to discuss current alcohol and THC use. Pt states he has a drink "every once in awhile before bed." Pt minimizes use and denies alcohol use causing issues in personal life or with physical health. Pt in pre-contemplation stage of change and has no interest in changing life style at this time. Pt declines resources being added to AVS.  CSW encouraged pt to reach out to SW if decides he would like resources.   Expected Discharge Plan: Home/Self Care Barriers to Discharge: No Barriers Identified   Patient Goals and CMS Choice Patient states their goals for this hospitalization and ongoing recovery are:: To return home CMS Medicare.gov Compare Post Acute Care list provided to:: Patient Choice offered to / list presented to : Patient [place] ownership interest in [facility].provided to::  (NA)    Expected Discharge Plan and Services In-house Referral: Clinical Social Work Discharge Planning Services: NA Post Acute Care Choice: NA Living arrangements for the past 2 months: Apartment                 DME Arranged: N/A DME Agency: NA                  Prior Living Arrangements/Services Living arrangements for the past 2 months: Apartment Lives with:: Self Patient language and need for interpreter reviewed:: Yes Do you feel safe going back to the place where you live?: Yes      Need for Family Participation in Patient Care: No (Comment) Care giver support system in place?: No (comment)   Criminal Activity/Legal Involvement Pertinent to Current Situation/Hospitalization: No - Comment as needed  Activities of Daily Living Home  Assistive Devices/Equipment: None ADL Screening (condition at time of admission) Patient's cognitive ability adequate to safely complete daily activities?: Yes Is the patient deaf or have difficulty hearing?: No Does the patient have difficulty seeing, even when wearing glasses/contacts?: No Does the patient have difficulty concentrating, remembering, or making decisions?: No Patient able to express need for assistance with ADLs?: Yes Does the patient have difficulty dressing or bathing?: No Independently performs ADLs?: Yes (appropriate for developmental age) Does the patient have difficulty walking or climbing stairs?: No Weakness of Legs: None Weakness of Arms/Hands: None  Permission Sought/Granted   Permission granted to share information with : No              Emotional Assessment Appearance:: Appears stated age Attitude/Demeanor/Rapport: Engaged, Avoidant, Guarded Affect (typically observed): Defensive, In denial Orientation: : Oriented to Self, Oriented to Place, Oriented to  Time, Oriented to Situation Alcohol / Substance Use: Not Applicable Psych Involvement: No (comment)  Admission diagnosis:  Alcoholic ketoacidosis [id number] Fall, initial encounter [W19.XXXA] Alcohol withdrawal syndrome without complication [facility]) 0000000 Patient Active Problem List   Diagnosis Date Noted   Fall 12/04/2022   Hypoglycemia 12/04/2022   Alcohol withdrawal ([place]) [id number]   Alcoholic ketoacidosis [id number]   Orthostatic syncope 08/27/2022   Encounter for dental examination 08/27/2022   [place] 08/27/2022   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   Accretions on teeth 08/27/2022   Teeth missing 08/27/2022  Weakness 08/26/2022   Malnutrition of moderate degree 08/25/2022   Poor dentition 08/25/2022   Diabetes mellitus ([place]) 08/24/2022   Dysphagia 08/24/2022   Perennial and seasonal allergic rhinitis 12/25/2019   BPH (benign prostatic hyperplasia) 08/07/2015   Syncope  08/07/2015   Syncope and collapse 08/07/2015   Dyslipidemia 05/18/2013   Shoulder pain, right 02/10/2011   Hypertension 02/10/2011   Insomnia 02/10/2011   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   HYPOGONADISM 08/19/2010   Vitamin B12 deficiency 08/19/2010   TESTICULAR PAIN 08/19/2010   FATIGUE 08/19/2010   SNORING 08/19/2010   SKIN RASH, ALLERGIC 01/08/2009   Diabetes mellitus type 2, uncontrolled ([place]) 12/11/2008   VITAMIN D DEFICIENCY 12/13/2007   ERECTILE DYSFUNCTION 12/13/2007   Osteoarthritis 12/13/2007   FOOT PAIN 12/13/2007   ABNORMAL GLUCOSE NEC 12/13/2007   PCP:  [facility], [place]:   [place], [place] [address] Phone: [phone] Fax: [phone]  [facility] ([place], [place] [facility] [address] [place] [address] Phone: [phone] Fax: [place], [place] [address] [address] Phone: [phone] Fax: [place], [place] - [place] [address] [place] [address] Phone: [phone] Fax: [phone]     Social Determinants of Health (SDOH) Social History: [place]: No Food Insecurity (12/04/2022)  Housing: Low Risk  (12/04/2022)  Transportation Needs: No Transportation Needs (12/04/2022)  Utilities: Not At Risk (12/04/2022)  Tobacco Use: Low Risk  (12/03/2022)   SDOH Interventions: Food Insecurity Interventions: Intervention Not Indicated Housing Interventions: Intervention Not Indicated Transportation Interventions: Intervention Not Indicated Utilities Interventions: Intervention Not Indicated   Readmission Risk Interventions     No data to display

## 2022-12-05 DIAGNOSIS — E8729 Other acidosis: Secondary | ICD-10-CM | POA: Diagnosis not present

## 2022-12-05 DIAGNOSIS — F10930 Alcohol use, unspecified with withdrawal, uncomplicated: Secondary | ICD-10-CM | POA: Diagnosis not present

## 2022-12-05 DIAGNOSIS — E162 Hypoglycemia, unspecified: Secondary | ICD-10-CM | POA: Diagnosis not present

## 2022-12-05 DIAGNOSIS — W19XXXA Unspecified fall, initial encounter: Secondary | ICD-10-CM | POA: Diagnosis not present

## 2022-12-05 LAB — BASIC METABOLIC PANEL
Anion gap: 9 (ref 5–15)
BUN: 16 mg/dL (ref 8–23)
CO2: 24 mmol/L (ref 22–32)
Calcium: 8.7 mg/dL — ABNORMAL LOW (ref 8.9–10.3)
Chloride: 104 mmol/L (ref 98–111)
Creatinine, Ser: 1.39 mg/dL — ABNORMAL HIGH (ref 0.61–1.24)
GFR, Estimated: 56 mL/min — ABNORMAL LOW (ref >60)
Glucose, Bld: 118 mg/dL — ABNORMAL HIGH (ref 70–99)
Potassium: 3.6 mmol/L (ref 3.5–5.1)
Sodium: 137 mmol/L (ref 135–145)

## 2022-12-05 LAB — CBC
HCT: 33.2 % — ABNORMAL LOW (ref 39.0–52.0)
Hemoglobin: 10.9 g/dL — ABNORMAL LOW (ref 13.0–17.0)
MCH: 32.2 pg (ref 26.0–34.0)
MCHC: 32.8 g/dL (ref 30.0–36.0)
MCV: 98.2 fL (ref 80.0–100.0)
Platelets: 203 10*3/uL (ref 150–400)
RBC: 3.38 MIL/uL — ABNORMAL LOW (ref 4.22–5.81)
RDW: 14.5 % (ref 11.5–15.5)
WBC: 7.3 10*3/uL (ref 4.0–10.5)
nRBC: 0 % (ref 0.0–0.2)

## 2022-12-05 LAB — GLUCOSE, CAPILLARY
Glucose-Capillary: 165 mg/dL — ABNORMAL HIGH (ref 70–99)
Glucose-Capillary: 137 mg/dL — ABNORMAL HIGH (ref 70–99)
Glucose-Capillary: 114 mg/dL — ABNORMAL HIGH (ref 70–99)
Glucose-Capillary: 124 mg/dL — ABNORMAL HIGH (ref 70–99)

## 2022-12-05 LAB — PHOSPHORUS: Phosphorus: 2.8 mg/dL (ref 2.5–4.6)

## 2022-12-05 LAB — MAGNESIUM: Magnesium: 1.8 mg/dL (ref 1.7–2.4)

## 2022-12-05 MED ORDER — METFORMIN HCL 500 MG PO TABS
500.0000 mg | ORAL_TABLET | Freq: Every day | ORAL | Status: DC
  Administered 2022-12-06: 500 mg via ORAL
  Filled 2022-12-05: qty 1

## 2022-12-05 MED ORDER — SODIUM CHLORIDE 0.9 % IV SOLN: INTRAVENOUS | Status: AC

## 2022-12-05 NOTE — Plan of Care (Signed)

## 2022-12-05 NOTE — Progress Notes (Signed)
Mobility Specialist - Progress Note   12/05/22 1216  Mobility  Activity Ambulated independently in hallway  Level of Assistance Independent  Assistive Device None  Distance Ambulated (ft) 500 ft  Activity Response Tolerated well  Mobility Referral Yes  $Mobility charge 1 Mobility   Pt received in bed and agreeable to mobility. No complaints during session. Pt to bed after session with all needs met. Bed alarm turned back on.   [facility]

## 2022-12-05 NOTE — Progress Notes (Addendum)
[facility]                                                                              John Fisher, is a 67 y.o. male, DOB - [birth year], [id number] Admit date - 12/03/2022    Outpatient Primary MD for the patient is [facility]  LOS - 1  days  Chief Complaint  Patient presents with   Hypoglycemia   Fall       Brief summary   Patient is a 67 year old male with diabetes mellitus, HLP, ongoing alcohol use, poor dentition leading to poor p.o. intake and hypoglycemia.  He attributes this to his poor dentition, hence poor p.o. intake contributing to the weight loss and dizziness.  He also has ongoing alcohol use which has resulted in recurrent hypoglycemia and syncopal episodes.  On the day of admission he had a hard time getting out of the bed without being dizzy but did not have a syncopal event. In ED, temp 98.7 F, pulse 93, BP 122/74, RR 18.  Orthostatic in ED with a standing BP 95/79 Labs showed sodium 139, potassium 5.2, CO2 10, BUN 27, creatinine 1.93 Creatinine was 1.65 on 08/25/2022 Hb 12.4 WBC is 14.2, MCV 105.9.  Patient was admitted for further workup   Assessment & Plan    Principal Problem: Orthostatic hypotension, acute kidney injury -In ED, noted to be orthostatic and hypotensive -Continue IV fluid hydration -Creatinine 1.93 on admission, improving to 1.5-> 1.39   Active Problems: Alcoholic ketoacidosis, starvation ketoacidosis, hypoglycemia -Initially placed on dysphagia 1 diet, patient now tolerating mechanical soft diet/dysphagia 3  -Per patient he has dental appointment at [facility] in July and needs dental implants, he does not want dentures.  Recommended to continue soft diet until his dental implants are placed -Continue Ensure, boost, nutritional supplements  Diabetes mellitus, type II, NIDDM -Hemoglobin A1c 5.2 on 08/24/2022 -Patient declines subcu insulin sliding scale, on metformin 500 mg daily, will  continue  Neuropathy -Reported neuropathy in feet, started on gabapentin 100 mg twice daily    Hypertension -BP now stable    Dyslipidemia -Continue statin     Alcohol withdrawal ([place]) -Counseled strongly on alcohol cessation, continue CIWA protocol with Ativan, thiamine, folate -Currently stable, not in any acute withdrawals  Fall, generalized debility -PT OT evaluation   Estimated body mass index is 25.52 kg/m as calculated from the following:   Height as of this encounter: 6' (1.829 m).   Weight as of this encounter: 85.4 kg.  Code Status:  DVT Prophylaxis:  enoxaparin (LOVENOX) injection 40 mg Start: 12/03/22 2200   Level of Care: Level of care: Telemetry Family Communication: Updated patient Disposition Plan:      Remains inpatient appropriate: states still feeling weak, wants to go home in a.m.   Procedures:   Consultants:    Antimicrobials: None   Medications  aspirin EC  81 mg Oral Daily   atorvastatin  10 mg Oral Daily   enoxaparin (LOVENOX) injection  40 mg Subcutaneous Q24H   feeding supplement  1 Container Oral Q24H   feeding supplement  237 mL Oral BID BM   folic acid  1 mg Oral Daily   gabapentin  100 mg Oral BID   insulin aspart  0-9 Units Subcutaneous TID WC   LORazepam  0-4 mg Intravenous Q6H   Followed by   LORazepam  0-4 mg Intravenous Q12H   multivitamin with minerals  1 tablet Oral Daily   thiamine  100 mg Oral Daily   Or   thiamine  100 mg Intravenous Daily      Subjective:   John Fisher was seen and examined today.  Tolerating soft diet, no acute nausea vomiting.  Dizziness much improved.  Not in any acute alcohol withdrawals  Objective:   Vitals:   12/04/22 1600 12/04/22 1908 12/05/22 0428 12/05/22 0900  BP:  (!) 139/92 (!) 140/98   Pulse:  88 81 81  Resp: (!) 21 18 16    Temp:  98.8 F (37.1 C) 97.9 F (36.6 C)   TempSrc:  Oral    SpO2:  98% 96%   Weight:      Height:        Intake/Output Summary (Last 24 hours)  at 12/05/2022 1123 Last data filed at 12/05/2022 1049 Gross per 24 hour  Intake 4514 ml  Output 3450 ml  Net 1064 ml     Wt Readings from Last 3 Encounters:  12/04/22 85.4 kg  08/24/22 77.1 kg  02/17/22 81.2 kg    Physical Exam General: Alert and oriented x 3, NAD HEENT: poor dentition Cardiovascular: S1 S2 clear, RRR.  Respiratory: CTAB, no wheezing Gastrointestinal: Soft, nontender, nondistended, NBS Ext: no pedal edema bilaterally Neuro: no new deficits Psych: Normal affect     Data Reviewed:  I have personally reviewed following labs    CBC Lab Results  Component Value Date   WBC 7.3 12/05/2022   RBC 3.38 (L) 12/05/2022   HGB 10.9 (L) 12/05/2022   HCT 33.2 (L) 12/05/2022   MCV 98.2 12/05/2022   MCH 32.2 12/05/2022   PLT 203 12/05/2022   MCHC 32.8 12/05/2022   RDW 14.5 12/05/2022   LYMPHSABS 0.5 (L) 12/03/2022   MONOABS 0.4 12/03/2022   EOSABS 0.0 12/03/2022   BASOSABS 0.0 [id number]     Last metabolic panel Lab Results  Component Value Date   NA 137 12/05/2022   K 3.6 12/05/2022   CL 104 12/05/2022   CO2 24 12/05/2022   BUN 16 12/05/2022   CREATININE 1.39 (H) 12/05/2022   GLUCOSE 118 (H) 12/05/2022   GFRNONAA 56 (L) 12/05/2022   GFRAA >60 06/10/2018   CALCIUM 8.7 (L) 12/05/2022   PHOS 2.8 12/05/2022   PROT 7.9 12/03/2022   ALBUMIN 3.8 12/03/2022   BILITOT 0.6 12/03/2022   ALKPHOS 55 12/03/2022   AST 34 12/03/2022   ALT 20 12/03/2022   ANIONGAP 9 12/05/2022    CBG (last 3)  Recent Labs    12/04/22 1557 12/04/22 2207 12/05/22 0813  GLUCAP 166* 140* 165*      Coagulation Profile: No results for input(s): "INR", "PROTIME" in the last 168 hours.   Radiology Studies: I have personally reviewed the imaging studies  No results found.     Estill Cotta M.D. [facility] 12/05/2022, 11:23 AM  Available via Epic secure chat 7am-7pm After 7 pm, please refer to night coverage provider listed on amion.

## 2022-12-06 DIAGNOSIS — W19XXXA Unspecified fall, initial encounter: Secondary | ICD-10-CM | POA: Diagnosis not present

## 2022-12-06 DIAGNOSIS — F10930 Alcohol use, unspecified with withdrawal, uncomplicated: Secondary | ICD-10-CM | POA: Diagnosis not present

## 2022-12-06 DIAGNOSIS — E8729 Other acidosis: Secondary | ICD-10-CM | POA: Diagnosis not present

## 2022-12-06 LAB — BASIC METABOLIC PANEL
Anion gap: 7 (ref 5–15)
BUN: 15 mg/dL (ref 8–23)
CO2: 25 mmol/L (ref 22–32)
Calcium: 8.9 mg/dL (ref 8.9–10.3)
Chloride: 104 mmol/L (ref 98–111)
Creatinine, Ser: 1.54 mg/dL — ABNORMAL HIGH (ref 0.61–1.24)
GFR, Estimated: 49 mL/min — ABNORMAL LOW (ref >60)
Glucose, Bld: 146 mg/dL — ABNORMAL HIGH (ref 70–99)
Potassium: 3.9 mmol/L (ref 3.5–5.1)
Sodium: 136 mmol/L (ref 135–145)

## 2022-12-06 LAB — PHOSPHORUS: Phosphorus: 3.2 mg/dL (ref 2.5–4.6)

## 2022-12-06 LAB — CBC
HCT: 31.7 % — ABNORMAL LOW (ref 39.0–52.0)
Hemoglobin: 10.6 g/dL — ABNORMAL LOW (ref 13.0–17.0)
MCH: 32.4 pg (ref 26.0–34.0)
MCHC: 33.4 g/dL (ref 30.0–36.0)
MCV: 96.9 fL (ref 80.0–100.0)
Platelets: 196 10*3/uL (ref 150–400)
RBC: 3.27 MIL/uL — ABNORMAL LOW (ref 4.22–5.81)
RDW: 14.2 % (ref 11.5–15.5)
WBC: 7.9 10*3/uL (ref 4.0–10.5)
nRBC: 0 % (ref 0.0–0.2)

## 2022-12-06 LAB — GLUCOSE, CAPILLARY: Glucose-Capillary: 158 mg/dL — ABNORMAL HIGH (ref 70–99)

## 2022-12-06 LAB — MAGNESIUM: Magnesium: 1.7 mg/dL (ref 1.7–2.4)

## 2022-12-06 MED ORDER — AMOXICILLIN-POT CLAVULANATE 875-125 MG PO TABS: 1.0000 | ORAL_TABLET | Freq: Two times a day (BID) | ORAL | Status: DC

## 2022-12-06 MED ORDER — AMOXICILLIN-POT CLAVULANATE 875-125 MG PO TABS: 1.0000 | ORAL_TABLET | Freq: Two times a day (BID) | ORAL | 0 refills | Status: AC

## 2022-12-06 MED ORDER — AMOXICILLIN-POT CLAVULANATE 875-125 MG PO TABS
1.0000 | ORAL_TABLET | Freq: Two times a day (BID) | ORAL | Status: DC
  Administered 2022-12-06: 1 via ORAL
  Filled 2022-12-06: qty 1

## 2022-12-06 MED ORDER — TRAMADOL HCL 50 MG PO TABS: 50.0000 mg | ORAL_TABLET | Freq: Four times a day (QID) | ORAL | 0 refills | Status: DC | PRN

## 2022-12-06 MED ORDER — TRAMADOL HCL 50 MG PO TABS: 50.0000 mg | ORAL_TABLET | Freq: Four times a day (QID) | ORAL | Status: DC | PRN

## 2022-12-06 MED ORDER — AMLODIPINE BESYLATE 5 MG PO TABS: 5.0000 mg | ORAL_TABLET | Freq: Every day | ORAL | Status: DC

## 2022-12-06 MED ORDER — GABAPENTIN 100 MG PO CAPS: 100.0000 mg | ORAL_CAPSULE | Freq: Two times a day (BID) | ORAL | 1 refills | Status: DC

## 2022-12-06 NOTE — Discharge Summary (Signed)
Physician Discharge Summary   Patient: John Fisher MRN: [id number] DOB: 1956/09/03  Admit date:     12/03/2022  Discharge date: 12/06/22  Discharge Physician: Estill Cotta, MD    PCP: [facility]   Recommendations at discharge:   Placed on Augmentin 875-125 1 tab p.o. twice daily for 7 days Patient recommended to follow-up with dental surgeon is at [facility] for his dental pain and caries while waiting for dental implants in July this year SOFT diet  Discharge Diagnoses:    Alcoholic ketoacidosis Orthostatic hypotension.   Acute kidney injury superimposed on CKD stage IIIb Diabetes mellitus type 2, NIDDM Neuropathy   Vitamin B12 deficiency   Hypertension   Dyslipidemia   Diabetes mellitus (HCC)   Dysphagia   Alcohol withdrawal [facility])   [facility] Course: Patient is a 67 year old male with diabetes mellitus, HLP, ongoing alcohol use, poor dentition leading to poor p.o. intake and hypoglycemia.  He attributes this to his poor dentition, hence poor p.o. intake contributing to the weight loss and dizziness.  He also has ongoing alcohol use which has resulted in recurrent hypoglycemia and syncopal episodes.  On the day of admission he had a hard time getting out of the bed without being dizzy but did not have a syncopal event. In ED, temp 98.7 F, pulse 93, BP 122/74, RR 18.  Orthostatic in ED with a standing BP 95/79 Labs showed sodium 139, potassium 5.2, CO2 10, BUN 27, creatinine 1.93 Creatinine was 1.65 on 08/25/2022 Hb 12.4 WBC is 14.2, MCV 105.9.  Patient was admitted for further workup  Assessment and Plan:  Orthostatic hypotension  acute kidney injury superimposed on CKD stage IIIb -In ED, noted to be orthostatic and hypotensive -Patient was placed on IV fluid hydration -He is now tolerating soft diet without any difficulty. -Creatinine 1.93 on admission, improved to 1.5 at discharge.  Likely has underlying CKD, his GFR has ranged 43-46 in [id number]       Alcoholic ketoacidosis, starvation ketoacidosis, hypoglycemia -Initially placed on dysphagia 1 diet, patient now tolerating mechanical soft diet/dysphagia 3  -Continue Ensure, boost, nutritional supplements  Dental caries -Patient reports of dental pain -Per patient he has dental appointment at [facility] in July and needs dental implants, he does not want dentures.  Recommended to continue soft diet until his dental implants are placed and to call in a.m. for dental evaluation appointment at [facility] on Augmentin for 7 days and tramadol for pain   Diabetes mellitus, type II, NIDDM -Hemoglobin A1c 5.2 on 08/24/2022 -Patient declines subcu insulin sliding scale, on metformin 500 mg daily, will continue   Neuropathy -Reported neuropathy in feet, started on gabapentin 100 mg twice daily     Hypertension -BP now stable     Dyslipidemia -Continue statin       Alcohol withdrawal ([place]) -Patient was placed on CIWA protocol with Ativan, thiamine and folate acid.   -Currently stable, not in any acute withdrawals   Fall, generalized debility -PT OT evaluation completed on 3/22, no PT follow-up, patient mobilizing well       Pain control - [place] Controlled Substance Reporting System database was reviewed. and patient was instructed, not to drive, operate heavy machinery, perform activities at heights, swimming or participation in water activities or provide baby-sitting services while on Pain, Sleep and Anxiety Medications; until their outpatient Physician has advised to do so again. Also recommended to not to take more than prescribed Pain, Sleep and Anxiety Medications.  Consultants: None Procedures performed: None Disposition: Home Diet recommendation: Soft diet  DISCHARGE MEDICATION: Allergies as of 12/06/2022       Reactions   Trospium    Other Reaction(s): Retention of urine   Alfuzosin Anxiety        Medication List     TAKE these medications    amLODipine 5  MG tablet Commonly known as: NORVASC Take 1 tablet by mouth daily.   amoxicillin-clavulanate 875-125 MG tablet Commonly known as: AUGMENTIN Take 1 tablet by mouth 2 (two) times daily for 7 days.   aspirin EC 81 MG tablet Take 81 mg by mouth daily.   atorvastatin 20 MG tablet Commonly known as: LIPITOR Take 10 mg by mouth daily.   cholecalciferol 25 MCG (1000 UNIT) tablet Commonly known as: VITAMIN D3 Take 1,000 Units by mouth daily.   DULoxetine 60 MG capsule Commonly known as: CYMBALTA Take 60 mg by mouth daily.   feeding supplement Liqd Take 237 mLs by mouth 2 (two) times daily between meals.   fluticasone 50 MCG/ACT nasal spray Commonly known as: FLONASE Place 1 spray into both nostrils 2 (two) times daily as needed for allergies or rhinitis.   folic acid 1 MG tablet Commonly known as: FOLVITE Take 1 tablet (1 mg total) by mouth daily.   gabapentin 100 MG capsule Commonly known as: NEURONTIN Take 1 capsule (100 mg total) by mouth 2 (two) times daily.   metFORMIN 500 MG tablet Commonly known as: GLUCOPHAGE Take 500 mg by mouth daily.   naloxone 4 MG/0.1ML Liqd nasal spray kit Commonly known as: NARCAN Place 1 spray into the nose once.   traMADol 50 MG tablet Commonly known as: ULTRAM Take 1 tablet (50 mg total) by mouth every 6 (six) hours as needed for moderate pain or severe pain.   Vitamin B Complex Tabs Take 1 tablet by mouth daily.        Follow-up Information     Clinic, [place] [facility]. Schedule an appointment as soon as possible for a visit in 1 week(s).   Why: for hospital follow-up, call tomorrow for dental evaluation Contact information: [place] [address] [phone]                Discharge Exam: Filed Weights   12/03/22 2300 12/04/22 1128  Weight: 77.1 kg 85.4 kg   S: Tolerating soft diet without any difficulty.  Ambulating, PT evaluation done, no PT follow-up needed.    BP (!)  156/106 (BP Location: Right Arm)   Pulse 81   Temp 97.6 F (36.4 C) (Oral)   Resp 16   Ht 6' (1.829 m)   Wt 85.4 kg   SpO2 99%   BMI 25.52 kg/m   Physical Exam General: Alert and oriented x 3, NAD Cardiovascular: S1 S2 clear, RRR.  Respiratory: CTAB Gastrointestinal: Soft, nontender, nondistended, NBS Ext: no pedal edema bilaterally Neuro: no new deficits Psych: Normal affect    Condition at discharge: fair  The results of significant diagnostics from this hospitalization (including imaging, microbiology, ancillary and laboratory) are listed below for reference.   Imaging Studies: No results found.  Microbiology: No results found for this or any previous visit.  Labs: CBC: Recent Labs  Lab 12/03/22 1515 12/04/22 0730 12/05/22 0529 12/06/22 0449  WBC 14.2* 9.3 7.3 7.9  NEUTROABS 13.2*  --   --   --   HGB 12.4* 10.6* 10.9* 10.6*  HCT 39.8 32.0* 33.2* 31.7*  MCV 105.9* 98.8 98.2  96.9  PLT 255 209 203 [id number]   Basic Metabolic Panel: Recent Labs  Lab 12/03/22 1515 12/03/22 1821 12/03/22 2137 12/04/22 0730 12/05/22 0529 12/06/22 0449  NA 139 133*  --  136 137 136  K 5.2* 5.1  --  4.5 3.6 3.9  CL 104 101  --  106 104 104  CO2 10* 16*  --  23 24 25   GLUCOSE 111* 257*  --  100* 118* 146*  BUN 27* 28*  --  24* 16 15  CREATININE 1.93* 1.89*  --  1.55* 1.39* 1.54*  CALCIUM 9.5 8.8*  --  8.4* 8.7* 8.9  MG  --   --  1.8  --  1.8 1.7  PHOS  --   --  5.3*  --  2.8 3.2   Liver Function Tests: Recent Labs  Lab 12/03/22 1515  AST 34  ALT 20  ALKPHOS 55  BILITOT 0.6  PROT 7.9  ALBUMIN 3.8   CBG: Recent Labs  Lab 12/05/22 0813 12/05/22 1159 12/05/22 1648 12/05/22 2108 12/06/22 0803  GLUCAP 165* 137* 114* 124* 158*    Discharge time spent: greater than 30 minutes.  Signed: Estill Cotta, MD [facility] 12/06/2022

## 2022-12-08 ENCOUNTER — Encounter (HOSPITAL_COMMUNITY): Payer: Self-pay | Admitting: Internal Medicine

## 2022-12-08 ENCOUNTER — Other Ambulatory Visit: Payer: Self-pay

## 2022-12-08 ENCOUNTER — Emergency Department (HOSPITAL_COMMUNITY): Payer: No Typology Code available for payment source

## 2022-12-08 ENCOUNTER — Observation Stay (HOSPITAL_COMMUNITY)
Admission: EM | Admit: 2022-12-08 | Discharge: 2022-12-09 | Disposition: A | Discharge status: Home Health | Payer: No Typology Code available for payment source | Attending: Internal Medicine | Admitting: Internal Medicine

## 2022-12-08 DIAGNOSIS — R55 Syncope and collapse: Secondary | ICD-10-CM

## 2022-12-08 DIAGNOSIS — S0990XA Unspecified injury of head, initial encounter: Secondary | ICD-10-CM | POA: Diagnosis not present

## 2022-12-08 DIAGNOSIS — R42 Dizziness and giddiness: Principal | ICD-10-CM | POA: Insufficient documentation

## 2022-12-08 DIAGNOSIS — K089 Disorder of teeth and supporting structures, unspecified: Secondary | ICD-10-CM

## 2022-12-08 DIAGNOSIS — Z7952 Long term (current) use of systemic steroids: Secondary | ICD-10-CM | POA: Diagnosis not present

## 2022-12-08 DIAGNOSIS — Z7984 Long term (current) use of oral hypoglycemic drugs: Secondary | ICD-10-CM | POA: Diagnosis not present

## 2022-12-08 DIAGNOSIS — Z96651 Presence of right artificial knee joint: Secondary | ICD-10-CM | POA: Insufficient documentation

## 2022-12-08 DIAGNOSIS — W01198A Fall on same level from slipping, tripping and stumbling with subsequent striking against other object, initial encounter: Secondary | ICD-10-CM | POA: Diagnosis not present

## 2022-12-08 DIAGNOSIS — Z79899 Other long term (current) drug therapy: Secondary | ICD-10-CM | POA: Insufficient documentation

## 2022-12-08 DIAGNOSIS — I10 Essential (primary) hypertension: Secondary | ICD-10-CM | POA: Diagnosis not present

## 2022-12-08 DIAGNOSIS — E119 Type 2 diabetes mellitus without complications: Secondary | ICD-10-CM | POA: Diagnosis not present

## 2022-12-08 DIAGNOSIS — S0101XA Laceration without foreign body of scalp, initial encounter: Secondary | ICD-10-CM | POA: Diagnosis not present

## 2022-12-08 DIAGNOSIS — Y92009 Unspecified place in unspecified non-institutional (private) residence as the place of occurrence of the external cause: Secondary | ICD-10-CM | POA: Insufficient documentation

## 2022-12-08 DIAGNOSIS — Z7982 Long term (current) use of aspirin: Secondary | ICD-10-CM | POA: Insufficient documentation

## 2022-12-08 DIAGNOSIS — K007 Teething syndrome: Secondary | ICD-10-CM | POA: Insufficient documentation

## 2022-12-08 LAB — CBC WITH DIFFERENTIAL/PLATELET
Abs Immature Granulocytes: 0.03 10*3/uL (ref 0.00–0.07)
Basophils Absolute: 0 10*3/uL (ref 0.0–0.1)
Basophils Relative: 0 %
Eosinophils Absolute: 0 10*3/uL (ref 0.0–0.5)
Eosinophils Relative: 0 %
HCT: 33.6 % — ABNORMAL LOW (ref 39.0–52.0)
Hemoglobin: 11.1 g/dL — ABNORMAL LOW (ref 13.0–17.0)
Immature Granulocytes: 0 %
Lymphocytes Relative: 19 %
Lymphs Abs: 1.7 10*3/uL (ref 0.7–4.0)
MCH: 32.2 pg (ref 26.0–34.0)
MCHC: 33 g/dL (ref 30.0–36.0)
MCV: 97.4 fL (ref 80.0–100.0)
Monocytes Absolute: 0.8 10*3/uL (ref 0.1–1.0)
Monocytes Relative: 9 %
Neutro Abs: 6.3 10*3/uL (ref 1.7–7.7)
Neutrophils Relative %: 72 %
Platelets: 256 10*3/uL (ref 150–400)
RBC: 3.45 MIL/uL — ABNORMAL LOW (ref 4.22–5.81)
RDW: 14.9 % (ref 11.5–15.5)
WBC: 8.8 10*3/uL (ref 4.0–10.5)
nRBC: 0 % (ref 0.0–0.2)

## 2022-12-08 LAB — URINALYSIS, ROUTINE W REFLEX MICROSCOPIC
Bilirubin Urine: NEGATIVE
Glucose, UA: NEGATIVE mg/dL
Hgb urine dipstick: NEGATIVE
Ketones, ur: 5 mg/dL — AB
Leukocytes,Ua: NEGATIVE
Nitrite: NEGATIVE
Protein, ur: NEGATIVE mg/dL
Specific Gravity, Urine: 1.014 (ref 1.005–1.030)
pH: 6 (ref 5.0–8.0)

## 2022-12-08 LAB — COMPREHENSIVE METABOLIC PANEL
ALT: 51 U/L — ABNORMAL HIGH (ref 0–44)
AST: 96 U/L — ABNORMAL HIGH (ref 15–41)
Albumin: 3.6 g/dL (ref 3.5–5.0)
Alkaline Phosphatase: 55 U/L (ref 38–126)
Anion gap: 12 (ref 5–15)
BUN: 20 mg/dL (ref 8–23)
CO2: 23 mmol/L (ref 22–32)
Calcium: 9.3 mg/dL (ref 8.9–10.3)
Chloride: 103 mmol/L (ref 98–111)
Creatinine, Ser: 1.75 mg/dL — ABNORMAL HIGH (ref 0.61–1.24)
GFR, Estimated: 42 mL/min — ABNORMAL LOW (ref >60)
Glucose, Bld: 132 mg/dL — ABNORMAL HIGH (ref 70–99)
Potassium: 4 mmol/L (ref 3.5–5.1)
Sodium: 138 mmol/L (ref 135–145)
Total Bilirubin: 0.8 mg/dL (ref 0.3–1.2)
Total Protein: 7.8 g/dL (ref 6.5–8.1)

## 2022-12-08 LAB — TROPONIN I (HIGH SENSITIVITY)
Troponin I (High Sensitivity): 8 ng/L (ref <18)
Troponin I (High Sensitivity): 8 ng/L (ref <18)

## 2022-12-08 LAB — ETHANOL: Alcohol, Ethyl (B): <10 mg/dL (ref <10)

## 2022-12-08 LAB — LACTIC ACID, PLASMA
Lactic Acid, Venous: 2 mmol/L (ref 0.5–1.9)
Lactic Acid, Venous: 1.6 mmol/L (ref 0.5–1.9)

## 2022-12-08 MED ORDER — ACETAMINOPHEN 650 MG RE SUPP: 650.0000 mg | Freq: Four times a day (QID) | RECTAL | Status: DC | PRN

## 2022-12-08 MED ORDER — LACTATED RINGERS IV SOLN: INTRAVENOUS | Status: DC

## 2022-12-08 MED ORDER — FLUTICASONE PROPIONATE 50 MCG/ACT NA SUSP: 1.0000 | Freq: Two times a day (BID) | NASAL | Status: DC | PRN

## 2022-12-08 MED ORDER — SODIUM CHLORIDE 0.9 % IV BOLUS
1000.0000 mL | Freq: Once | INTRAVENOUS | Status: AC
  Administered 2022-12-08: 1000 mL via INTRAVENOUS

## 2022-12-08 MED ORDER — ENOXAPARIN SODIUM 40 MG/0.4ML IJ SOSY
40.0000 mg | PREFILLED_SYRINGE | INTRAMUSCULAR | Status: DC
  Administered 2022-12-08: 40 mg via SUBCUTANEOUS
  Filled 2022-12-08: qty 0.4

## 2022-12-08 MED ORDER — DULOXETINE HCL 30 MG PO CPEP
60.0000 mg | ORAL_CAPSULE | Freq: Every day | ORAL | Status: DC
  Administered 2022-12-09: 60 mg via ORAL
  Filled 2022-12-08: qty 2

## 2022-12-08 MED ORDER — AMOXICILLIN-POT CLAVULANATE 875-125 MG PO TABS
1.0000 | ORAL_TABLET | Freq: Two times a day (BID) | ORAL | Status: DC
  Administered 2022-12-08 – 2022-12-09 (×2): 1 via ORAL
  Filled 2022-12-08 (×2): qty 1

## 2022-12-08 MED ORDER — GABAPENTIN 100 MG PO CAPS: 100.0000 mg | ORAL_CAPSULE | Freq: Two times a day (BID) | ORAL | Status: DC

## 2022-12-08 MED ORDER — FOLIC ACID 1 MG PO TABS
1.0000 mg | ORAL_TABLET | Freq: Every day | ORAL | Status: DC
  Administered 2022-12-09: 1 mg via ORAL
  Filled 2022-12-08: qty 1

## 2022-12-08 MED ORDER — B COMPLEX-C PO TABS
1.0000 | ORAL_TABLET | Freq: Every day | ORAL | Status: DC
  Filled 2022-12-08: qty 1

## 2022-12-08 MED ORDER — ASPIRIN 81 MG PO TBEC
81.0000 mg | DELAYED_RELEASE_TABLET | Freq: Every day | ORAL | Status: DC
  Administered 2022-12-09: 81 mg via ORAL
  Filled 2022-12-08: qty 1

## 2022-12-08 MED ORDER — VITAMIN D 25 MCG (1000 UNIT) PO TABS
1000.0000 [IU] | ORAL_TABLET | Freq: Every day | ORAL | Status: DC
  Administered 2022-12-09: 1000 [IU] via ORAL
  Filled 2022-12-08: qty 1

## 2022-12-08 MED ORDER — ACETAMINOPHEN 325 MG PO TABS: 650.0000 mg | ORAL_TABLET | Freq: Four times a day (QID) | ORAL | Status: DC | PRN

## 2022-12-08 MED ORDER — ATORVASTATIN CALCIUM 10 MG PO TABS
10.0000 mg | ORAL_TABLET | Freq: Every day | ORAL | Status: DC
  Administered 2022-12-09: 10 mg via ORAL
  Filled 2022-12-08: qty 1

## 2022-12-08 MED ORDER — SODIUM CHLORIDE 0.9% FLUSH
3.0000 mL | Freq: Two times a day (BID) | INTRAVENOUS | Status: DC
  Administered 2022-12-08 – 2022-12-09 (×2): 3 mL via INTRAVENOUS

## 2022-12-08 NOTE — Assessment & Plan Note (Signed)
-   Hold amlodipine for now in setting of recent orthostasis

## 2022-12-08 NOTE — ED Triage Notes (Signed)
[facility] paramedics report patient came in for syncope and patient went to the kitchen to get a snack and passed out. Patient has a laceration to the back of the head. Patient bleeding is controled. AAOx4, NAD. Respiration even and unlabored. Denies SOB, Dizziness and chest pain. EDP at bed sides for eval.

## 2022-12-08 NOTE — H&P (Signed)
History and Physical    John Fisher  [id number]  DOB: 1956-04-16  DOA: 12/08/2022  PCP: [facility] Patient coming from: Home  Chief Complaint: dizziness  HPI:  John Fisher is a 67 yo male with PMH DMII, HTN, OA, alcohol use who presents with dizziness and a recurrent fall at home. Patient states that he fell over in the kitchen on 12/07/2022 and laid on his back for approximately a couple hours.  He then crawled on his back to bed.  Due to dizziness and bleeding from his back scalp, he was unable to stand.  Ultimately, EMS brought patient to the ER.  Patient just hopspitalized from 3/21 to 3/24 for orthostatic hypotension and alcoholic ketoacidosis.  He also attributes his dizziness due to poor oral intake from extremely poor dentition and is awaiting dental implants in July 2024 through the [place].  He has been trying soft foods at home although has difficulty keeping up with nutrition at times and does not drink enough fluids either he says. He was hydrated last admission and ambulated prior to discharge although still had some dizziness. He declined a walker at discharge but now states he would attempt using one at discharge this time around.  He attempted orthostatics in the ER and was unable to stand up due to already developing dizziness when sitting.  He is therefore admitted for further fluid rehydration and reattempt of orthostatics along with repeat PT evaluation.  CT head on admission was negative for acute abnormalities. Lab work rather unremarkable.  Mildly elevated lactic, 2 considered in setting of dehydration.  I have personally briefly reviewed patient's old medical records in [facility] and discussed patient with the ER provider when appropriate/indicated.  Assessment and Plan: * Dizziness - Patient states this has been an issue for at least 2 years.  Negative CT head on admission.  Would suspect underlying stroke to be evident at this time.  Patient  seems to think his dizziness is related to his poor intake from poor dentition.  Patient also was noted to be orthostatic prior admission a few days ago -Continue fluids - Repeat orthostatics in a.m. - PT eval in a.m. - Hold off on MRI brain unless worsening symptoms  Poor dentition - Appointment with the [facility] in July for dental implants - Previously started on Augmentin last hospitalization for a 7-day course, continue this for now  Hypertension - Hold amlodipine for now in setting of recent orthostasis  DMII (diabetes mellitus, type 2) ([place]) - last A1c 5.3% in 08/24/22 - diet control okay for now; on metformin at home    Code Status:     Code Status: Full Code  DVT Prophylaxis: Lovenox     Anticipated disposition is to: Home  History: Past Medical History:  Diagnosis Date   Diabetes mellitus    Erectile dysfunction    Hypertension    Hypogonadism male    Osteoarthritis     Past Surgical History:  Procedure Laterality Date   left knee replacement  02/13/2016   REPLACEMENT TOTAL KNEE Right      reports that he has never smoked. He has never used smokeless tobacco. He reports current alcohol use of about 14.0 standard drinks of alcohol per week. He reports current drug use. Drug: Marijuana.  Allergies  Allergen Reactions   Trospium     Other Reaction(s): Retention of urine   Alfuzosin Anxiety    Family History  Problem Relation Age of Onset   Diabetes  Mother    Hypertension Father    Diabetes Father    Diabetes Brother    Home Medications: Prior to Admission medications   Medication Sig Start Date End Date Taking? Authorizing Provider  amLODipine (NORVASC) 5 MG tablet Take 1 tablet by mouth daily. 10/22/22   [provider]  amoxicillin-clavulanate (AUGMENTIN) 875-125 MG tablet Take 1 tablet by mouth 2 (two) times daily for 7 days. 12/06/22 12/13/22  Rai, Vernelle Emerald, MD  aspirin EC 81 MG tablet Take 81 mg by mouth daily.    [provider]   atorvastatin (LIPITOR) 20 MG tablet Take 10 mg by mouth daily.    [provider]  B Complex Vitamins (VITAMIN B COMPLEX) TABS Take 1 tablet by mouth daily.    [provider]  cholecalciferol (VITAMIN D3) 25 MCG (1000 UNIT) tablet Take 1,000 Units by mouth daily.    [provider]  DULoxetine (CYMBALTA) 60 MG capsule Take 60 mg by mouth daily.    [provider]  feeding supplement (ENSURE ENLIVE / ENSURE PLUS) LIQD Take 237 mLs by mouth 2 (two) times daily between meals. 09/02/22   Martyn Malay, MD  fluticasone (FLONASE) 50 MCG/ACT nasal spray Place 1 spray into both nostrils 2 (two) times daily as needed for allergies or rhinitis. 09/11/20   Garnet Sierras, DO  folic acid (FOLVITE) 1 MG tablet Take 1 tablet (1 mg total) by mouth daily. 08/31/22   Alcus Dad, MD  gabapentin (NEURONTIN) 100 MG capsule Take 1 capsule (100 mg total) by mouth 2 (two) times daily. 12/06/22   Rai, Vernelle Emerald, MD  metFORMIN (GLUCOPHAGE) 500 MG tablet Take 500 mg by mouth daily.    [provider]  naloxone [facility]) nasal spray 4 mg/0.1 mL Place 1 spray into the nose once. 01/12/22   [provider]  traMADol (ULTRAM) 50 MG tablet Take 1 tablet (50 mg total) by mouth every 6 (six) hours as needed for moderate pain or severe pain. 12/06/22   Mendel Corning, MD    Review of Systems:  Review of Systems  Constitutional: Negative.   HENT: Negative.    Eyes: Negative.   Respiratory: Negative.    Cardiovascular: Negative.   Gastrointestinal: Negative.   Genitourinary: Negative.   Musculoskeletal: Negative.   Skin: Negative.   Neurological:  Positive for dizziness.  Endo/Heme/Allergies: Negative.   Psychiatric/Behavioral: Negative.      Physical Exam:  Vitals:   12/08/22 1100 12/08/22 1303 12/08/22 1400 12/08/22 1430  BP: (!) 169/107  (!) 153/88 (!) 168/90  Pulse: 72  70 66  Resp: (!) 23  (!) 28 (!) 24  Temp:  98.1 F (36.7 C)    TempSrc:  Oral     SpO2: 100%  100% 100%  Weight:      Height:       Physical Exam Constitutional:      General: He is not in acute distress.    Appearance: Normal appearance. He is not ill-appearing.  HENT:     Head: Normocephalic.     Comments: Mild swelling on posterior scalp with 2 small abrasions, no laceration appreciated.    Mouth/Throat:     Mouth: Mucous membranes are dry.     Comments: Poor dentition appreciated Eyes:     Extraocular Movements: Extraocular movements intact.  Cardiovascular:     Rate and Rhythm: Normal rate and regular rhythm.  Pulmonary:     Effort: Pulmonary effort is normal.  Breath sounds: Normal breath sounds.  Abdominal:     General: Bowel sounds are normal. There is no distension.     Palpations: Abdomen is soft.     Tenderness: There is no abdominal tenderness.  Musculoskeletal:        General: No swelling. Normal range of motion.     Cervical back: Normal range of motion and neck supple.  Skin:    General: Skin is warm and dry.  Neurological:     General: No focal deficit present.     Mental Status: He is alert.  Psychiatric:        Mood and Affect: Mood normal.        Behavior: Behavior normal.      Labs on Admission:  I have personally reviewed following labs and imaging studies Results for orders placed or performed during the hospital encounter of 12/08/22 (from the past 24 hour(s))  Comprehensive metabolic panel     Status: Abnormal   Collection Time: 12/08/22  9:30 AM  Result Value Ref Range   Sodium 138 135 - 145 mmol/L   Potassium 4.0 3.5 - 5.1 mmol/L   Chloride 103 98 - 111 mmol/L   CO2 23 22 - 32 mmol/L   Glucose, Bld 132 (H) 70 - 99 mg/dL   BUN 20 8 - 23 mg/dL   Creatinine, Ser 1.75 (H) 0.61 - 1.24 mg/dL   Calcium 9.3 8.9 - 10.3 mg/dL   Total Protein 7.8 6.5 - 8.1 g/dL   Albumin 3.6 3.5 - 5.0 g/dL   AST 96 (H) 15 - 41 U/L   ALT 51 (H) 0 - 44 U/L   Alkaline Phosphatase 55 38 - 126 U/L   Total Bilirubin 0.8 0.3 - 1.2 mg/dL    GFR, Estimated 42 (L) >60 mL/min   Anion gap 12 5 - 15  Lactic acid, plasma     Status: Abnormal   Collection Time: 12/08/22  9:30 AM  Result Value Ref Range   Lactic Acid, Venous 2.0 (HH) 0.5 - 1.9 mmol/L  CBC with Differential     Status: Abnormal   Collection Time: 12/08/22  9:30 AM  Result Value Ref Range   WBC 8.8 4.0 - 10.5 K/uL   RBC 3.45 (L) 4.22 - 5.81 MIL/uL   Hemoglobin 11.1 (L) 13.0 - 17.0 g/dL   HCT 33.6 (L) 39.0 - 52.0 %   MCV 97.4 80.0 - 100.0 fL   MCH 32.2 26.0 - 34.0 pg   MCHC 33.0 30.0 - 36.0 g/dL   RDW 14.9 11.5 - 15.5 %   Platelets 256 150 - 400 K/uL   nRBC 0.0 0.0 - 0.2 %   Neutrophils Relative % 72 %   Neutro Abs 6.3 1.7 - 7.7 K/uL   Lymphocytes Relative 19 %   Lymphs Abs 1.7 0.7 - 4.0 K/uL   Monocytes Relative 9 %   Monocytes Absolute 0.8 0.1 - 1.0 K/uL   Eosinophils Relative 0 %   Eosinophils Absolute 0.0 0.0 - 0.5 K/uL   Basophils Relative 0 %   Basophils Absolute 0.0 0.0 - 0.1 K/uL   Immature Granulocytes 0 %   Abs Immature Granulocytes 0.03 0.00 - 0.07 K/uL  Troponin I (High Sensitivity)     Status: None   Collection Time: 12/08/22  9:30 AM  Result Value Ref Range   Troponin I (High Sensitivity) 8 <18 ng/L  Ethanol     Status: None   Collection Time: 12/08/22  9:30 AM  Result Value Ref Range   Alcohol, Ethyl (B) <10 <10 mg/dL  Urinalysis, Routine w reflex microscopic -Urine, Clean Catch     Status: Abnormal   Collection Time: 12/08/22  1:06 PM  Result Value Ref Range   Color, Urine YELLOW YELLOW   APPearance CLEAR CLEAR   Specific Gravity, Urine 1.014 1.005 - 1.030   pH 6.0 5.0 - 8.0   Glucose, UA NEGATIVE NEGATIVE mg/dL   Hgb urine dipstick NEGATIVE NEGATIVE   Bilirubin Urine NEGATIVE NEGATIVE   Ketones, ur 5 (A) NEGATIVE mg/dL   Protein, ur NEGATIVE NEGATIVE mg/dL   Nitrite NEGATIVE NEGATIVE   Leukocytes,Ua NEGATIVE NEGATIVE  Lactic acid, plasma     Status: None   Collection Time: 12/08/22  1:30 PM  Result Value Ref Range   Lactic  Acid, Venous 1.6 0.5 - 1.9 mmol/L  Troponin I (High Sensitivity)     Status: None   Collection Time: 12/08/22  1:30 PM  Result Value Ref Range   Troponin I (High Sensitivity) 8 <18 ng/L     Radiological Exams on Admission: CT Head Wo Contrast  Result Date: 12/08/2022 CLINICAL DATA:  Head trauma, minor (Age >= 65y) EXAM: CT HEAD WITHOUT CONTRAST TECHNIQUE: Contiguous axial images were obtained from the base of the skull through the vertex without intravenous contrast. RADIATION DOSE REDUCTION: This exam was performed according to the departmental dose-optimization program which includes automated exposure control, adjustment of the mA and/or kV according to patient size and/or use of iterative reconstruction technique. COMPARISON:  CT head 08/24/2022. FINDINGS: Brain: No evidence of acute infarction, hemorrhage, hydrocephalus, extra-axial collection or mass lesion/mass effect. Vascular: No hyperdense vessel. Skull: No acute fracture. Sinuses/Orbits: Clear sinuses.  No acute orbital findings. Other: No mastoid effusions. IMPRESSION: No evidence of acute intracranial abnormality. Electronically Signed   By: Margaretha Sheffield M.D.   On: 12/08/2022 10:27   CT Head Wo Contrast  Final Result      Consults called:  N/a   EKG: Independently reviewed. NSR   Dwyane Dee, MD [facility] 12/08/2022, 3:53 PM

## 2022-12-08 NOTE — ED Notes (Signed)
Patient transported to CT 

## 2022-12-08 NOTE — Progress Notes (Signed)
PT Cancellation Note  Patient Details Name: COLBEN DENNO MRN: [id number] DOB: 1955/12/31   Cancelled Treatment:    Reason Eval/Treat Not Completed: Other (comment) Pt admitted <8 hr.  Noted in MD note for PT eval in am.  Will f/u tomorrow. Abran Richard, PT Acute Rehab [facility] [phone]   Karlton Lemon 12/08/2022, 5:05 PM

## 2022-12-08 NOTE — Progress Notes (Signed)
Patient arrived to room 1508 in NAD, VS stable. Patient oriented to room and call bell in reach. Patient notified his family.

## 2022-12-08 NOTE — Hospital Course (Addendum)
John Fisher is a 67 yo male with PMH DMII, HTN, OA, alcohol use who presents with dizziness and a recurrent fall at home. Patient states that he fell over in the kitchen on 12/07/2022 and laid on his back for approximately a couple hours.  He then crawled on his back to bed.  Due to dizziness and bleeding from his back scalp, he was unable to stand.  Ultimately, EMS brought patient to the ER.  Patient just hopspitalized from 3/21 to 3/24 for orthostatic hypotension and alcoholic ketoacidosis.  He also attributes his dizziness due to poor oral intake from extremely poor dentition and is awaiting dental implants in July 2024 through the [place].  He has been trying soft foods at home although has difficulty keeping up with nutrition at times and does not drink enough fluids either he says. He was hydrated last admission and ambulated prior to discharge although still had some dizziness. He declined a walker at discharge but now states he would attempt using one at discharge this time around.  He attempted orthostatics in the ER and was unable to stand up due to already developing dizziness when sitting.  He is therefore admitted for further fluid rehydration and reattempt of orthostatics along with repeat PT evaluation.  CT head on admission was negative for acute abnormalities. Lab work rather unremarkable.  Mildly elevated lactic, 2 considered in setting of dehydration.  He was fluid rehydrated and had improvement in blood pressure the following morning.  He did remain very mildly orthostatic when transitioning from supine to sitting but had recovery of blood pressure after standing.  He was recommended for slow position changes at home.  He was also evaluated by physical therapy with recommendations for home health and rolling walker.  He was able to ambulate adequately with minimal guard 460 feet with rolling walker prior to discharge.

## 2022-12-08 NOTE — Assessment & Plan Note (Addendum)
-   last A1c 5.3% in 08/24/22 - diet control okay for now; on metformin at home

## 2022-12-08 NOTE — Assessment & Plan Note (Addendum)
-   Patient states this has been an issue for at least 2 years.  Negative CT head on admission.  Would suspect underlying stroke to be evident at this time.  Patient seems to think his dizziness is related to his poor intake from poor dentition.  Patient also was noted to be orthostatic prior admission a few days prior as well - mildly orthostatic with sitting but recovers with standing; patient instructed to stand slowly upon d/c and continue rehydration with liquids until able to tolerate more solids - ambulated well with PT; patient declined RW prior to d/c due to cost - daughter requested neuro referral as well upon discharge

## 2022-12-08 NOTE — ED Provider Notes (Signed)
[facility] Provider Note   CSN: [id number] Arrival date & time: 12/08/22  0848     History  Chief Complaint  Patient presents with   Loss of Consciousness    Patient had a Syncopal episode at 3 am     John Fisher is a 67 y.o. male.  He said he was getting out of his chair last evening to go to the kitchen when he had a syncopal event fell hit his head.  He thinks he was only out for a few minutes but could not get up off the floor due to general weakness.  Ultimately crawl to the bedroom and laid in bed.  When he woke up this morning he felt a little stronger and could unlock the door and call 911 to be seen.  He said he hit his head and had a laceration with significant bleeding.  He feels all of this stems from his poor dentition and multiple extractions and his lack of eating or drinking much fluids.  He was just discharged after admission for weakness low blood sugar.  Thought to be multifactorial including alcohol use.  Patient states he does not drink every day and does not think he drank yesterday.  He denies any chest pain shortness of breath abdominal pain vomiting diarrhea.  He lives alone.  The history is provided by the patient and the EMS personnel.  Loss of Consciousness Episode history:  Single Most recent episode:  Yesterday Progression:  Resolved Chronicity:  Recurrent Context: standing up   Witnessed: no   Relieved by:  Bed rest Associated symptoms: no chest pain, no difficulty breathing, no fever, no nausea, no shortness of breath and no vomiting        Home Medications Prior to Admission medications   Medication Sig Start Date End Date Taking? Authorizing Provider  amLODipine (NORVASC) 5 MG tablet Take 1 tablet by mouth daily. 10/22/22   [provider]  amoxicillin-clavulanate (AUGMENTIN) 875-125 MG tablet Take 1 tablet by mouth 2 (two) times daily for 7 days. 12/06/22 12/13/22  Rai, Vernelle Emerald, MD   aspirin EC 81 MG tablet Take 81 mg by mouth daily.    [provider]  atorvastatin (LIPITOR) 20 MG tablet Take 10 mg by mouth daily.    [provider]  B Complex Vitamins (VITAMIN B COMPLEX) TABS Take 1 tablet by mouth daily.    [provider]  cholecalciferol (VITAMIN D3) 25 MCG (1000 UNIT) tablet Take 1,000 Units by mouth daily.    [provider]  DULoxetine (CYMBALTA) 60 MG capsule Take 60 mg by mouth daily.    [provider]  feeding supplement (ENSURE ENLIVE / ENSURE PLUS) LIQD Take 237 mLs by mouth 2 (two) times daily between meals. 09/02/22   Martyn Malay, MD  fluticasone (FLONASE) 50 MCG/ACT nasal spray Place 1 spray into both nostrils 2 (two) times daily as needed for allergies or rhinitis. 09/11/20   Garnet Sierras, DO  folic acid (FOLVITE) 1 MG tablet Take 1 tablet (1 mg total) by mouth daily. 08/31/22   Alcus Dad, MD  gabapentin (NEURONTIN) 100 MG capsule Take 1 capsule (100 mg total) by mouth 2 (two) times daily. 12/06/22   Rai, Vernelle Emerald, MD  metFORMIN (GLUCOPHAGE) 500 MG tablet Take 500 mg by mouth daily.    [provider]  naloxone [facility]) nasal spray 4 mg/0.1 mL Place 1 spray into the nose once. 01/12/22  [provider]  traMADol (ULTRAM) 50 MG tablet Take 1 tablet (50 mg total) by mouth every 6 (six) hours as needed for moderate pain or severe pain. 12/06/22   Rai, Vernelle Emerald, MD      Allergies    Trospium and Alfuzosin    Review of Systems   Review of Systems  Constitutional:  Negative for fever.  HENT:  Positive for dental problem.   Respiratory:  Negative for shortness of breath.   Cardiovascular:  Positive for syncope. Negative for chest pain.  Gastrointestinal:  Negative for nausea and vomiting.  Skin:  Positive for wound.    Physical Exam Updated Vital Signs BP (!) 176/122 (BP Location: Right Arm)   Pulse 74   Temp 97.9 F (36.6 C) (Oral)   Resp 19   Ht 6' (1.829 m)   Wt 80 kg    SpO2 99%   BMI 23.92 kg/m  Physical Exam Vitals and nursing note reviewed.  Constitutional:      General: He is not in acute distress.    Appearance: Normal appearance. He is well-developed.  HENT:     Head: Normocephalic.     Comments: Patient has approximately 1.5 cm laceration on his occiput that is closed and not actively bleeding Eyes:     Conjunctiva/sclera: Conjunctivae normal.  Cardiovascular:     Rate and Rhythm: Normal rate and regular rhythm.     Heart sounds: No murmur heard. Pulmonary:     Effort: Pulmonary effort is normal. No respiratory distress.     Breath sounds: Normal breath sounds.  Abdominal:     Palpations: Abdomen is soft.     Tenderness: There is no abdominal tenderness. There is no guarding or rebound.  Musculoskeletal:        General: Signs of injury present. No deformity.     Cervical back: Neck supple.     Right lower leg: No edema.     Left lower leg: No edema.     Comments: Patient has a few skin tears on both of his elbows.  Skin:    General: Skin is warm and dry.     Capillary Refill: Capillary refill takes less than 2 seconds.  Neurological:     General: No focal deficit present.     Mental Status: He is alert and oriented to person, place, and time.     Cranial Nerves: No cranial nerve deficit.     Sensory: No sensory deficit.     Motor: No weakness.     ED Results / Procedures / Treatments   Labs (all labs ordered are listed, but only abnormal results are displayed) Labs Reviewed  COMPREHENSIVE METABOLIC PANEL - Abnormal; Notable for the following components:      Result Value   Glucose, Bld 132 (*)    Creatinine, Ser 1.75 (*)    AST 96 (*)    ALT 51 (*)    GFR, Estimated 42 (*)    All other components within normal limits  LACTIC ACID, PLASMA - Abnormal; Notable for the following components:   Lactic Acid, Venous 2.0 (*)    All other components within normal limits  CBC WITH DIFFERENTIAL/PLATELET - Abnormal; Notable for the  following components:   RBC 3.45 (*)    Hemoglobin 11.1 (*)    HCT 33.6 (*)    All other components within normal limits  URINALYSIS, ROUTINE W REFLEX MICROSCOPIC - Abnormal; Notable for the following components:   Ketones, ur 5 (*)  All other components within normal limits  LACTIC ACID, PLASMA  ETHANOL  BASIC METABOLIC PANEL  CBC WITH DIFFERENTIAL/PLATELET  MAGNESIUM  TROPONIN I (HIGH SENSITIVITY)  TROPONIN I (HIGH SENSITIVITY)    EKG EKG Interpretation  Date/Time:  Tuesday December 08 2022 09:09:35 EDT Ventricular Rate:  74 PR Interval:  161 QRS Duration: 80 QT Interval:  397 QTC Calculation: 441 R Axis:   -23 Text Interpretation: Sinus rhythm Abnormal R-wave progression, early transition Left ventricular hypertrophy COPY Confirmed by Aletta Edouard [phone]) on 12/08/2022 9:20:45 AM  Radiology CT Head Wo Contrast  Result Date: 12/08/2022 CLINICAL DATA:  Head trauma, minor (Age >= 65y) EXAM: CT HEAD WITHOUT CONTRAST TECHNIQUE: Contiguous axial images were obtained from the base of the skull through the vertex without intravenous contrast. RADIATION DOSE REDUCTION: This exam was performed according to the departmental dose-optimization program which includes automated exposure control, adjustment of the mA and/or kV according to patient size and/or use of iterative reconstruction technique. COMPARISON:  CT head 08/24/2022. FINDINGS: Brain: No evidence of acute infarction, hemorrhage, hydrocephalus, extra-axial collection or mass lesion/mass effect. Vascular: No hyperdense vessel. Skull: No acute fracture. Sinuses/Orbits: Clear sinuses.  No acute orbital findings. Other: No mastoid effusions. IMPRESSION: No evidence of acute intracranial abnormality. Electronically Signed   By: Margaretha Sheffield M.D.   On: 12/08/2022 10:27    Procedures Procedures    Medications Ordered in ED Medications  lactated ringers infusion ( Intravenous New Bag/Given 12/08/22 1722)   amoxicillin-clavulanate (AUGMENTIN) 875-125 MG per tablet 1 tablet (has no administration in time range)  aspirin EC tablet 81 mg (has no administration in time range)  atorvastatin (LIPITOR) tablet 10 mg (has no administration in time range)  Vitamin B Complex TABS 1 tablet (has no administration in time range)  cholecalciferol (VITAMIN D3) 25 MCG (1000 UNIT) tablet 1,000 Units (has no administration in time range)  DULoxetine (CYMBALTA) DR capsule 60 mg (has no administration in time range)  fluticasone (FLONASE) 50 MCG/ACT nasal spray 1 spray (has no administration in time range)  folic acid (FOLVITE) tablet 1 mg (has no administration in time range)  gabapentin (NEURONTIN) capsule 100 mg (has no administration in time range)  enoxaparin (LOVENOX) injection 40 mg (has no administration in time range)  sodium chloride flush (NS) 0.9 % injection 3 mL (has no administration in time range)  acetaminophen (TYLENOL) tablet 650 mg (has no administration in time range)    Or  acetaminophen (TYLENOL) suppository 650 mg (has no administration in time range)  sodium chloride 0.9 % bolus 1,000 mL (0 mLs Intravenous Stopped 12/08/22 1435)  sodium chloride 0.9 % bolus 1,000 mL (1,000 mLs Intravenous New Bag/Given 12/08/22 1327)    ED Course/ Medical Decision Making/ A&P Clinical Course as of 12/08/22 1725  Tue Dec 08, 2022  1256 Patient was unable to stand for orthostatics commenting he was just too dizzy. [MB]  [id number] Patient was a two-person assist to get him up and ambulate.  He said he was very dizzy and he was unsteady. [MB]  1519 Discussed with [facility] hospitalist Dr. Sabino Gasser who will evaluate patient for admission [MB]    Clinical Course User Index [MB] Hayden Rasmussen, MD                             Medical Decision Making Amount and/or Complexity of Data Reviewed Labs: ordered. Radiology: ordered.  Risk Decision regarding hospitalization.   This patient complains of  syncope head  injury scalp lack dizziness poor intake; this involves an extensive number of treatment Options and is a complaint that carries with it a high risk of complications and morbidity. The differential includes dehydration, fracture, bleed, concussion, vertigo, metabolic derangement, arrhythmia  I ordered, reviewed and interpreted labs, which included CBC with normal white count, stable hemoglobin, chemistries with stable elevated creatinine, new elevation of AST ALT, urinalysis without signs of infection, troponins flat, lactate slightly elevated but cleared, alcohol level negative I ordered medication IV fluids and reviewed PMP when indicated. I ordered imaging studies which included head CT and I independently    visualized and interpreted imaging which showed no acute findings Additional history obtained from EMS Previous records obtained and reviewed in epic including recent discharge summary I consulted [facility] hospitalist Dr. Sabino Gasser and discussed lab and imaging findings and discussed disposition.  Cardiac monitoring reviewed, normal sinus rhythm Social determinants considered, no significant barriers Critical Interventions: None  After the interventions stated above, I reevaluated the patient and found patient to be hemodynamically stable even hypertensive and not orthostatic although still very symptomatic with dizziness and unsteady gait Admission and further testing considered, will discuss with hospitalist regarding indications for admission as likely will need further PT input and may need placement         Final Clinical Impression(s) / ED Diagnoses Final diagnoses:  Syncope and collapse  Dizziness  Injury of head, initial encounter  Laceration of scalp, initial encounter    Rx / DC Orders ED Discharge Orders     None         Hayden Rasmussen, MD 12/08/22 1729

## 2022-12-08 NOTE — ED Notes (Signed)
Pt unable to stand for 3 minutes for the last orthostatic. Pt just drank two orange juices.

## 2022-12-08 NOTE — Assessment & Plan Note (Signed)
-   Appointment with the [facility] in July for dental implants - Previously started on Augmentin last hospitalization for a 7-day course, continue this for now

## 2022-12-09 DIAGNOSIS — R42 Dizziness and giddiness: Secondary | ICD-10-CM | POA: Diagnosis not present

## 2022-12-09 DIAGNOSIS — K089 Disorder of teeth and supporting structures, unspecified: Secondary | ICD-10-CM

## 2022-12-09 LAB — CBC WITH DIFFERENTIAL/PLATELET
Abs Immature Granulocytes: 0.02 10*3/uL (ref 0.00–0.07)
Basophils Absolute: 0 10*3/uL (ref 0.0–0.1)
Basophils Relative: 0 %
Eosinophils Absolute: 0.1 10*3/uL (ref 0.0–0.5)
Eosinophils Relative: 1 %
HCT: 29.5 % — ABNORMAL LOW (ref 39.0–52.0)
Hemoglobin: 9.8 g/dL — ABNORMAL LOW (ref 13.0–17.0)
Immature Granulocytes: 0 %
Lymphocytes Relative: 24 %
Lymphs Abs: 1.4 10*3/uL (ref 0.7–4.0)
MCH: 32.2 pg (ref 26.0–34.0)
MCHC: 33.2 g/dL (ref 30.0–36.0)
MCV: 97 fL (ref 80.0–100.0)
Monocytes Absolute: 0.7 10*3/uL (ref 0.1–1.0)
Monocytes Relative: 12 %
Neutro Abs: 3.6 10*3/uL (ref 1.7–7.7)
Neutrophils Relative %: 63 %
Platelets: 229 10*3/uL (ref 150–400)
RBC: 3.04 MIL/uL — ABNORMAL LOW (ref 4.22–5.81)
RDW: 14.7 % (ref 11.5–15.5)
WBC: 5.8 10*3/uL (ref 4.0–10.5)
nRBC: 0 % (ref 0.0–0.2)

## 2022-12-09 LAB — BASIC METABOLIC PANEL
Anion gap: 9 (ref 5–15)
BUN: 16 mg/dL (ref 8–23)
CO2: 22 mmol/L (ref 22–32)
Calcium: 8.7 mg/dL — ABNORMAL LOW (ref 8.9–10.3)
Chloride: 107 mmol/L (ref 98–111)
Creatinine, Ser: 1.37 mg/dL — ABNORMAL HIGH (ref 0.61–1.24)
GFR, Estimated: 57 mL/min — ABNORMAL LOW (ref >60)
Glucose, Bld: 123 mg/dL — ABNORMAL HIGH (ref 70–99)
Potassium: 3.7 mmol/L (ref 3.5–5.1)
Sodium: 138 mmol/L (ref 135–145)

## 2022-12-09 LAB — VITAMIN B1: Vitamin B1 (Thiamine): 126.8 nmol/L (ref 66.5–200.0)

## 2022-12-09 LAB — MAGNESIUM: Magnesium: 1.8 mg/dL (ref 1.7–2.4)

## 2022-12-09 MED ORDER — IRBESARTAN 75 MG PO TABS
75.0000 mg | ORAL_TABLET | Freq: Every day | ORAL | Status: DC
  Administered 2022-12-09: 75 mg via ORAL
  Filled 2022-12-09: qty 1

## 2022-12-09 MED ORDER — VITAMIN B-12 1000 MCG PO TABS
1000.0000 ug | ORAL_TABLET | Freq: Every day | ORAL | Status: DC
  Administered 2022-12-09: 1000 ug via ORAL
  Filled 2022-12-09: qty 1

## 2022-12-09 NOTE — Evaluation (Signed)
Physical Therapy Evaluation Patient Details Name: John Fisher MRN: [id number] DOB: June 24, 1956 Today's Date: 12/09/2022  History of Present Illness  67 yo male admitted to hospital on 12/08/2022 due to fall on 3/25 in which pt struck his head. Pt was unable to perform fall recovery and was on the floor for some undisclosed time then able to crawl to bed prior to EMS arrival. Pt was hospitalized 3/21-3/24 secondary to orthostatic hypotension and alcoholic ketoacidosis. Pt reports limited PO intake and attributes to poor dentition with anticipation of dental implants in July through the [place]. CT negative for acute findings. Pt has PMH including but not limited to HTN, DM II and OA.  Clinical Impression    Pt admitted with above diagnosis. Pt currently with functional limitations due to the deficits listed below (see PT Problem List). Pt is IND for bed mobility, S and cues for transfers, gait tasks with RW min guard 460 feet no evidence of LOB. Pt denies pain and SOB, pt reports dizziness at rest in supine and sitting.  Positive findings for orthostatic hypotension.  Pt will benefit from acute skilled PT to increase their independence and safety with mobility to allow discharge.   BP supine 133/100 (90 PR) Seated EOB 116/73 ( 105 PR) Initial standing 117/95 (108) Standing s/p 3 min 129/99 (110 PR)     Recommendations for follow up therapy are one component of a multi-disciplinary discharge planning process, led by the attending physician.  Recommendations may be updated based on patient status, additional functional criteria and insurance authorization.  Follow Up Recommendations       Assistance Recommended at Discharge Intermittent Supervision/Assistance  Patient can return home with the following  A little help with walking and/or transfers;A little help with bathing/dressing/bathroom;Assistance with cooking/housework;Assist for transportation;Help with stairs or ramp for entrance    Equipment  Recommendations Rolling walker (2 wheels)  Recommendations for Other Services       Functional Status Assessment Patient has had a recent decline in their functional status and demonstrates the ability to make significant improvements in function in a reasonable and predictable amount of time.     Precautions / Restrictions Precautions Precautions: Fall;Other (comment) (orthostatic hypotension) Restrictions Weight Bearing Restrictions: No      Mobility  Bed Mobility Overal bed mobility: Independent                  Transfers Overall transfer level: Needs assistance Equipment used: Rolling walker (2 wheels) Transfers: Sit to/from Stand Sit to Stand: Supervision                Ambulation/Gait Ambulation/Gait assistance: Min guard Gait Distance (Feet): 460 Feet Assistive device: Rolling walker (2 wheels) Gait Pattern/deviations: Step-through pattern Gait velocity: decreased        Stairs            Wheelchair Mobility    Modified Rankin (Stroke Patients Only)       Balance Overall balance assessment: Mild deficits observed, not formally tested (pt tolerated static standing no UE support with wide BOS for 4 mins and lateral sway noted)                                           Pertinent Vitals/Pain Pain Assessment Pain Assessment: No/denies pain    Home Living Family/patient expects to be discharged to:: Private residence Living Arrangements: Children;Alone Available Help  at Discharge: Available PRN/intermittently Type of Home: Apartment         Home Layout: One level Home Equipment: Cane - single point      Prior Function Prior Level of Function : Independent/Modified Independent;Driving             Mobility Comments: reports  gets out, daughter can get food       Hand Dominance   Dominant Hand: Right    Extremity/Trunk Assessment        Lower Extremity Assessment Lower Extremity Assessment:  Overall WFL for tasks assessed    Cervical / Trunk Assessment Cervical / Trunk Assessment: Normal  Communication   Communication: No difficulties  Cognition Arousal/Alertness: Awake/alert Behavior During Therapy: WFL for tasks assessed/performed Overall Cognitive Status: Within Functional Limits for tasks assessed Area of Impairment: Orientation                 Orientation Level: Person, Time, Place                      General Comments General comments (skin integrity, edema, etc.): reports dizziness at rest when in upright sitting    Exercises     Assessment/Plan    PT Assessment Patient needs continued PT services  PT Problem List Decreased activity tolerance;Decreased balance;Decreased mobility;Decreased knowledge of use of DME       PT Treatment Interventions DME instruction;Gait training    PT Goals (Current goals can be found in the Care Plan section)  Acute Rehab PT Goals Patient Stated Goal: to not go home because I know I will fall again PT Goal Formulation: With patient Time For Goal Achievement: 12/23/22 Potential to Achieve Goals: Good    Frequency Min 3X/week     Co-evaluation               AM-PAC PT "6 Clicks" Mobility  Outcome Measure Help needed turning from your back to your side while in a flat bed without using bedrails?: None Help needed moving from lying on your back to sitting on the side of a flat bed without using bedrails?: None Help needed moving to and from a bed to a chair (including a wheelchair)?: A Little Help needed standing up from a chair using your arms (e.g., wheelchair or bedside chair)?: A Little Help needed to walk in hospital room?: A Little Help needed climbing 3-5 steps with a railing? : A Lot 6 Click Score: 19    End of Session Equipment Utilized During Treatment: Gait belt Activity Tolerance: Patient tolerated treatment well Patient left: in chair;with call bell/phone within reach;with chair  alarm set;Other (comment) (MD present)   PT Visit Diagnosis: Unsteadiness on feet (R26.81);Repeated falls (R29.6);Dizziness and giddiness (R42)    Time: [id number] PT Time Calculation (min) (ACUTE ONLY): 29 min   Charges:   PT Evaluation $PT Eval Low Complexity: 1 Low PT Treatments $Gait Training: 8-22 mins       Baird Lyons, PT   Adair Patter 12/09/2022, 12:34 PM

## 2022-12-09 NOTE — TOC Transition Note (Signed)
Transition of Care [facility]) - CM/SW Discharge Note   Patient Details  Name: John Fisher MRN: [id number] Date of Birth: [birth year]  [facility] CM/SW Contact:  Vassie Moselle, LCSW Phone Number: 12/09/2022, 12:39 PM   Clinical Narrative:    Met with pt and confirmed plan for home health PT and need for RW. Pt has no agency preference for [facility].  RW has been ordered through [employer] and will be delivered to pt's room prior to discharge.  HHPT has been arranged with [facility]. HH order in place.   Update 2:05pm- CSW informed pt declined RW due to not being able to afford it.   Final next level of care: [place]     Patient Goals and CMS Choice CMS Medicare.gov Compare Post Acute Care list provided to:: Patient Choice offered to / list presented to : Patient  Discharge Placement                         Discharge Plan and Services Additional resources added to the After Visit Summary for                  DME Arranged: Walker rolling DME Agency: [employer] Date DME Agency Contacted: 12/09/22 Time DME Agency Contacted: 1238 Representative spoke with at DME Agency: Erasmo Downer HH Arranged: PT [place]: [place] Date [place]: 12/09/22 Time [place]: 1239 Representative spoke with at [place]: [place] ([place]) Interventions SDOH Screenings   Food Insecurity: No Food Insecurity (12/08/2022)  Housing: Low Risk  (12/08/2022)  Transportation Needs: No Transportation Needs (12/08/2022)  Utilities: Not At Risk (12/08/2022)  Tobacco Use: Low Risk  (12/08/2022)     Readmission Risk Interventions     No data to display

## 2022-12-09 NOTE — Plan of Care (Signed)

## 2022-12-09 NOTE — Discharge Summary (Signed)
Physician Discharge Summary   John Fisher [id number] DOB: 1955/10/01 DOA: 12/08/2022  PCP: [facility]  Admit date: 12/08/2022 Discharge date: 12/09/2022   Admitted From: Home Disposition:  Home Discharging physician: John Dee, MD Barriers to discharge: none  Recommendations for Outpatient Follow-up:  Patient referred to neuro per daughter request  Home Health: PT DME: Patient declined RW  Discharge Condition: stable CODE STATUS: Full Diet recommendation:  Diet Orders (From admission, onward)     Start     Ordered   12/08/22 1545  DIET - DYS 1 Room service appropriate? Yes; Fluid consistency: Thin  Diet effective now       Question Answer Comment  Room service appropriate? Yes   Fluid consistency: Thin      12/08/22 1544            Hospital Course: John Fisher is a 67 yo male with PMH DMII, HTN, OA, alcohol use who presents with dizziness and a recurrent fall at home. Patient states that he fell over in the kitchen on 12/07/2022 and laid on his back for approximately a couple hours.  He then crawled on his back to bed.  Due to dizziness and bleeding from his back scalp, he was unable to stand.  Ultimately, EMS brought patient to the ER.  Patient just hopspitalized from 3/21 to 3/24 for orthostatic hypotension and alcoholic ketoacidosis.  He also attributes his dizziness due to poor oral intake from extremely poor dentition and is awaiting dental implants in July 2024 through the [facility].  He has been trying soft foods at home although has difficulty keeping up with nutrition at times and does not drink enough fluids either he says. He was hydrated last admission and ambulated prior to discharge although still had some dizziness. He declined a walker at discharge but now states he would attempt using one at discharge this time around.  He attempted orthostatics in the ER and was unable to stand up due to already developing dizziness when sitting.  He is  therefore admitted for further fluid rehydration and reattempt of orthostatics along with repeat PT evaluation.  CT head on admission was negative for acute abnormalities. Lab work rather unremarkable.  Mildly elevated lactic, 2 considered in setting of dehydration.  He was fluid rehydrated and had improvement in blood pressure the following morning.  He did remain very mildly orthostatic when transitioning from supine to sitting but had recovery of blood pressure after standing.  He was recommended for slow position changes at home.  He was also evaluated by physical therapy with recommendations for home health and rolling walker.  He was able to ambulate adequately with minimal guard 460 feet with rolling walker prior to discharge.  Assessment and Plan: * Dizziness - Patient states this has been an issue for at least 2 years.  Negative CT head on admission.  Would suspect underlying stroke to be evident at this time.  Patient seems to think his dizziness is related to his poor intake from poor dentition.  Patient also was noted to be orthostatic prior admission a few days prior as well - mildly orthostatic with sitting but recovers with standing; patient instructed to stand slowly upon d/c and continue rehydration with liquids until able to tolerate more solids - ambulated well with PT; patient declined RW prior to d/c due to cost - daughter requested neuro referral as well upon discharge  Poor dentition - Appointment with the [facility] in July for dental implants - Previously  started on Augmentin last hospitalization for a 7-day course, continue this for now  Hypertension - BP recovered; if recurrent hypotension in the future, may need further adjustment of regimen  DMII (diabetes mellitus, type 2) ([facility]) - last A1c 5.3% in 08/24/22 - diet control okay for now; on metformin at home    The patient's chronic medical conditions were treated accordingly per the patient's home medication regimen  except as noted.  On day of discharge, patient was felt deemed stable for discharge. Patient/family member advised to call PCP or come back to ER if needed.   Principal Diagnosis: Dizziness  Discharge Diagnoses: Active Hospital Problems   Diagnosis Date Noted   Dizziness 12/08/2022    Priority: 1.   Poor dentition 08/25/2022   Hypertension 02/10/2011   DMII (diabetes mellitus, type 2) ([place]) 12/11/2008    Resolved Hospital Problems  No resolved problems to display.     Discharge Instructions     Ambulatory referral to Neurology   Complete by: As directed    An appointment is requested in approximately: 4 weeks   Increase activity slowly   Complete by: As directed       Allergies as of 12/09/2022       Reactions   Ensure Other (See Comments)   Constipation   Trospium Other (See Comments)   Retention of urine   Alfuzosin Anxiety, Other (See Comments)   Increased frequency of urination, also        Medication List     TAKE these medications    amLODipine 5 MG tablet Commonly known as: NORVASC Take 5 mg by mouth daily.   amoxicillin-clavulanate 875-125 MG tablet Commonly known as: AUGMENTIN Take 1 tablet by mouth 2 (two) times daily for 7 days.   aspirin EC 81 MG tablet Take 81 mg by mouth daily.   atorvastatin 20 MG tablet Commonly known as: LIPITOR Take 20 mg by mouth 2 (two) times a week.   cholecalciferol 25 MCG (1000 UNIT) tablet Commonly known as: VITAMIN D3 Take 1,000 Units by mouth daily.   cyanocobalamin 1000 MCG tablet Commonly known as: VITAMIN B12 Take 1,000 mcg by mouth daily.   DULoxetine 60 MG capsule Commonly known as: CYMBALTA Take 60 mg by mouth daily.   feeding supplement Liqd Take 237 mLs by mouth 2 (two) times daily between meals.   finasteride 5 MG tablet Commonly known as: PROSCAR Take 5 mg by mouth daily.   fluticasone 50 MCG/ACT nasal spray Commonly known as: FLONASE Place 1 spray into both nostrils 2 (two) times  daily as needed for allergies or rhinitis. What changed:  how much to take when to take this   folic acid 1 MG tablet Commonly known as: FOLVITE Take 1 tablet (1 mg total) by mouth daily.   gabapentin 100 MG capsule Commonly known as: NEURONTIN Take 1 capsule (100 mg total) by mouth 2 (two) times daily.   metFORMIN 500 MG tablet Commonly known as: GLUCOPHAGE Take 500 mg by mouth daily with breakfast.   naloxone 4 MG/0.1ML Liqd nasal spray kit Commonly known as: NARCAN Place 1 spray into the nose once as needed (for accidental overdose).   traMADol 50 MG tablet Commonly known as: ULTRAM Take 1 tablet (50 mg total) by mouth every 6 (six) hours as needed for moderate pain or severe pain.   valsartan 160 MG tablet Commonly known as: DIOVAN Take 80 mg by mouth in the morning.   Vitamin B Complex Tabs Take 1 tablet  by mouth daily.   Voltaren 1 % Gel Generic drug: diclofenac Sodium Apply 2 g topically 4 (four) times daily as needed (for pain- affected areas).               Durable Medical Equipment  (From admission, onward)           Start     Ordered   12/09/22 1523  For home use only DME Walker rolling  Once       Question Answer Comment  Walker: With [place] Wheels   Patient needs a walker to treat with the following condition Unsteadiness on feet      12/09/22 1525   12/09/22 1224  For home use only DME Walker rolling  Once       Question Answer Comment  Walker: With 5 Inch Wheels   Patient needs a walker to treat with the following condition Generalized muscle weakness      12/09/22 1223            Follow-up Information     [facility] Follow up.   Why: [facility] will follow up with you at discharge to provide home health physical therapy.               Allergies  Allergen Reactions   Ensure Other (See Comments)    Constipation    Trospium Other (See Comments)    Retention of urine   Alfuzosin Anxiety and Other (See  Comments)    Increased frequency of urination, also    Consultations:   Procedures:   Discharge Exam: BP (!) 151/104 (BP Location: Right Arm)   Pulse 96   Temp 98.2 F (36.8 C)   Resp 20   Ht 6' (1.829 m)   Wt 80 kg   SpO2 100%   BMI 23.92 kg/m  Physical Exam Constitutional:      General: He is not in acute distress.    Appearance: Normal appearance.  HENT:     Head: Normocephalic.     Comments: No change in prior scalp abrasions and no increase in swelling (mild)    Mouth/Throat:     Mouth: Mucous membranes are moist.     Comments: Poor dentition Eyes:     Extraocular Movements: Extraocular movements intact.  Cardiovascular:     Rate and Rhythm: Normal rate and regular rhythm.     Heart sounds: Normal heart sounds.  Pulmonary:     Effort: Pulmonary effort is normal. No respiratory distress.     Breath sounds: Normal breath sounds. No wheezing.  Abdominal:     General: Bowel sounds are normal. There is no distension.     Palpations: Abdomen is soft.     Tenderness: There is no abdominal tenderness.  Musculoskeletal:        General: Normal range of motion.     Cervical back: Normal range of motion and neck supple.  Skin:    General: Skin is warm and dry.  Neurological:     General: No focal deficit present.     Mental Status: He is alert.  Psychiatric:        Mood and Affect: Mood normal.        Behavior: Behavior normal.      The results of significant diagnostics from this hospitalization (including imaging, microbiology, ancillary and laboratory) are listed below for reference.   Microbiology: No results found for this or any previous visit (from the past 240 hour(s)).   Labs:  BNP (last 3 results) No results for input(s): "BNP" in the last 8760 hours. Basic Metabolic Panel: Recent Labs  Lab 12/03/22 2137 12/04/22 0730 12/05/22 0529 12/06/22 0449 12/08/22 0930 12/09/22 0646  NA  --  136 137 136 138 138  K  --  4.5 3.6 3.9 4.0 3.7  CL  --   106 104 104 103 107  CO2  --  23 24 25 23 22   GLUCOSE  --  100* 118* 146* 132* 123*  BUN  --  24* 16 15 20 16   CREATININE  --  1.55* 1.39* 1.54* 1.75* 1.37*  CALCIUM  --  8.4* 8.7* 8.9 9.3 8.7*  MG 1.8  --  1.8 1.7  --  1.8  PHOS 5.3*  --  2.8 3.2  --   --    Liver Function Tests: Recent Labs  Lab 12/03/22 1515 12/08/22 0930  AST 34 96*  ALT 20 51*  ALKPHOS 55 55  BILITOT 0.6 0.8  PROT 7.9 7.8  ALBUMIN 3.8 3.6   No results for input(s): "LIPASE", "AMYLASE" in the last 168 hours. No results for input(s): "AMMONIA" in the last 168 hours. CBC: Recent Labs  Lab 12/03/22 1515 12/04/22 0730 12/05/22 0529 12/06/22 0449 12/08/22 0930 12/09/22 0646  WBC 14.2* 9.3 7.3 7.9 8.8 5.8  NEUTROABS 13.2*  --   --   --  6.3 3.6  HGB 12.4* 10.6* 10.9* 10.6* 11.1* 9.8*  HCT 39.8 32.0* 33.2* 31.7* 33.6* 29.5*  MCV 105.9* 98.8 98.2 96.9 97.4 97.0  PLT 255 209 203 196 256 229   Cardiac Enzymes: No results for input(s): "CKTOTAL", "CKMB", "CKMBINDEX", "TROPONINI" in the last 168 hours. BNP: Invalid input(s): "POCBNP" CBG: Recent Labs  Lab 12/05/22 0813 12/05/22 1159 12/05/22 1648 12/05/22 2108 12/06/22 0803  GLUCAP 165* 137* 114* 124* 158*   D-Dimer No results for input(s): "DDIMER" in the last 72 hours. Hgb A1c No results for input(s): "HGBA1C" in the last 72 hours. Lipid Profile No results for input(s): "CHOL", "HDL", "LDLCALC", "TRIG", "CHOLHDL", "LDLDIRECT" in the last 72 hours. Thyroid function studies No results for input(s): "TSH", "T4TOTAL", "T3FREE", "THYROIDAB" in the last 72 hours.  Invalid input(s): "FREET3" Anemia work up No results for input(s): "VITAMINB12", "FOLATE", "FERRITIN", "TIBC", "IRON", "RETICCTPCT" in the last 72 hours. Urinalysis    Component Value Date/Time   COLORURINE YELLOW 12/08/2022 1306   APPEARANCEUR CLEAR 12/08/2022 1306   LABSPEC 1.014 12/08/2022 1306   PHURINE 6.0 12/08/2022 1306   GLUCOSEU NEGATIVE 12/08/2022 1306   GLUCOSEU  NEGATIVE 02/15/2013 1635   HGBUR NEGATIVE 12/08/2022 1306   BILIRUBINUR NEGATIVE 12/08/2022 1306   KETONESUR 5 (A) 12/08/2022 1306   PROTEINUR NEGATIVE 12/08/2022 1306   UROBILINOGEN 0.2 02/15/2013 1635   NITRITE NEGATIVE 12/08/2022 1306   LEUKOCYTESUR NEGATIVE 12/08/2022 1306   Sepsis Labs Recent Labs  Lab 12/05/22 0529 12/06/22 0449 12/08/22 0930 12/09/22 0646  WBC 7.3 7.9 8.8 5.8   Microbiology No results found for this or any previous visit (from the past 240 hour(s)).  Procedures/Studies: CT Head Wo Contrast  Result Date: 12/08/2022 CLINICAL DATA:  Head trauma, minor (Age >= 65y) EXAM: CT HEAD WITHOUT CONTRAST TECHNIQUE: Contiguous axial images were obtained from the base of the skull through the vertex without intravenous contrast. RADIATION DOSE REDUCTION: This exam was performed according to the departmental dose-optimization program which includes automated exposure control, adjustment of the mA and/or kV according to patient size and/or use of iterative reconstruction technique. COMPARISON:  CT head  08/24/2022. FINDINGS: Brain: No evidence of acute infarction, hemorrhage, hydrocephalus, extra-axial collection or mass lesion/mass effect. Vascular: No hyperdense vessel. Skull: No acute fracture. Sinuses/Orbits: Clear sinuses.  No acute orbital findings. Other: No mastoid effusions. IMPRESSION: No evidence of acute intracranial abnormality. Electronically Signed   By: Margaretha Sheffield M.D.   On: 12/08/2022 10:27     Time coordinating discharge: Over 30 minutes    John Dee, MD  [facility] 12/09/2022, 5:02 PM

## 2022-12-10 IMAGING — CT CT HEAD W/O CM
4 series · 15 of 47 positions shown, 17 images · non-contrast
Comparison: None Available.

CLINICAL DATA: Head trauma, minor (Age >= 65y); Neck trauma (Age >=
65y)



[Series 3: head without · axial · non-contrast · 0.44mm/px · z∈[-62,+58]mm · 7 of 34 slices shown, 9 images]
[im 5/34  brain]
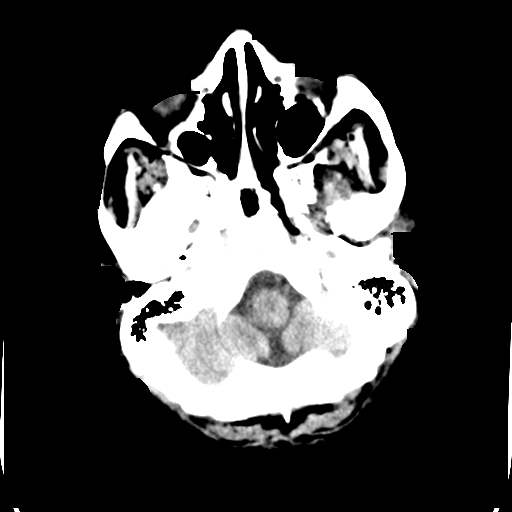
[im 5/34  bone]
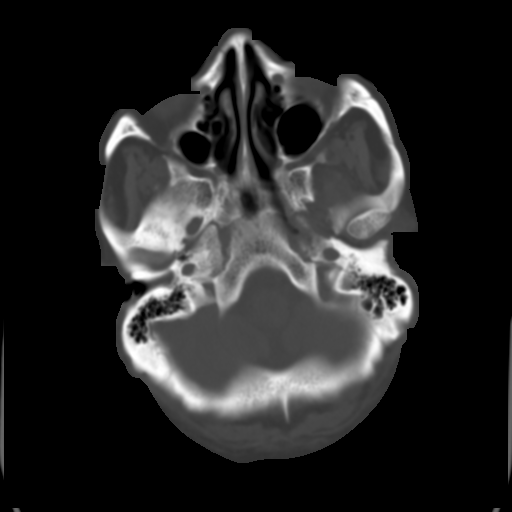
[im 9/34  brain]
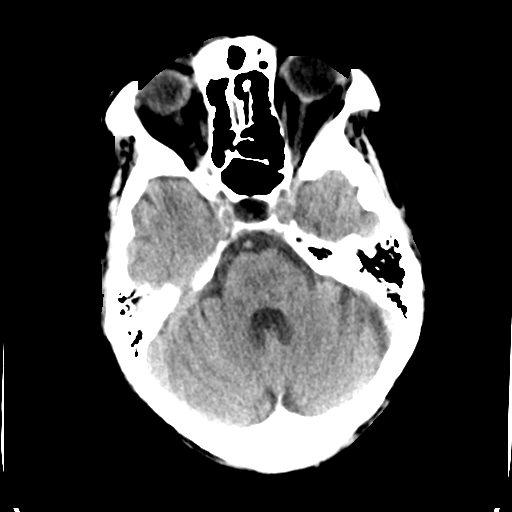
[im 13/34  brain]
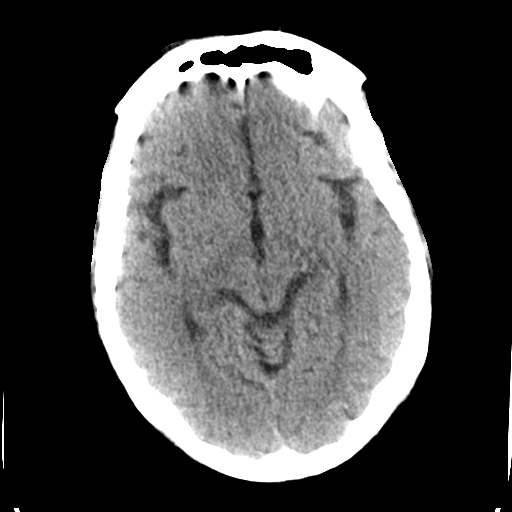
[im 17/34  brain]
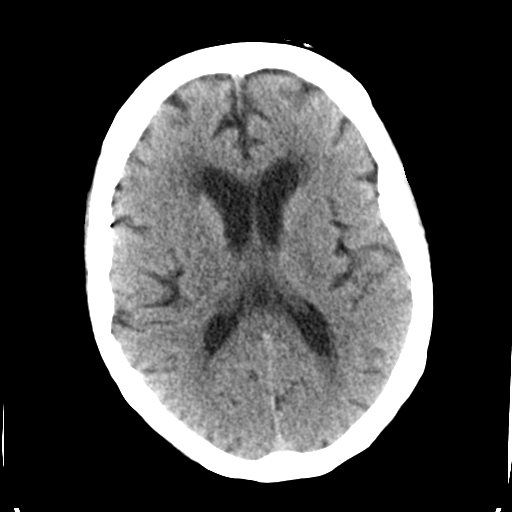
[im 21/34  brain]
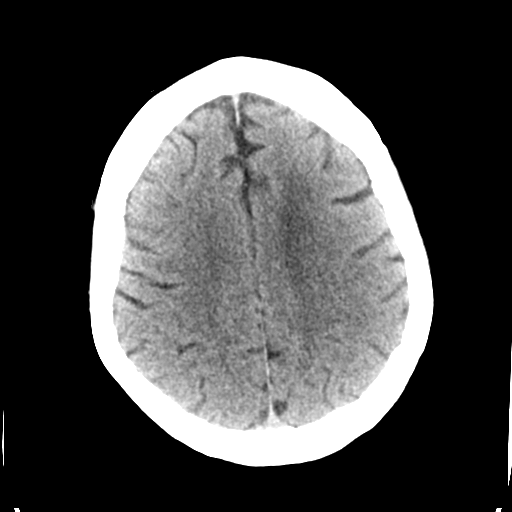
[im 21/34  bone]
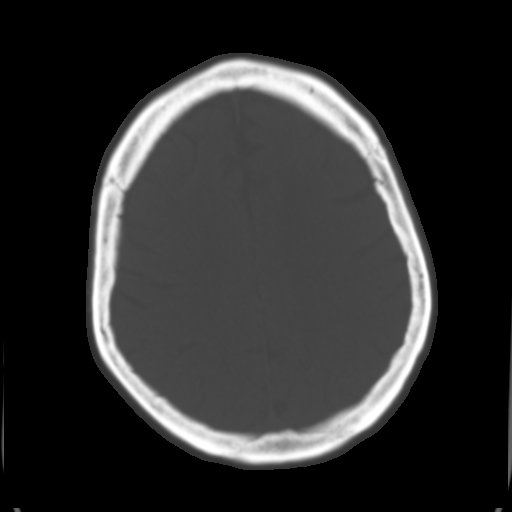
[im 25/34  brain]
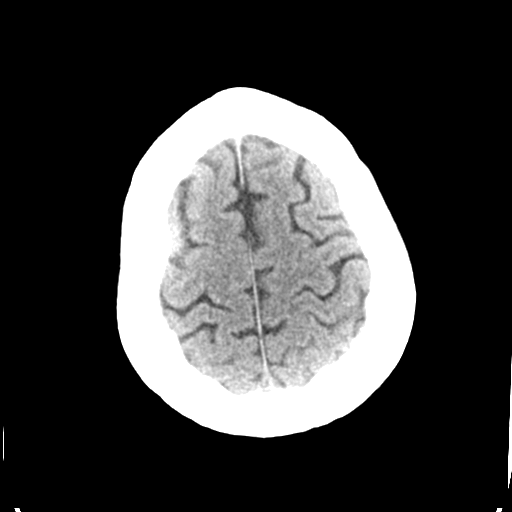
[im 29/34  brain]
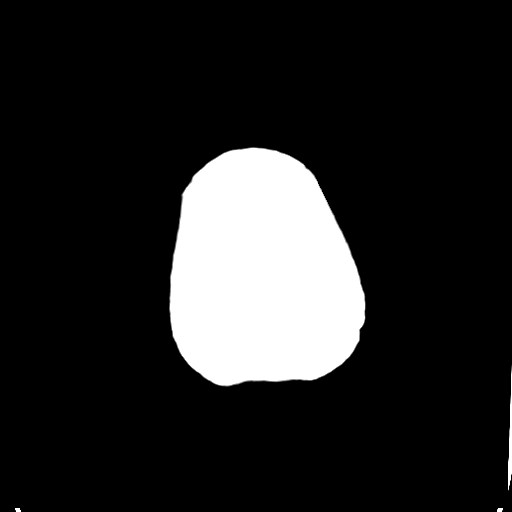

[Series 4: head bone · axial · 0.44mm/px · z∈[-66,-50]mm · 2 of 83 slices shown]
[im 9/83  bone]
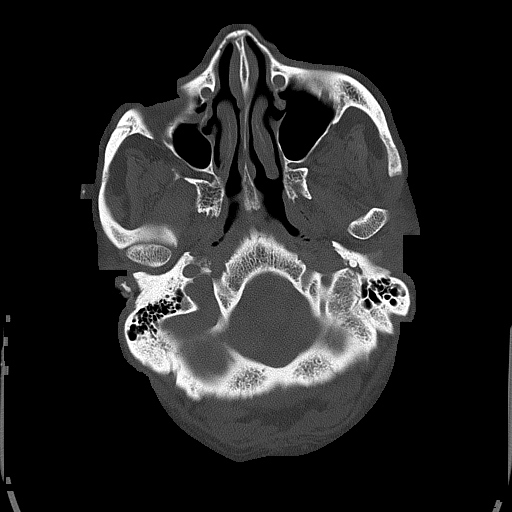
[im 17/83  bone]
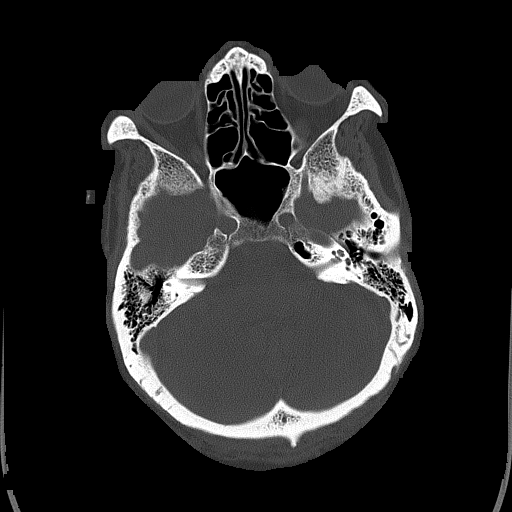

[Series 5: head without cor · coronal · non-contrast · 0.32mm/px · 3 of 72 slices shown]
[im 24/72  brain]
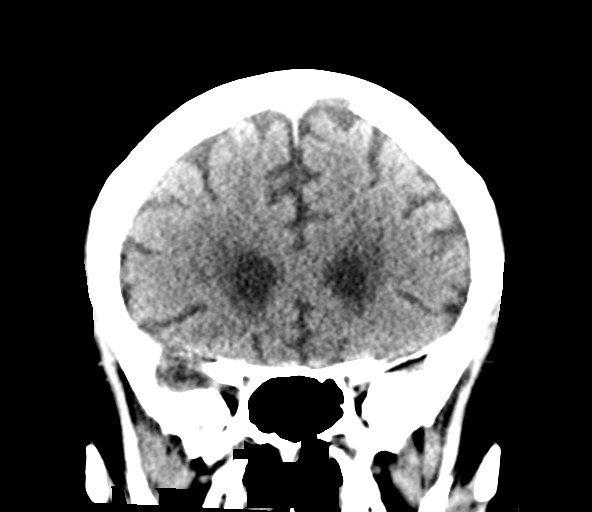
[im 32/72  brain]
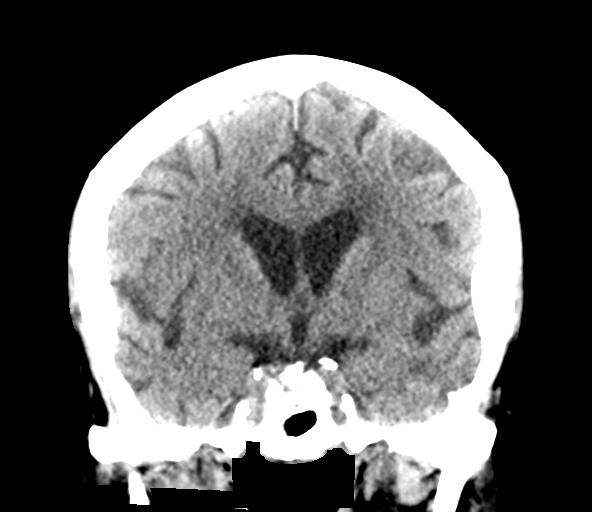
[im 40/72  brain]
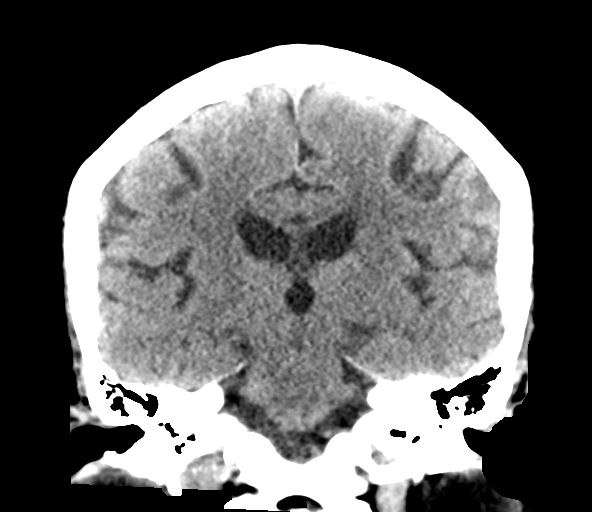

[Series 6: head without sag · sagittal · non-contrast · 0.32mm/px · 3 of 59 slices shown]
[im 20/59  brain]
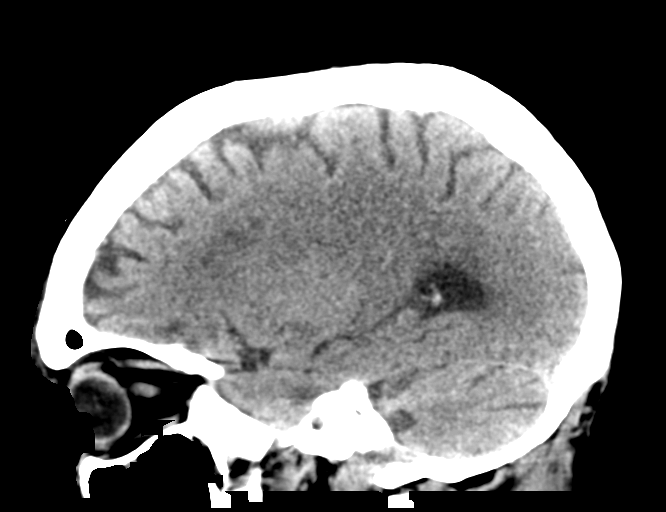
[im 30/59  brain]
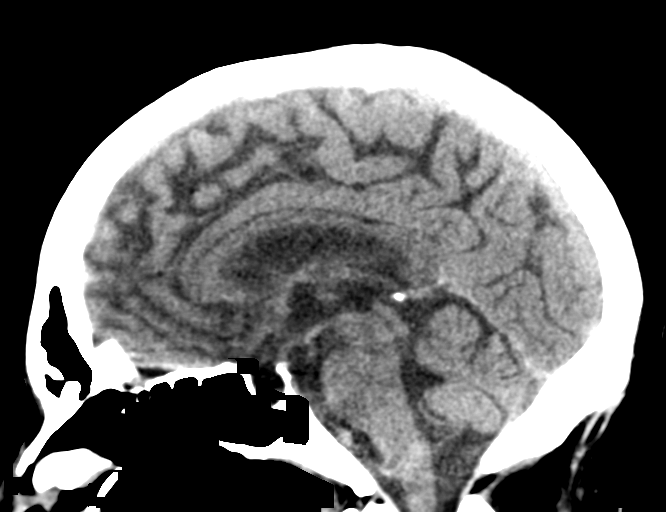
[im 39/59  brain]
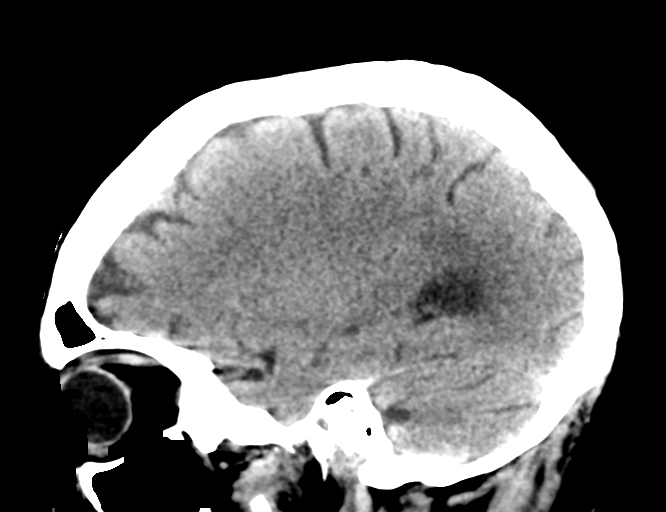

[15 of 47 positions shown; findings below may reference images not displayed]

FINDINGS: CT HEAD FINDINGS

Brain: No evidence of acute infarction, hemorrhage, hydrocephalus,
extra-axial collection or mass lesion/mass effect. Patchy white
matter hypodensities, nonspecific but compatible with chronic
microvascular ischemic disease. Partially empty sella.

Vascular: No evidence of hyperdense vessel.

Skull: No acute fracture.

Sinuses/Orbits: Small retention cysts in the maxillary sinuses.
Otherwise clear sinuses.

Other: No mastoid effusions.

CT CERVICAL SPINE FINDINGS

Alignment: Straightening of the normal cervical lordosis. No
substantial sagittal subluxation.

Skull base and vertebrae: Vertebral body heights are maintained. No
evidence of acute fracture.

Soft tissues and spinal canal: Visualized lung apices are clear.

Disc levels: Moderate multilevel degenerative change including disc
height loss and endplate spurring. Multilevel facet and
uncovertebral hypertrophy with varying degrees of neural foraminal
stenosis.

Upper chest: Visualized lung apices are clear
IMPRESSION: 1. No evidence of acute intracranial abnormality.
2. No evidence of acute fracture or traumatic malalignment.

## 2022-12-10 IMAGING — CT CT ANGIO CHEST
3 of 7 series · 18 of 36 positions shown · IV contrast (agent unspecified)
Comparison: None Available.

CLINICAL DATA: Pulmonary embolism (PE) suspected, high prob

Syncopal episode.
EXAM:
CT ANGIOGRAPHY CHEST WITH CONTRAST
TECHNIQUE: Multidetector CT imaging of the chest was performed using the
standard protocol during bolus administration of intravenous
contrast. Multiplanar CT image reconstructions and MIPs were
obtained to evaluate the vascular anatomy.

[Series 6: pe thins · axial · 0.90mm/px · z∈[+1131,+1378]mm · 15 of 405 slices shown]
[im 26/405  lung]
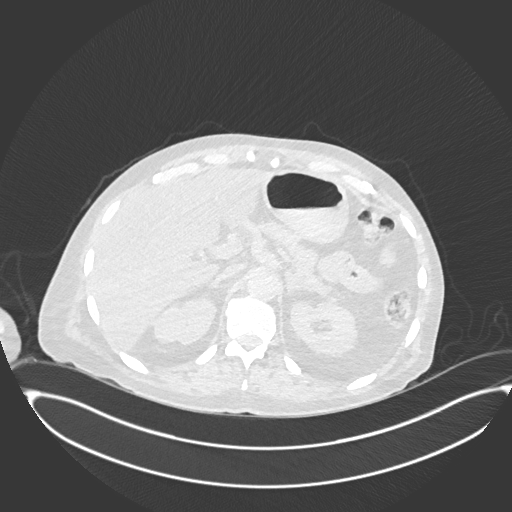
[im 51/405  mediastinal]
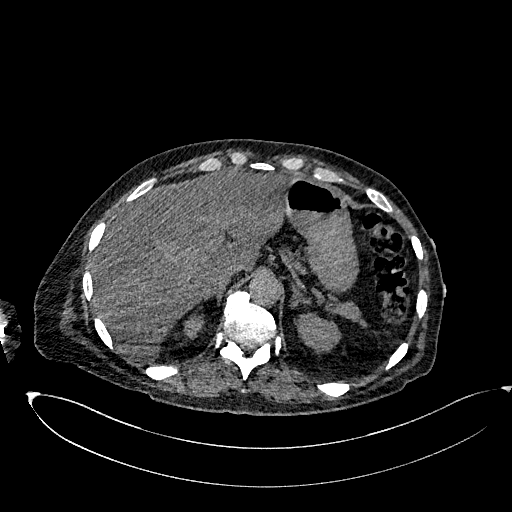
[im 76/405  lung]
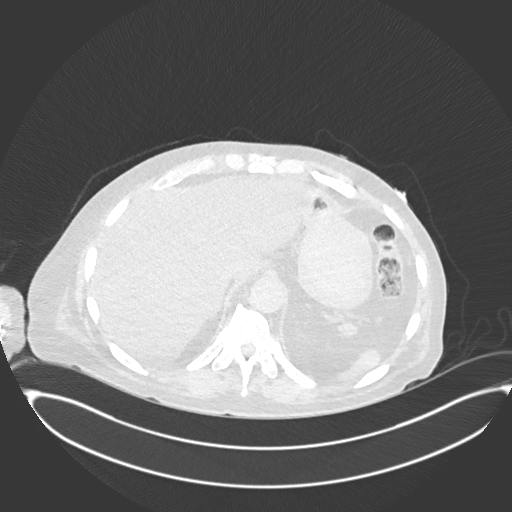
[im 102/405  mediastinal]
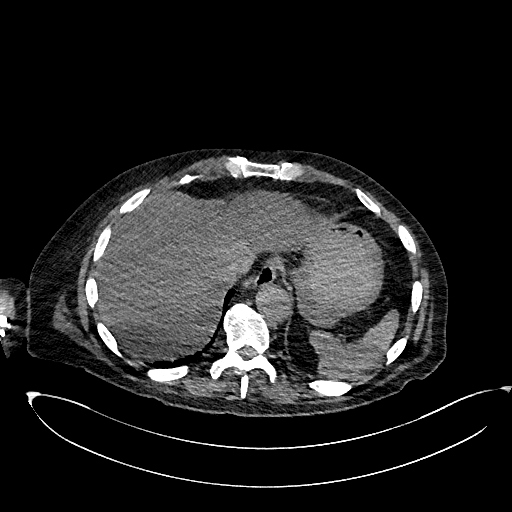
[im 127/405  lung]
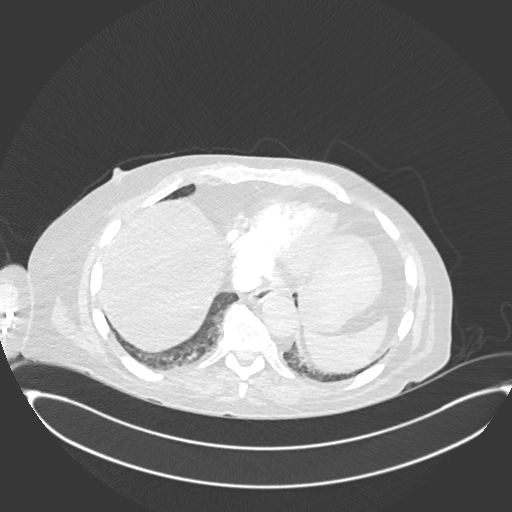
[im 152/405  mediastinal]
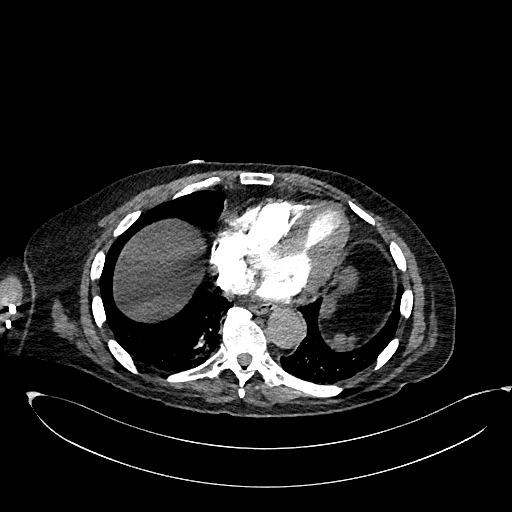
[im 177/405  lung]
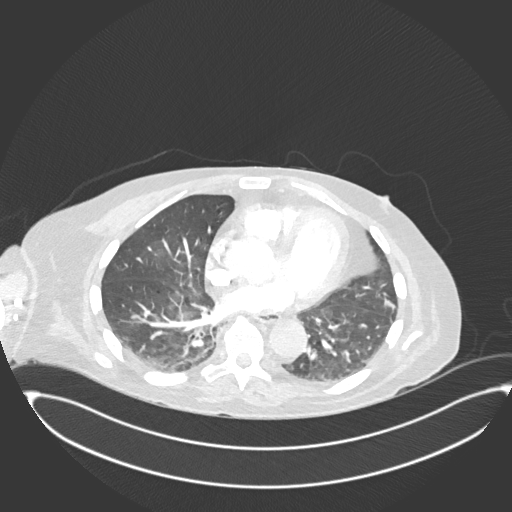
[im 203/405  mediastinal]
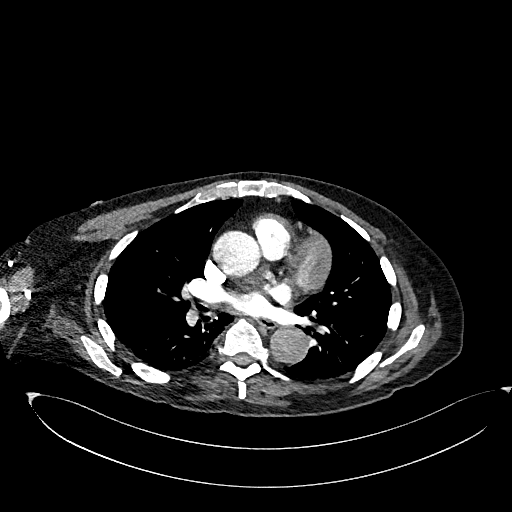
[im 228/405  lung]
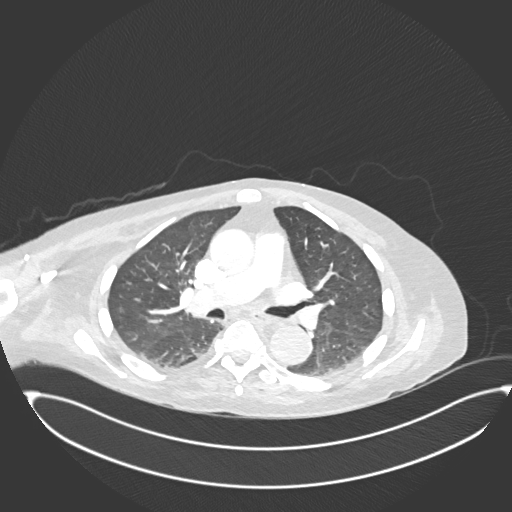
[im 253/405  mediastinal]
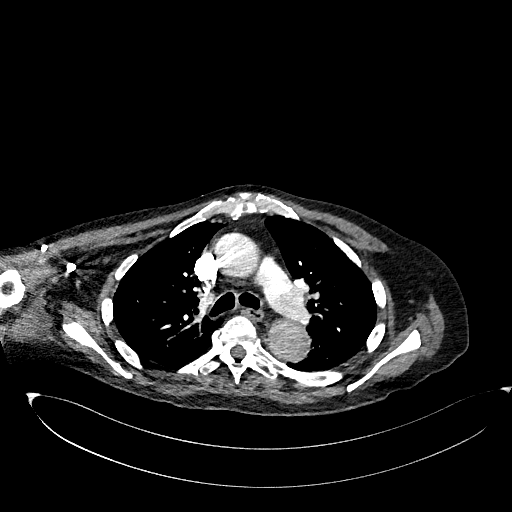
[im 278/405  lung]
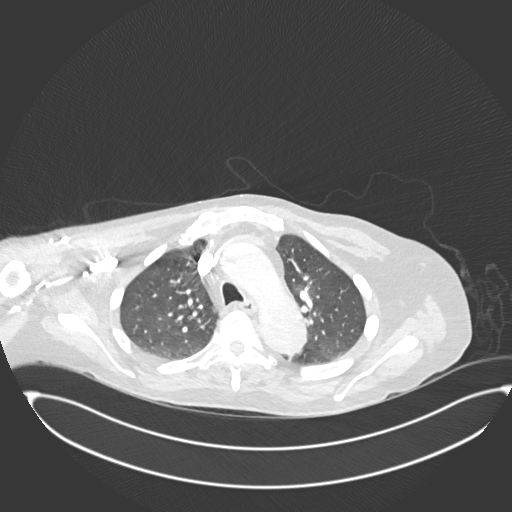
[im 304/405  mediastinal]
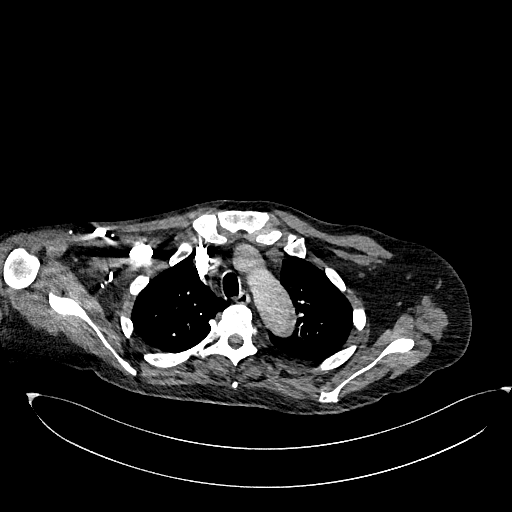
[im 329/405  lung]
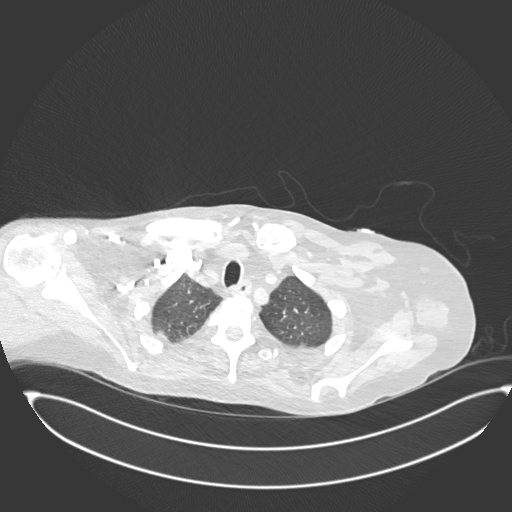
[im 354/405  mediastinal]
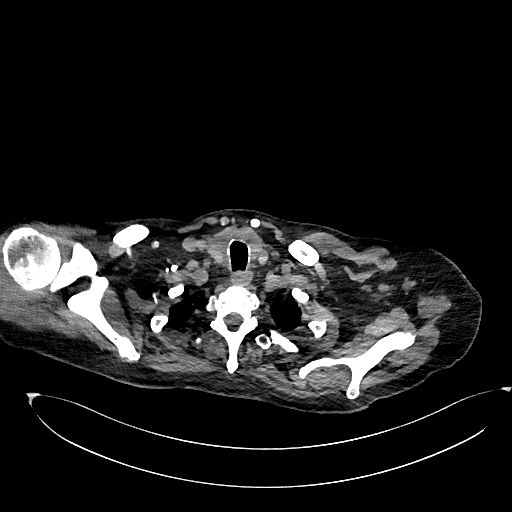
[im 379/405  lung]
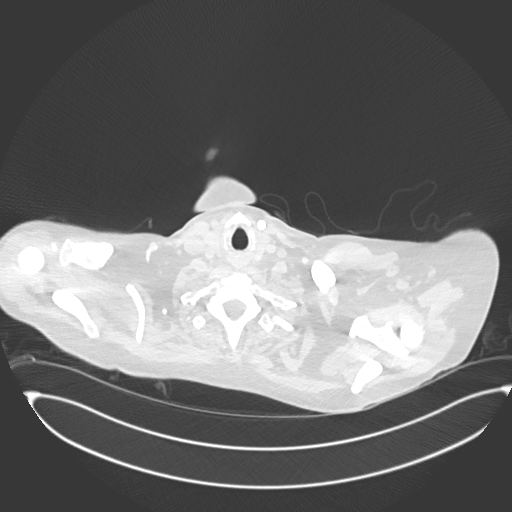

[Series 7: pe lung · axial · 0.71mm/px · z∈[+1228,+1284]mm · 2 of 113 slices shown]
[im 29/113  mediastinal]
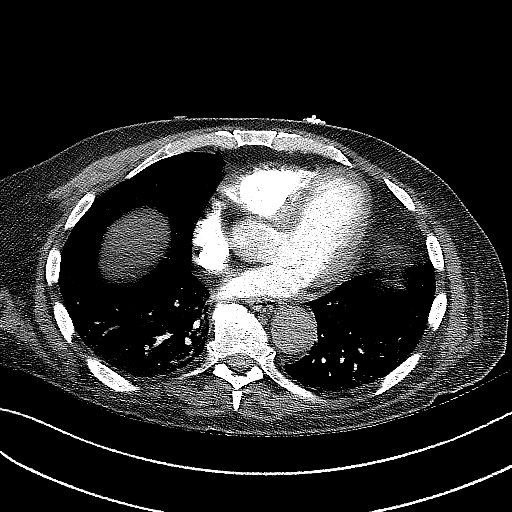
[im 57/113  mediastinal]
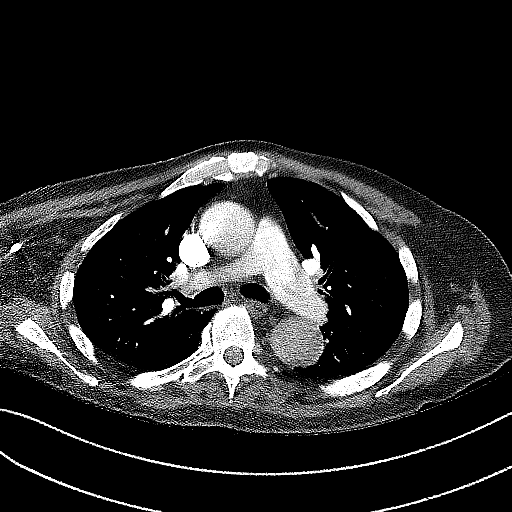

[Series 8: pe 2mm cor · coronal · 0.55mm/px · 1 of 148 slices shown]
[im 74/148  mediastinal]
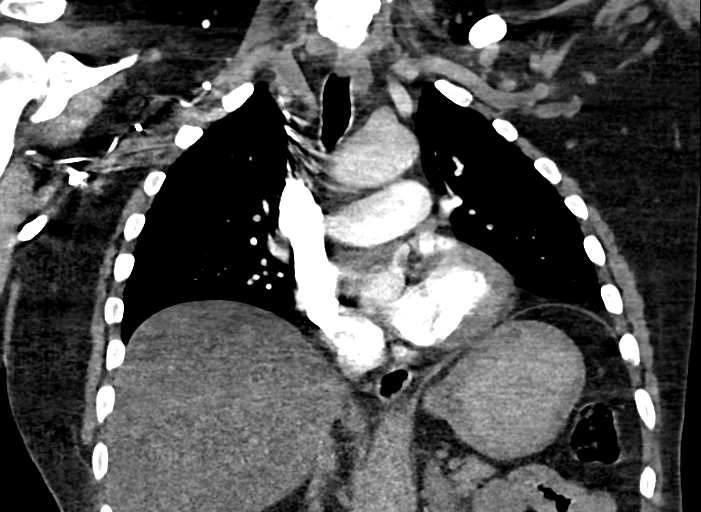

[18 of 36 positions shown; findings below may reference images not displayed]

RADIATION DOSE REDUCTION: This exam was performed according to the
departmental dose-optimization program which includes automated
exposure control, adjustment of the mA and/or kV according to
patient size and/or use of iterative reconstruction technique.

CONTRAST:  75mL OMNIPAQUE IOHEXOL 350 MG/ML SOLN
FINDINGS: Cardiovascular: Suboptimal contrast bolus timing for pulmonary
artery assessment. Aorta of the contrast is within the pulmonary
venous system. Examination is diagnostic to the proximal segmental
level, no evidence of pulmonary embolus. The thoracic aorta is
normal in caliber. Common origin of the brachiocephalic and left
common carotid artery, variant arch anatomy. Heart is normal in
size. Occasional coronary artery calcifications. No pericardial
effusion.

Mediastinum/Nodes: No mediastinal or hilar adenopathy. No esophageal
wall thickening. No thyroid nodule.

Lungs/Pleura: Hypoventilatory changes in the dependent lungs. No
confluent consolidation. There is a 5 mm perifissural nodule in the
right middle lobe, series 7, image 67. No pleural fluid. The trachea
and central bronchi are patent.

Upper Abdomen: Suspected hepatic steatosis. No acute upper abdominal
findings.

Musculoskeletal: Thoracic spondylosis with anterior spurring. There
are no acute or suspicious osseous abnormalities. No chest wall soft
tissue abnormalities.

Review of the MIP images confirms the above findings.
IMPRESSION: 1. Suboptimal contrast bolus timing for pulmonary artery assessment.
No central pulmonary embolus to the proximal segmental level.
2. Hypoventilatory changes in the dependent lungs.
3. A 5 mm perifissural nodule in the right middle lobe. No routine
follow-up imaging is recommended per [HOSPITAL] Guidelines.
These guidelines do not apply to immunocompromised patients and
patients with cancer. Follow up in patients with significant
comorbidities as clinically warranted. For lung cancer screening,
adhere to Lung-RADS guidelines. Reference: Radiology. 2410;

## 2022-12-10 IMAGING — CT CT CERVICAL SPINE W/O CM
3 of 4 series · 13 of 33 positions shown, 16 images · non-contrast
Comparison: None Available.

CLINICAL DATA: Head trauma, minor (Age >= 65y); Neck trauma (Age >=
65y)



[Series 4: c_spine 2.0 st · axial · 0.41mm/px · z∈[-183,-79]mm · 5 of 78 slices shown, 7 images]
[im 13/78  soft-tissue]
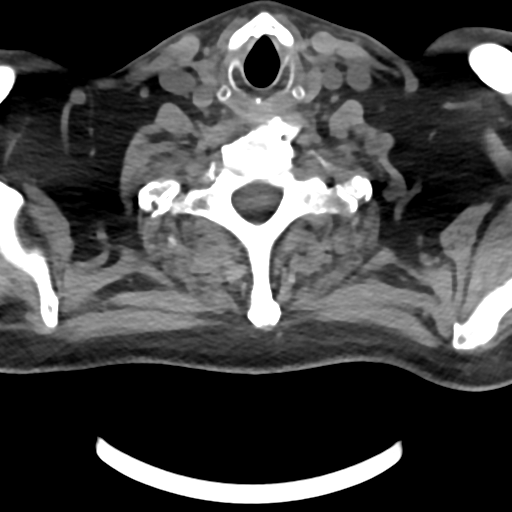
[im 13/78  bone]
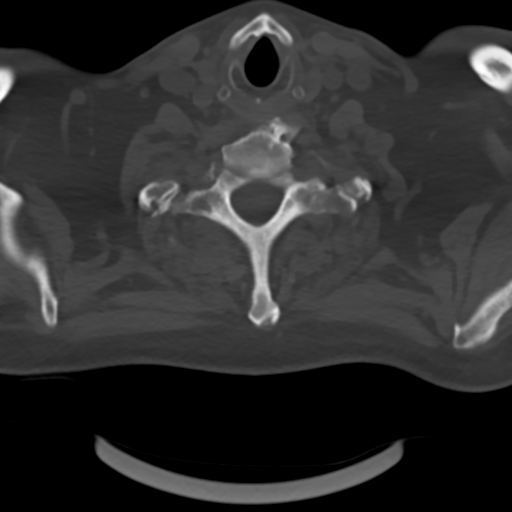
[im 26/78  bone]
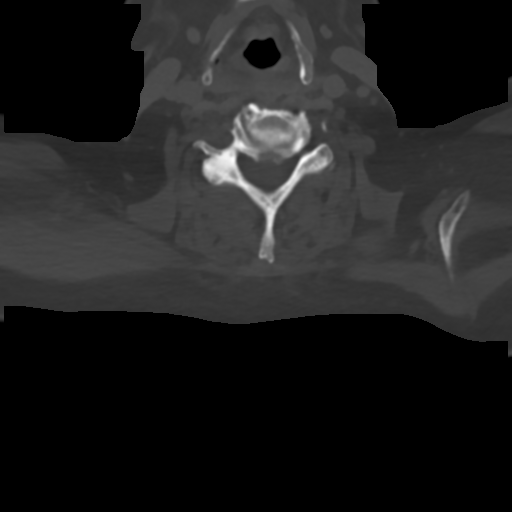
[im 39/78  bone]
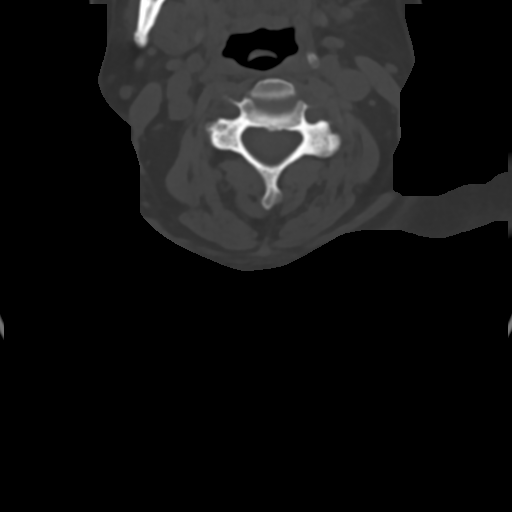
[im 52/78  bone]
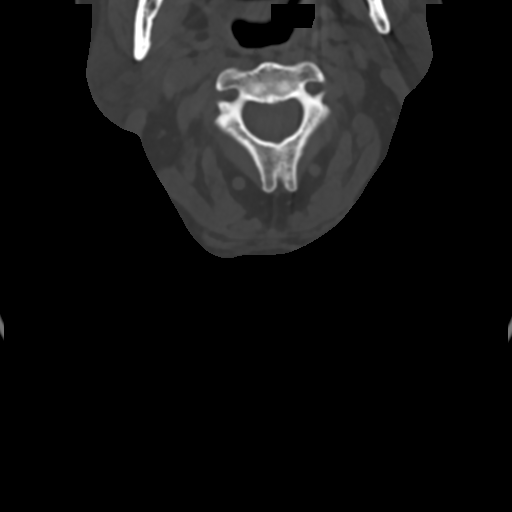
[im 65/78  soft-tissue]
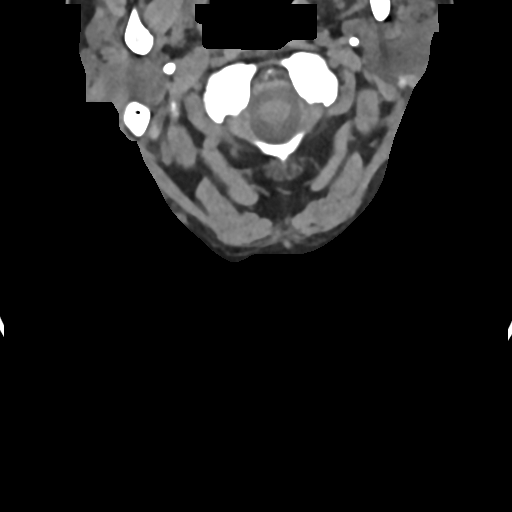
[im 65/78  bone]
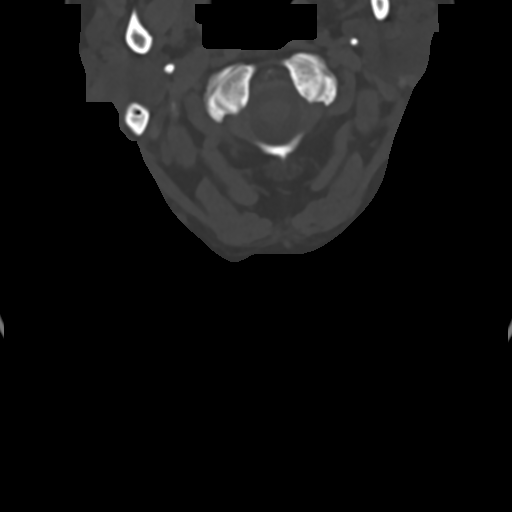

[Series 8: c_spine 2.0 sag bone · sagittal · 0.26mm/px · 5 of 63 slices shown, 6 images]
[im 21/63  bone]
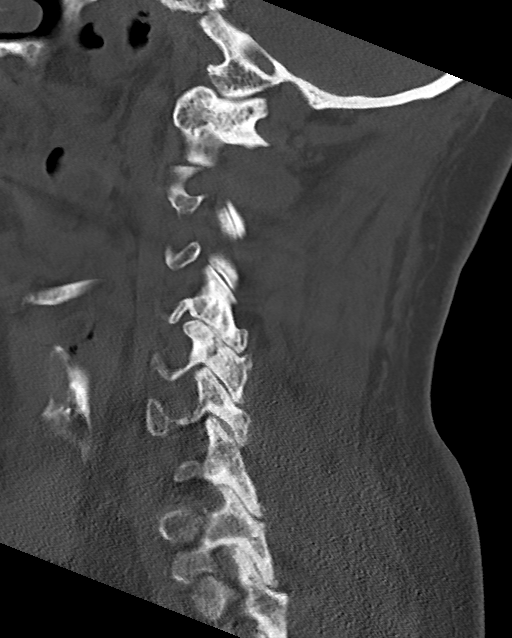
[im 26/63  bone]
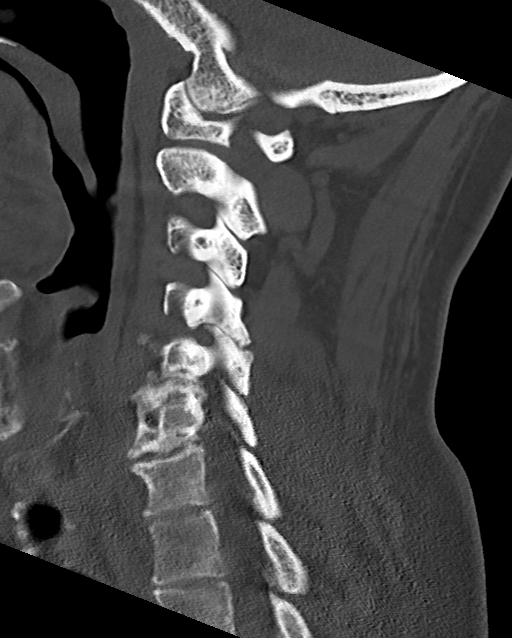
[im 32/63  soft-tissue]
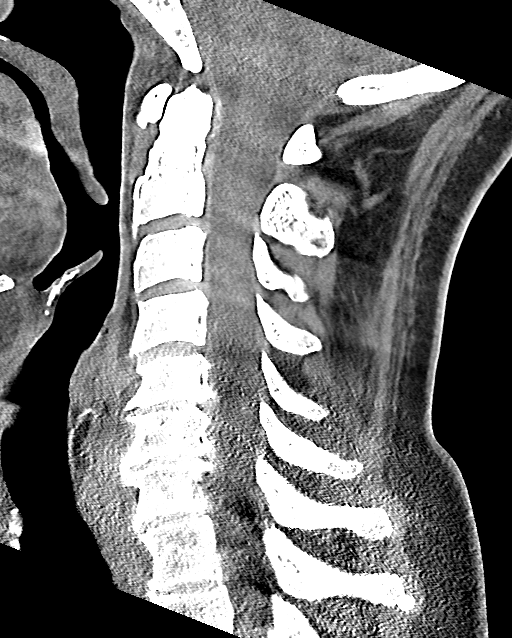
[im 32/63  bone]
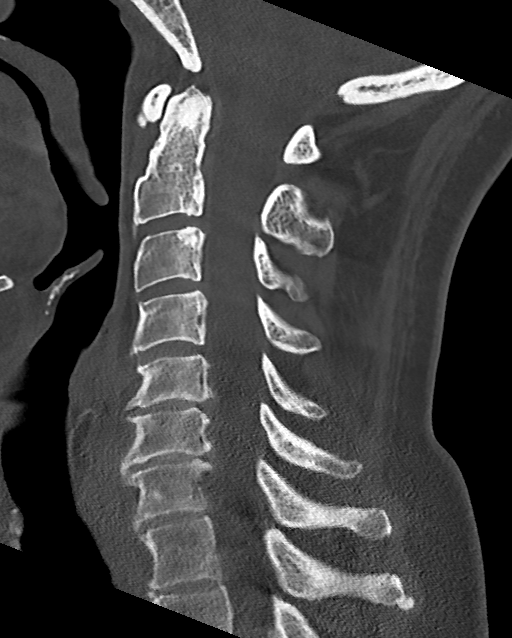
[im 37/63  bone]
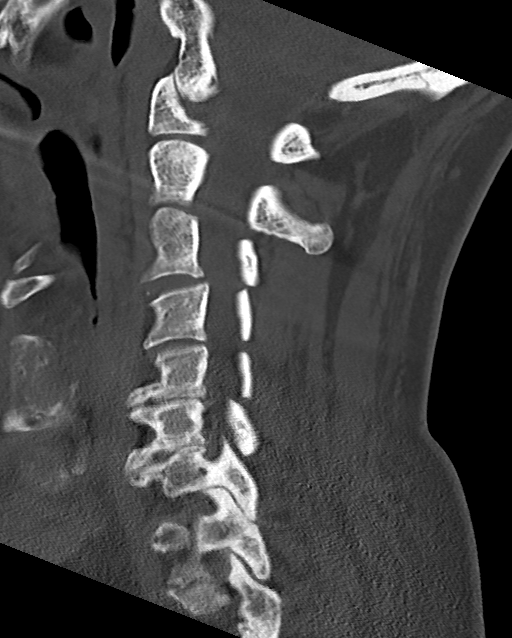
[im 42/63  bone]
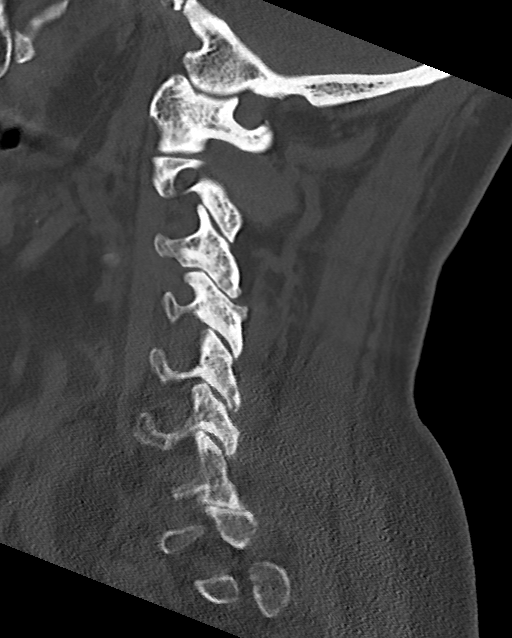

[Series 9: c_spine 2.0 cor bone · coronal · 0.24mm/px · 3 of 68 slices shown]
[im 14/68  bone]
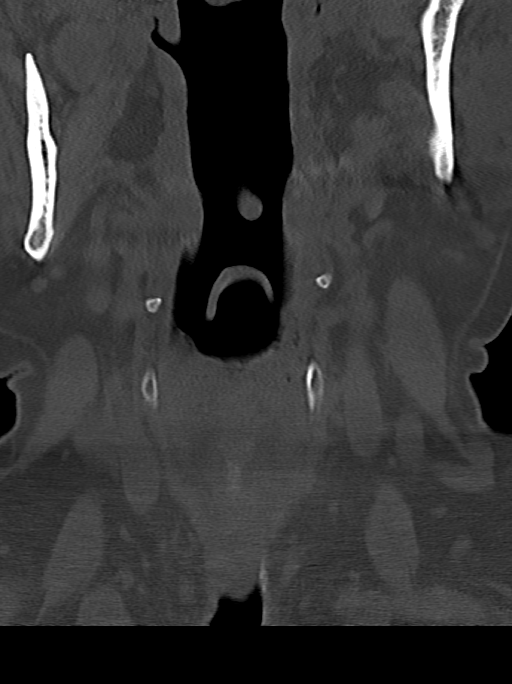
[im 27/68  bone]
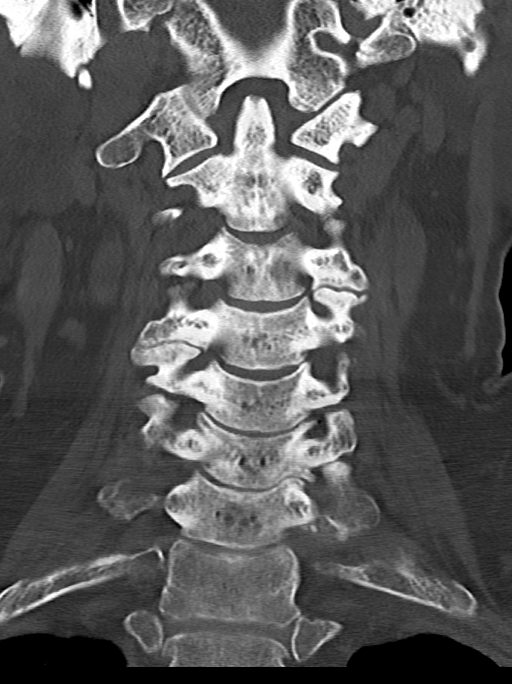
[im 41/68  bone]
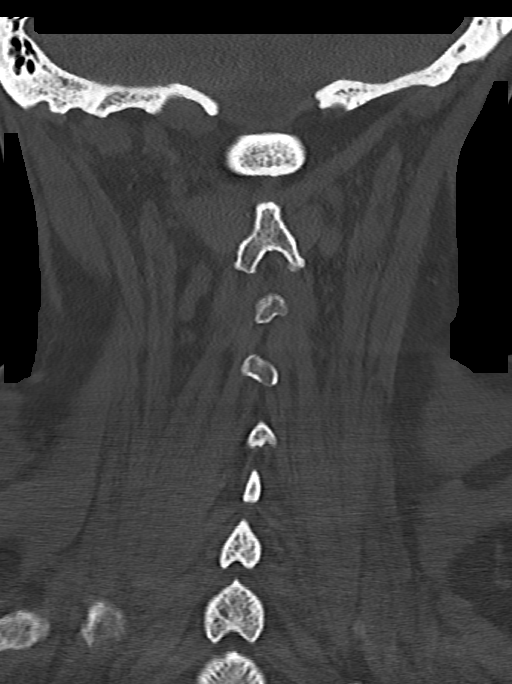

[13 of 33 positions shown; findings below may reference images not displayed]

FINDINGS: CT HEAD FINDINGS

Brain: No evidence of acute infarction, hemorrhage, hydrocephalus,
extra-axial collection or mass lesion/mass effect. Patchy white
matter hypodensities, nonspecific but compatible with chronic
microvascular ischemic disease. Partially empty sella.

Vascular: No evidence of hyperdense vessel.

Skull: No acute fracture.

Sinuses/Orbits: Small retention cysts in the maxillary sinuses.
Otherwise clear sinuses.

Other: No mastoid effusions.

CT CERVICAL SPINE FINDINGS

Alignment: Straightening of the normal cervical lordosis. No
substantial sagittal subluxation.

Skull base and vertebrae: Vertebral body heights are maintained. No
evidence of acute fracture.

Soft tissues and spinal canal: Visualized lung apices are clear.

Disc levels: Moderate multilevel degenerative change including disc
height loss and endplate spurring. Multilevel facet and
uncovertebral hypertrophy with varying degrees of neural foraminal
stenosis.

Upper chest: Visualized lung apices are clear
IMPRESSION: 1. No evidence of acute intracranial abnormality.
2. No evidence of acute fracture or traumatic malalignment.

## 2023-03-18 ENCOUNTER — Other Ambulatory Visit: Payer: Self-pay

## 2023-03-18 ENCOUNTER — Emergency Department (HOSPITAL_COMMUNITY): Payer: No Typology Code available for payment source

## 2023-03-18 ENCOUNTER — Emergency Department (HOSPITAL_COMMUNITY)
Admission: EM | Admit: 2023-03-18 | Discharge: 2023-03-18 | Disposition: A | Discharge status: Home / Self Care | Payer: No Typology Code available for payment source | Attending: Emergency Medicine | Admitting: Emergency Medicine

## 2023-03-18 ENCOUNTER — Encounter (HOSPITAL_COMMUNITY): Payer: Self-pay

## 2023-03-18 DIAGNOSIS — Z7982 Long term (current) use of aspirin: Secondary | ICD-10-CM | POA: Insufficient documentation

## 2023-03-18 DIAGNOSIS — E1122 Type 2 diabetes mellitus with diabetic chronic kidney disease: Secondary | ICD-10-CM | POA: Insufficient documentation

## 2023-03-18 DIAGNOSIS — I129 Hypertensive chronic kidney disease with stage 1 through stage 4 chronic kidney disease, or unspecified chronic kidney disease: Secondary | ICD-10-CM | POA: Diagnosis not present

## 2023-03-18 DIAGNOSIS — Z79899 Other long term (current) drug therapy: Secondary | ICD-10-CM | POA: Insufficient documentation

## 2023-03-18 DIAGNOSIS — Z789 Other specified health status: Secondary | ICD-10-CM

## 2023-03-18 DIAGNOSIS — Z7984 Long term (current) use of oral hypoglycemic drugs: Secondary | ICD-10-CM | POA: Insufficient documentation

## 2023-03-18 DIAGNOSIS — I951 Orthostatic hypotension: Secondary | ICD-10-CM | POA: Insufficient documentation

## 2023-03-18 DIAGNOSIS — N189 Chronic kidney disease, unspecified: Secondary | ICD-10-CM | POA: Diagnosis not present

## 2023-03-18 DIAGNOSIS — R55 Syncope and collapse: Secondary | ICD-10-CM | POA: Diagnosis present

## 2023-03-18 DIAGNOSIS — F1090 Alcohol use, unspecified, uncomplicated: Secondary | ICD-10-CM | POA: Diagnosis not present

## 2023-03-18 LAB — CBC
HCT: 32.7 % — ABNORMAL LOW (ref 39.0–52.0)
Hemoglobin: 10.7 g/dL — ABNORMAL LOW (ref 13.0–17.0)
MCH: 32.6 pg (ref 26.0–34.0)
MCHC: 32.7 g/dL (ref 30.0–36.0)
MCV: 99.7 fL (ref 80.0–100.0)
Platelets: 264 10*3/uL (ref 150–400)
RBC: 3.28 MIL/uL — ABNORMAL LOW (ref 4.22–5.81)
RDW: 14.6 % (ref 11.5–15.5)
WBC: 5.9 10*3/uL (ref 4.0–10.5)
nRBC: 0 % (ref 0.0–0.2)

## 2023-03-18 LAB — COMPREHENSIVE METABOLIC PANEL
ALT: 43 U/L (ref 0–44)
AST: 31 U/L (ref 15–41)
Albumin: 3.3 g/dL — ABNORMAL LOW (ref 3.5–5.0)
Alkaline Phosphatase: 54 U/L (ref 38–126)
Anion gap: 10 (ref 5–15)
BUN: 26 mg/dL — ABNORMAL HIGH (ref 8–23)
CO2: 20 mmol/L — ABNORMAL LOW (ref 22–32)
Calcium: 8.8 mg/dL — ABNORMAL LOW (ref 8.9–10.3)
Chloride: 108 mmol/L (ref 98–111)
Creatinine, Ser: 1.78 mg/dL — ABNORMAL HIGH (ref 0.61–1.24)
GFR, Estimated: 41 mL/min — ABNORMAL LOW (ref >60)
Glucose, Bld: 110 mg/dL — ABNORMAL HIGH (ref 70–99)
Potassium: 3.8 mmol/L (ref 3.5–5.1)
Sodium: 138 mmol/L (ref 135–145)
Total Bilirubin: 0.7 mg/dL (ref 0.3–1.2)
Total Protein: 6.9 g/dL (ref 6.5–8.1)

## 2023-03-18 LAB — MAGNESIUM: Magnesium: 2.1 mg/dL (ref 1.7–2.4)

## 2023-03-18 LAB — ETHANOL: Alcohol, Ethyl (B): 95 mg/dL — ABNORMAL HIGH (ref <10)

## 2023-03-18 MED ORDER — LACTATED RINGERS IV BOLUS
1000.0000 mL | Freq: Once | INTRAVENOUS | Status: AC
  Administered 2023-03-18: 1000 mL via INTRAVENOUS

## 2023-03-18 NOTE — ED Provider Notes (Signed)
[facility] AT [facility] Provider Note   CSN: [id number] Arrival date & time: 03/18/23  1546     History  Chief Complaint  Patient presents with   Near Syncope    John Fisher is a 67 y.o. male.  67 year old male with history of alcohol use, cocaine abuse, cannabis use, CKD, HTN, and DM who presents emergency department with a syncopal event.  Patient reports that [facility] was preparing dinner for tonight and after taking a shot of alcohol bent over to pick something up from the floor felt dizzy and passed out.  No chest pain or shortness of breath at all.  Says [facility] has been trying to stay well-hydrated and was not outside for prolonged period of time today.  Does have a history of orthostatic syncope.  Does not believe John Fisher seen a cardiologist about this.  Not on any blood thinners does not believe John Fisher had any significant head trauma.  Says that John Fisher frequently will get dizzy or pass out when John [facility] tries to stand up.  No personal history of arrhythmias or other cardiac history that John [facility] knows of.  No family history of arrhythmia or sudden unexplained death.  Per daughter, passed out today while cooking dinner. Didn't hit head. She did witness the fall. Was in rehab for a while because of his falls. Have not seen a cardiologist yet. Has noted that when [facility] bends over at rehab his blood pressure would drop drastically.        Home Medications Prior to Admission medications   Medication Sig Start Date End Date Taking? Authorizing Provider  amLODipine (NORVASC) 5 MG tablet Take 5 mg by mouth daily. 10/22/22   [provider]  aspirin EC 81 MG tablet Take 81 mg by mouth daily.    [provider]  atorvastatin (LIPITOR) 20 MG tablet Take 20 mg by mouth 2 (two) times a week.    [provider]  B Complex Vitamins (VITAMIN B COMPLEX) TABS Take 1 tablet by mouth daily.    [provider]  cholecalciferol (VITAMIN D3) 25 MCG (1000 UNIT) tablet  Take 1,000 Units by mouth daily.    [provider]  cyanocobalamin (VITAMIN B12) 1000 MCG tablet Take 1,000 mcg by mouth daily.    [provider]  diclofenac Sodium (VOLTAREN) 1 % GEL Apply 2 g topically 4 (four) times daily as needed (for pain- affected areas).    [provider]  DULoxetine (CYMBALTA) 60 MG capsule Take 60 mg by mouth daily.    [provider]  feeding supplement (ENSURE ENLIVE / ENSURE PLUS) LIQD Take 237 mLs by mouth 2 (two) times daily between meals. Patient not taking: Reported on 12/08/2022 09/02/22   Westley Chandler, MD  finasteride (PROSCAR) 5 MG tablet Take 5 mg by mouth daily.    [provider]  fluticasone (FLONASE) 50 MCG/ACT nasal spray Place 1 spray into both nostrils 2 (two) times daily as needed for allergies or rhinitis. Patient taking differently: Place 2 sprays into both nostrils in the morning and at bedtime. 09/11/20   Ellamae Sia, DO  folic acid (FOLVITE) 1 MG tablet Take 1 tablet (1 mg total) by mouth daily. Patient not taking: Reported on 12/08/2022 08/31/22   Maury Dus, MD  gabapentin (NEURONTIN) 100 MG capsule Take 1 capsule (100 mg total) by mouth 2 (two) times daily. Patient not taking: Reported on 12/08/2022 12/06/22   Cathren Harsh, MD  metFORMIN (GLUCOPHAGE)  500 MG tablet Take 500 mg by mouth daily with breakfast.    [provider]  naloxone (NARCAN) nasal spray 4 mg/0.1 mL Place 1 spray into the nose once as needed (for accidental overdose). 01/12/22   [provider]  traMADol (ULTRAM) 50 MG tablet Take 1 tablet (50 mg total) by mouth every 6 (six) hours as needed for moderate pain or severe pain. Patient not taking: Reported on 12/08/2022 12/06/22   Rai, Delene Ruffini, MD  valsartan (DIOVAN) 160 MG tablet Take 80 mg by mouth in the morning.    [provider]      Allergies    Trospium and Alfuzosin    Review of Systems   Review of Systems  Physical Exam Updated  Vital Signs BP 114/79 (BP Location: Right Arm)   Pulse 77   Temp (!) 97.5 F (36.4 C) (Oral)   Resp (!) 22   Ht 6' (1.829 m)   Wt 82.1 kg   SpO2 100%   BMI 24.55 kg/m  Physical Exam Vitals and nursing note reviewed.  Constitutional:      General: John [facility] is not in acute distress.    Appearance: [facility] is well-developed.  HENT:     Head: Normocephalic and atraumatic.     Right Ear: External ear normal.     Left Ear: External ear normal.     Nose: Nose normal.  Eyes:     Extraocular Movements: Extraocular movements intact.     Conjunctiva/sclera: Conjunctivae normal.     Pupils: Pupils are equal, round, and reactive to light.  Cardiovascular:     Rate and Rhythm: Normal rate and regular rhythm.     Heart sounds: Normal heart sounds.  Pulmonary:     Effort: Pulmonary effort is normal. No respiratory distress.     Breath sounds: Normal breath sounds.  Musculoskeletal:     Cervical back: Normal range of motion and neck supple.     Right lower leg: No edema.     Left lower leg: No edema.  Skin:    General: Skin is warm and dry.  Neurological:     Mental Status: [facility] is alert. Mental status is at baseline.  Psychiatric:        Mood and Affect: Mood normal.        Behavior: Behavior normal.     ED Results / Procedures / Treatments   Labs (all labs ordered are listed, but only abnormal results are displayed) Labs Reviewed  CBC - Abnormal; Notable for the following components:      Result Value   RBC 3.28 (*)    Hemoglobin 10.7 (*)    HCT 32.7 (*)    All other components within normal limits  COMPREHENSIVE METABOLIC PANEL - Abnormal; Notable for the following components:   CO2 20 (*)    Glucose, Bld 110 (*)    BUN 26 (*)    Creatinine, Ser 1.78 (*)    Calcium 8.8 (*)    Albumin 3.3 (*)    GFR, Estimated 41 (*)    All other components within normal limits  ETHANOL - Abnormal; Notable for the following components:   Alcohol, Ethyl (B) 95 (*)    All other components within  normal limits  MAGNESIUM    EKG EKG Interpretation Date/Time:  Thursday March 18 2023 16:24:10 EDT Ventricular Rate:  70 PR Interval:  184 QRS Duration:  83 QT Interval:  412 QTC Calculation: 445 R Axis:   -22  Text Interpretation: Sinus rhythm Abnormal R-wave progression, early transition Left ventricular hypertrophy Confirmed by Vonita Moss [phone]) on 03/18/2023 4:25:51 PM  Radiology No results found.  Procedures Procedures    Medications Ordered in ED Medications  lactated ringers bolus 1,000 mL (1,000 mLs Intravenous New Bag/Given 03/18/23 1629)    ED Course/ Medical Decision Making/ A&P                             Medical Decision Making Amount and/or Complexity of Data Reviewed Labs: ordered. Radiology: ordered.   CRISTAIN GALENO is a 67 y.o. male with comorbidities that complicate the patient evaluation including alcohol abuse, cocaine abuse, THC use, HTN, DM, CKD, and orthostatic hypotension/syncope presents emergency department  Initial Ddx:  Orthostatic syncope, arrhythmia, MI, dehydration, traumatic injury  MDM/Course:  Feel the patient had an orthostatic syncopal episode as [facility] has a longstanding history of this.  This was brought on by bending over and postural change.  Did have some preceding dizziness as well.  Did not lose consciousness without warning which would be typical of arrhythmia genic syncope.  No chest pain or other anginal equivalent suggest an MI.  Fall was witnessed by his daughter who states that [facility] did not hit his head did not feel that head imaging is warranted at this time.  Was drinking earlier today so we will check an ethanol level as this could be related to his fall.  Will give him some IV fluids and check orthostatics on him as well.  Signed out to the oncoming provider awaiting final lab work results and repeat evaluation.  This patient presents to the ED for concern of complaints listed in HPI, this involves an extensive number of  treatment options, and is a complaint that carries with it a high risk of complications and morbidity. Disposition including potential need for admission considered.   Dispo: Pending remainder of workup  Additional history obtained from daughter Records reviewed Outpatient Clinic Notes I personally reviewed and interpreted cardiac monitoring: normal sinus rhythm  I personally reviewed and interpreted the pt's EKG: see above for interpretation  I have reviewed the patients home medications and made adjustments as needed Social Determinants of health:  Alcohol abuse         Final Clinical Impression(s) / ED Diagnoses Final diagnoses:  Syncope and collapse  Orthostatic syncope  Alcohol use    Rx / DC Orders ED Discharge Orders     None         Rondel Baton, MD 03/18/23 1704

## 2023-03-18 NOTE — ED Notes (Signed)
Pt provided discharge instructions and prescription information. Pt was given the opportunity to ask questions and questions were answered.   

## 2023-03-18 NOTE — ED Triage Notes (Addendum)
Pt BIB EMS. Pt was fixing dinner when he had a sudden onset of room spinning dizziness and almost passed out. Pt fell. Pt denies injury at this time. Pt denies blood thinner. EMS report initial B/P was 70 systolic. Pt reports resolution of dizziness at this time. Pt A/Ox4. Pt reports drinking ETOH today.

## 2023-03-18 NOTE — ED Provider Notes (Signed)
67 yo male w/ known history of orthostatic syncope, polysubstance use Here with syncope episode, dizziness when bending over, no head injury (witnessed by daughter) Multiple similar episodes in the past - follows at [place] primarily  Physical Exam  BP 114/79 (BP Location: Right Arm)   Pulse 77   Temp (!) 97.5 F (36.4 C) (Oral)   Resp (!) 22   Ht 6' (1.829 m)   Wt 82.1 kg   SpO2 100%   BMI 24.55 kg/m   Physical Exam  Procedures  Procedures  ED Course / MDM    Medical Decision Making Amount and/or Complexity of Data Reviewed Labs: ordered. Radiology: ordered.   I personally reviewed the patient's labs, some mild increase of the BUN but otherwise no emergent findings.  Patient with finished a liter of fluids.  Vital signs and telemetry monitor have been unremarkable and stable during his stay in the ED.  He feels asymptomatic and back to baseline.  He reports multiple prior episodes very similar to this specifically when he bends over he describes vertigo-like sensation and often will have brief syncope.  He has had an echocardiogram within the past year which showed no structural abnormalities.  Given the likelihood that this is consistent with orthostasis or vasovagal episode, I do think it is reasonable to discharge him at this time, and that there is no emergent indication for rehospitalization.  Echo 2023 EF 60-65% no significant valvular disease       Terald Sleeper, MD 03/18/23 1751

## 2023-03-18 NOTE — Discharge Instructions (Addendum)
You were seen for passing out in the emergency department.   At home, please stay well-hydrated.  Stop taking your amlodipine since this may be related to your passing out.    Check your MyChart online for the results of any tests that had not resulted by the time you left the emergency department.   Follow-up with your primary doctor in 2-3 days regarding your visit.  Please talk to them about getting a cardiology referral.  Return immediately to the emergency department if you experience any of the following: Recurrent fainting, chest pain, shortness of breath, palpitations, or any other concerning symptoms.    Thank you for visiting our Emergency Department. It was a pleasure taking care of you today.

## 2023-04-02 ENCOUNTER — Encounter (HOSPITAL_COMMUNITY): Payer: Self-pay | Admitting: Emergency Medicine

## 2023-04-02 ENCOUNTER — Emergency Department (HOSPITAL_COMMUNITY): Payer: No Typology Code available for payment source

## 2023-04-02 ENCOUNTER — Inpatient Hospital Stay (HOSPITAL_COMMUNITY)
Admission: EM | Admit: 2023-04-02 | Discharge: 2023-04-05 | DRG: 312 | Disposition: A | Discharge status: Home / Self Care | Payer: No Typology Code available for payment source | Attending: Family Medicine | Admitting: Family Medicine

## 2023-04-02 DIAGNOSIS — I951 Orthostatic hypotension: Principal | ICD-10-CM | POA: Diagnosis present

## 2023-04-02 DIAGNOSIS — F10129 Alcohol abuse with intoxication, unspecified: Secondary | ICD-10-CM | POA: Diagnosis present

## 2023-04-02 DIAGNOSIS — Y904 Blood alcohol level of 80-99 mg/100 ml: Secondary | ICD-10-CM | POA: Diagnosis present

## 2023-04-02 DIAGNOSIS — R55 Syncope and collapse: Secondary | ICD-10-CM | POA: Diagnosis present

## 2023-04-02 DIAGNOSIS — Z96653 Presence of artificial knee joint, bilateral: Secondary | ICD-10-CM | POA: Diagnosis present

## 2023-04-02 DIAGNOSIS — Z8249 Family history of ischemic heart disease and other diseases of the circulatory system: Secondary | ICD-10-CM

## 2023-04-02 DIAGNOSIS — R296 Repeated falls: Secondary | ICD-10-CM | POA: Diagnosis present

## 2023-04-02 DIAGNOSIS — E1122 Type 2 diabetes mellitus with diabetic chronic kidney disease: Secondary | ICD-10-CM | POA: Diagnosis present

## 2023-04-02 DIAGNOSIS — E860 Dehydration: Secondary | ICD-10-CM | POA: Diagnosis present

## 2023-04-02 DIAGNOSIS — Z23 Encounter for immunization: Secondary | ICD-10-CM

## 2023-04-02 DIAGNOSIS — Z79899 Other long term (current) drug therapy: Secondary | ICD-10-CM

## 2023-04-02 DIAGNOSIS — M1990 Unspecified osteoarthritis, unspecified site: Secondary | ICD-10-CM | POA: Diagnosis not present

## 2023-04-02 DIAGNOSIS — I129 Hypertensive chronic kidney disease with stage 1 through stage 4 chronic kidney disease, or unspecified chronic kidney disease: Secondary | ICD-10-CM | POA: Diagnosis not present

## 2023-04-02 DIAGNOSIS — N1832 Chronic kidney disease, stage 3b: Secondary | ICD-10-CM | POA: Insufficient documentation

## 2023-04-02 DIAGNOSIS — E119 Type 2 diabetes mellitus without complications: Secondary | ICD-10-CM

## 2023-04-02 DIAGNOSIS — Z7982 Long term (current) use of aspirin: Secondary | ICD-10-CM

## 2023-04-02 DIAGNOSIS — N179 Acute kidney failure, unspecified: Secondary | ICD-10-CM | POA: Diagnosis present

## 2023-04-02 DIAGNOSIS — F1090 Alcohol use, unspecified, uncomplicated: Secondary | ICD-10-CM | POA: Diagnosis present

## 2023-04-02 DIAGNOSIS — Z7984 Long term (current) use of oral hypoglycemic drugs: Secondary | ICD-10-CM

## 2023-04-02 DIAGNOSIS — Z888 Allergy status to other drugs, medicaments and biological substances status: Secondary | ICD-10-CM

## 2023-04-02 DIAGNOSIS — Z833 Family history of diabetes mellitus: Secondary | ICD-10-CM | POA: Diagnosis not present

## 2023-04-02 DIAGNOSIS — E872 Acidosis, unspecified: Secondary | ICD-10-CM | POA: Diagnosis not present

## 2023-04-02 LAB — ETHANOL: Alcohol, Ethyl (B): 84 mg/dL — ABNORMAL HIGH (ref <10)

## 2023-04-02 LAB — CBC WITH DIFFERENTIAL/PLATELET
Abs Immature Granulocytes: 0.02 10*3/uL (ref 0.00–0.07)
Basophils Absolute: 0 10*3/uL (ref 0.0–0.1)
Basophils Relative: 0 %
Eosinophils Absolute: 0 10*3/uL (ref 0.0–0.5)
Eosinophils Relative: 0 %
HCT: 37.1 % — ABNORMAL LOW (ref 39.0–52.0)
Hemoglobin: 11.9 g/dL — ABNORMAL LOW (ref 13.0–17.0)
Immature Granulocytes: 0 %
Lymphocytes Relative: 37 %
Lymphs Abs: 3.1 10*3/uL (ref 0.7–4.0)
MCH: 32.4 pg (ref 26.0–34.0)
MCHC: 32.1 g/dL (ref 30.0–36.0)
MCV: 101.1 fL — ABNORMAL HIGH (ref 80.0–100.0)
Monocytes Absolute: 0.6 10*3/uL (ref 0.1–1.0)
Monocytes Relative: 7 %
Neutro Abs: 4.7 10*3/uL (ref 1.7–7.7)
Neutrophils Relative %: 56 %
Platelets: 154 10*3/uL (ref 150–400)
RBC: 3.67 MIL/uL — ABNORMAL LOW (ref 4.22–5.81)
RDW: 14.6 % (ref 11.5–15.5)
WBC: 8.4 10*3/uL (ref 4.0–10.5)
nRBC: 0 % (ref 0.0–0.2)

## 2023-04-02 LAB — COMPREHENSIVE METABOLIC PANEL
ALT: 41 U/L (ref 0–44)
AST: 62 U/L — ABNORMAL HIGH (ref 15–41)
Albumin: 3 g/dL — ABNORMAL LOW (ref 3.5–5.0)
Alkaline Phosphatase: 68 U/L (ref 38–126)
Anion gap: 13 (ref 5–15)
BUN: 31 mg/dL — ABNORMAL HIGH (ref 8–23)
CO2: 15 mmol/L — ABNORMAL LOW (ref 22–32)
Calcium: 8 mg/dL — ABNORMAL LOW (ref 8.9–10.3)
Chloride: 107 mmol/L (ref 98–111)
Creatinine, Ser: 2.04 mg/dL — ABNORMAL HIGH (ref 0.61–1.24)
GFR, Estimated: 35 mL/min — ABNORMAL LOW (ref >60)
Glucose, Bld: 154 mg/dL — ABNORMAL HIGH (ref 70–99)
Potassium: 4.4 mmol/L (ref 3.5–5.1)
Sodium: 135 mmol/L (ref 135–145)
Total Bilirubin: 1.2 mg/dL (ref 0.3–1.2)
Total Protein: 6.9 g/dL (ref 6.5–8.1)

## 2023-04-02 LAB — TROPONIN I (HIGH SENSITIVITY): Troponin I (High Sensitivity): 7 ng/L (ref <18)

## 2023-04-02 MED ORDER — TETANUS-DIPHTH-ACELL PERTUSSIS 5-2.5-18.5 LF-MCG/0.5 IM SUSY
0.5000 mL | PREFILLED_SYRINGE | Freq: Once | INTRAMUSCULAR | Status: AC
  Administered 2023-04-02: 0.5 mL via INTRAMUSCULAR
  Filled 2023-04-02: qty 0.5

## 2023-04-02 MED ORDER — LACTATED RINGERS IV BOLUS
1000.0000 mL | Freq: Once | INTRAVENOUS | Status: AC
  Administered 2023-04-02: 1000 mL via INTRAVENOUS

## 2023-04-02 NOTE — ED Provider Notes (Incomplete)
[place] EMERGENCY DEPARTMENT AT [facility] Provider Note   CSN: [id number] Arrival date & time: 04/02/23  2124     History {Add pertinent medical, surgical, social history, OB history to HPI:1} Chief Complaint  Patient presents with   Loss of Consciousness    [facility] is a 67 y.o. male.   Loss of Consciousness 67 year old male history of diabetes, hypertension presenting for fall.  Patient states he was at home walking to his car.  He states he had about 1 shot to drink tonight which is less than normal.  He states he felt dizzy every time he stood up.  He fell backwards and hit his head and passed out before he hit the ground.  He almost fell a couple other times as well.  His neighbors had to help him get up.  With EMS he had headache and neck pain, and was mildly hypotensive.  Blood sugar normal.  Currently has some pain to the back of his head and right knee.  Denies chest pain, difficulty breathing, weakness, numbness.  No back pain.  Denies any recent fevers, chills, vomiting.     Home Medications Prior to Admission medications   Medication Sig Start Date End Date Taking? Authorizing Provider  aspirin EC 81 MG tablet Take 81 mg by mouth daily.    [provider]  atorvastatin (LIPITOR) 20 MG tablet Take 20 mg by mouth 2 (two) times a week.    [provider]  B Complex Vitamins (VITAMIN B COMPLEX) TABS Take 1 tablet by mouth daily.    [provider]  cholecalciferol (VITAMIN D3) 25 MCG (1000 UNIT) tablet Take 1,000 Units by mouth daily.    [provider]  cyanocobalamin (VITAMIN B12) 1000 MCG tablet Take 1,000 mcg by mouth daily.    [provider]  diclofenac Sodium (VOLTAREN) 1 % GEL Apply 2 g topically 4 (four) times daily as needed (for pain- affected areas).    [provider]  DULoxetine (CYMBALTA) 60 MG capsule Take 60 mg by mouth daily.    [provider]  feeding supplement (ENSURE  ENLIVE / ENSURE PLUS) LIQD Take 237 mLs by mouth 2 (two) times daily between meals. Patient not taking: Reported on 12/08/2022 09/02/22   Westley Chandler, MD  finasteride (PROSCAR) 5 MG tablet Take 5 mg by mouth daily.    [provider]  fluticasone (FLONASE) 50 MCG/ACT nasal spray Place 1 spray into both nostrils 2 (two) times daily as needed for allergies or rhinitis. Patient taking differently: Place 2 sprays into both nostrils in the morning and at bedtime. 09/11/20   Ellamae Sia, DO  folic acid (FOLVITE) 1 MG tablet Take 1 tablet (1 mg total) by mouth daily. Patient not taking: Reported on 12/08/2022 08/31/22   Maury Dus, MD  gabapentin (NEURONTIN) 100 MG capsule Take 1 capsule (100 mg total) by mouth 2 (two) times daily. Patient not taking: Reported on 12/08/2022 12/06/22   Rai, Delene Ruffini, MD  metFORMIN (GLUCOPHAGE) 500 MG tablet Take 500 mg by mouth daily with breakfast.    [provider]  naloxone (NARCAN) nasal spray 4 mg/0.1 mL Place 1 spray into the nose once as needed (for accidental overdose). 01/12/22   [provider]  traMADol (ULTRAM) 50 MG tablet Take 1 tablet (50 mg total) by mouth every 6 (six) hours as needed for moderate pain or severe pain. Patient not taking: Reported on 12/08/2022 12/06/22   Rai, Ripudeep  K, MD  valsartan (DIOVAN) 160 MG tablet Take 80 mg by mouth in the morning.    [provider]      Allergies    Trospium and Alfuzosin    Review of Systems   Review of Systems  Cardiovascular:  Positive for syncope.    Physical Exam Updated Vital Signs BP 108/74 (BP Location: Left Arm)   Pulse (!) 58   Temp 97.9 F (36.6 C) (Oral)   Resp (!) 21   SpO2 100%  Physical Exam Vitals and nursing note reviewed.  Constitutional:      General: He is not in acute distress.    Appearance: He is well-developed.  HENT:     Head: Normocephalic.     Comments: Hematoma to the left occipital scalp.  No laceration.    Nose: Nose  normal. No congestion.     Mouth/Throat:     Mouth: Mucous membranes are moist.     Pharynx: Oropharynx is clear.  Eyes:     Extraocular Movements: Extraocular movements intact.     Conjunctiva/sclera: Conjunctivae normal.     Pupils: Pupils are equal, round, and reactive to light.  Neck:     Comments: In cervical collar Cardiovascular:     Rate and Rhythm: Normal rate and regular rhythm.     Heart sounds: No murmur heard. Pulmonary:     Effort: Pulmonary effort is normal. No respiratory distress.     Breath sounds: Normal breath sounds.  Abdominal:     Palpations: Abdomen is soft.     Tenderness: There is no abdominal tenderness. There is no guarding or rebound.  Musculoskeletal:        General: No swelling.     Cervical back: Neck supple. No tenderness.     Comments: Mild pain with range of motion of the right knee.  No external trauma.  Skin:    General: Skin is warm and dry.     Capillary Refill: Capillary refill takes less than 2 seconds.  Neurological:     General: No focal deficit present.     Mental Status: He is alert and oriented to person, place, and time. Mental status is at baseline.     Cranial Nerves: No cranial nerve deficit.     Sensory: No sensory deficit.     Motor: No weakness.     Coordination: Coordination normal.  Psychiatric:        Mood and Affect: Mood normal.     ED Results / Procedures / Treatments   Labs (all labs ordered are listed, but only abnormal results are displayed) Labs Reviewed - No data to display  EKG None  Radiology No results found.  Procedures Procedures  {Document cardiac monitor, telemetry assessment procedure when appropriate:1}  Medications Ordered in ED Medications - No data to display  ED Course/ Medical Decision Making/ A&P   {   Click here for ABCD2, HEART and other calculatorsREFRESH Note before signing :1}                          Medical Decision Making  Medical Decision Making:   [facility] is a  67 y.o. male who presented to the ED today with episode of syncope.  No signs reviewed noted for mild bradycardia.  EKG without signs of acute ischemia or arrhythmia.  Patient reports persistent dizziness and episode of syncope.  He was drinking some alcohol though does not appear clinically intoxicated.  On exam he has pain to his right knee, neck, head.  Will obtain CT head and C-spine as well as workup for syncope.   {crccomplexity:27900} Reviewed and confirmed nursing documentation for past medical history, family history, social history.  Initial Study Results:   Laboratory  All laboratory results reviewed.  Labs notable for ***  ***EKG EKG was reviewed independently. Rate, rhythm, axis, intervals all examined and without medically relevant abnormality. ST segments without concerns for elevations.    Radiology:  All images reviewed independently. ***Agree with radiology report at this time.      Consults: Case discussed with ***.   Reassessment and Plan:   ***    Patient's presentation is most consistent with {EM COPA:27473}     {Document critical care time when appropriate:1} {Document review of labs and clinical decision tools ie heart score, Chads2Vasc2 etc:1}  {Document your independent review of radiology images, and any outside records:1} {Document your discussion with family members, caretakers, and with consultants:1} {Document social determinants of health affecting pt's care:1} {Document your decision making why or why not admission, treatments were needed:1} Final Clinical Impression(s) / ED Diagnoses Final diagnoses:  None    Rx / DC Orders ED Discharge Orders     None

## 2023-04-02 NOTE — ED Triage Notes (Addendum)
PT was walking to car after drinking ETOH. Pt has hx of such and hematoma on back of head. Not on thinners. Fell backwards and hit head. Bystander endorsed LOC. C/o head and neck pain. Pt hypotensive on EMS arrival. They gave 400 cc fluid. CBG 189.

## 2023-04-02 NOTE — ED Notes (Signed)
Pt aware of need for urine sample, urinal at bedside 

## 2023-04-03 ENCOUNTER — Observation Stay (HOSPITAL_COMMUNITY): Payer: No Typology Code available for payment source

## 2023-04-03 ENCOUNTER — Inpatient Hospital Stay (HOSPITAL_COMMUNITY): Payer: No Typology Code available for payment source

## 2023-04-03 ENCOUNTER — Other Ambulatory Visit: Payer: Self-pay

## 2023-04-03 DIAGNOSIS — Z23 Encounter for immunization: Secondary | ICD-10-CM | POA: Diagnosis not present

## 2023-04-03 DIAGNOSIS — M1990 Unspecified osteoarthritis, unspecified site: Secondary | ICD-10-CM | POA: Diagnosis present

## 2023-04-03 DIAGNOSIS — E872 Acidosis, unspecified: Secondary | ICD-10-CM | POA: Diagnosis present

## 2023-04-03 DIAGNOSIS — Z7984 Long term (current) use of oral hypoglycemic drugs: Secondary | ICD-10-CM | POA: Diagnosis not present

## 2023-04-03 DIAGNOSIS — R296 Repeated falls: Secondary | ICD-10-CM | POA: Diagnosis present

## 2023-04-03 DIAGNOSIS — N179 Acute kidney failure, unspecified: Secondary | ICD-10-CM | POA: Diagnosis present

## 2023-04-03 DIAGNOSIS — Z96653 Presence of artificial knee joint, bilateral: Secondary | ICD-10-CM | POA: Diagnosis present

## 2023-04-03 DIAGNOSIS — E860 Dehydration: Secondary | ICD-10-CM | POA: Diagnosis present

## 2023-04-03 DIAGNOSIS — F10129 Alcohol abuse with intoxication, unspecified: Secondary | ICD-10-CM | POA: Diagnosis present

## 2023-04-03 DIAGNOSIS — I951 Orthostatic hypotension: Secondary | ICD-10-CM | POA: Diagnosis present

## 2023-04-03 DIAGNOSIS — Z79899 Other long term (current) drug therapy: Secondary | ICD-10-CM | POA: Diagnosis not present

## 2023-04-03 DIAGNOSIS — Z7982 Long term (current) use of aspirin: Secondary | ICD-10-CM | POA: Diagnosis not present

## 2023-04-03 DIAGNOSIS — R55 Syncope and collapse: Secondary | ICD-10-CM | POA: Diagnosis present

## 2023-04-03 DIAGNOSIS — N1832 Chronic kidney disease, stage 3b: Secondary | ICD-10-CM | POA: Diagnosis present

## 2023-04-03 DIAGNOSIS — E1122 Type 2 diabetes mellitus with diabetic chronic kidney disease: Secondary | ICD-10-CM | POA: Diagnosis present

## 2023-04-03 DIAGNOSIS — Y904 Blood alcohol level of 80-99 mg/100 ml: Secondary | ICD-10-CM | POA: Diagnosis present

## 2023-04-03 DIAGNOSIS — I129 Hypertensive chronic kidney disease with stage 1 through stage 4 chronic kidney disease, or unspecified chronic kidney disease: Secondary | ICD-10-CM | POA: Diagnosis present

## 2023-04-03 DIAGNOSIS — Z833 Family history of diabetes mellitus: Secondary | ICD-10-CM | POA: Diagnosis not present

## 2023-04-03 DIAGNOSIS — Z8249 Family history of ischemic heart disease and other diseases of the circulatory system: Secondary | ICD-10-CM | POA: Diagnosis not present

## 2023-04-03 DIAGNOSIS — Z888 Allergy status to other drugs, medicaments and biological substances status: Secondary | ICD-10-CM | POA: Diagnosis not present

## 2023-04-03 LAB — COMPREHENSIVE METABOLIC PANEL
ALT: 39 U/L (ref 0–44)
AST: 48 U/L — ABNORMAL HIGH (ref 15–41)
Albumin: 2.7 g/dL — ABNORMAL LOW (ref 3.5–5.0)
Alkaline Phosphatase: 58 U/L (ref 38–126)
Anion gap: 7 (ref 5–15)
BUN: 24 mg/dL — ABNORMAL HIGH (ref 8–23)
CO2: 22 mmol/L (ref 22–32)
Calcium: 8.1 mg/dL — ABNORMAL LOW (ref 8.9–10.3)
Chloride: 105 mmol/L (ref 98–111)
Creatinine, Ser: 1.71 mg/dL — ABNORMAL HIGH (ref 0.61–1.24)
GFR, Estimated: 43 mL/min — ABNORMAL LOW (ref >60)
Glucose, Bld: 133 mg/dL — ABNORMAL HIGH (ref 70–99)
Potassium: 3.7 mmol/L (ref 3.5–5.1)
Sodium: 134 mmol/L — ABNORMAL LOW (ref 135–145)
Total Bilirubin: 0.9 mg/dL (ref 0.3–1.2)
Total Protein: 6.3 g/dL — ABNORMAL LOW (ref 6.5–8.1)

## 2023-04-03 LAB — CBG MONITORING, ED
Glucose-Capillary: 120 mg/dL — ABNORMAL HIGH (ref 70–99)
Glucose-Capillary: 145 mg/dL — ABNORMAL HIGH (ref 70–99)

## 2023-04-03 LAB — URINALYSIS, ROUTINE W REFLEX MICROSCOPIC
Bilirubin Urine: NEGATIVE
Glucose, UA: 50 mg/dL — AB
Hgb urine dipstick: NEGATIVE
Ketones, ur: NEGATIVE mg/dL
Leukocytes,Ua: NEGATIVE
Nitrite: NEGATIVE
Protein, ur: NEGATIVE mg/dL
Specific Gravity, Urine: 1.011 (ref 1.005–1.030)
pH: 6 (ref 5.0–8.0)

## 2023-04-03 LAB — BLOOD GAS, VENOUS
Acid-base deficit: 9.8 mmol/L — ABNORMAL HIGH (ref 0.0–2.0)
Bicarbonate: 14.9 mmol/L — ABNORMAL LOW (ref 20.0–28.0)
O2 Saturation: 51.5 %
Patient temperature: 37
pCO2, Ven: 29 mmHg — ABNORMAL LOW (ref 44–60)
pH, Ven: 7.32 (ref 7.25–7.43)
pO2, Ven: <31 mmHg — CL (ref 32–45)

## 2023-04-03 LAB — LACTIC ACID, PLASMA
Lactic Acid, Venous: 4.4 mmol/L (ref 0.5–1.9)
Lactic Acid, Venous: 2.3 mmol/L (ref 0.5–1.9)
Lactic Acid, Venous: 3.5 mmol/L (ref 0.5–1.9)

## 2023-04-03 LAB — BASIC METABOLIC PANEL
Anion gap: 11 (ref 5–15)
BUN: 25 mg/dL — ABNORMAL HIGH (ref 8–23)
CO2: 14 mmol/L — ABNORMAL LOW (ref 22–32)
Calcium: 7.2 mg/dL — ABNORMAL LOW (ref 8.9–10.3)
Chloride: 111 mmol/L (ref 98–111)
Creatinine, Ser: 1.55 mg/dL — ABNORMAL HIGH (ref 0.61–1.24)
GFR, Estimated: 49 mL/min — ABNORMAL LOW (ref >60)
Glucose, Bld: 118 mg/dL — ABNORMAL HIGH (ref 70–99)
Potassium: 3.7 mmol/L (ref 3.5–5.1)
Sodium: 136 mmol/L (ref 135–145)

## 2023-04-03 LAB — ECHOCARDIOGRAM COMPLETE
AV Mean grad: 6 mmHg
AV Peak grad: 10.1 mmHg
Ao pk vel: 1.59 m/s
Area-P 1/2: 3.17 cm2
S' Lateral: 2.9 cm

## 2023-04-03 LAB — GLUCOSE, CAPILLARY
Glucose-Capillary: 154 mg/dL — ABNORMAL HIGH (ref 70–99)
Glucose-Capillary: 153 mg/dL — ABNORMAL HIGH (ref 70–99)

## 2023-04-03 LAB — BETA-HYDROXYBUTYRIC ACID: Beta-Hydroxybutyric Acid: 0.11 mmol/L (ref 0.05–0.27)

## 2023-04-03 LAB — TROPONIN I (HIGH SENSITIVITY): Troponin I (High Sensitivity): 7 ng/L (ref <18)

## 2023-04-03 MED ORDER — INSULIN ASPART 100 UNIT/ML IJ SOLN
0.0000 [IU] | Freq: Three times a day (TID) | INTRAMUSCULAR | Status: DC
  Filled 2023-04-03: qty 0.09

## 2023-04-03 MED ORDER — LACTATED RINGERS IV BOLUS
1000.0000 mL | Freq: Once | INTRAVENOUS | Status: AC
  Administered 2023-04-03: 1000 mL via INTRAVENOUS

## 2023-04-03 MED ORDER — ADULT MULTIVITAMIN W/MINERALS CH
1.0000 | ORAL_TABLET | Freq: Every day | ORAL | Status: DC
  Administered 2023-04-03 – 2023-04-05 (×3): 1 via ORAL
  Filled 2023-04-03 (×3): qty 1

## 2023-04-03 MED ORDER — INSULIN ASPART 100 UNIT/ML IJ SOLN
0.0000 [IU] | Freq: Every day | INTRAMUSCULAR | Status: DC
  Filled 2023-04-03: qty 0.05

## 2023-04-03 MED ORDER — LACTATED RINGERS IV BOLUS
500.0000 mL | Freq: Once | INTRAVENOUS | Status: AC
  Administered 2023-04-03: 500 mL via INTRAVENOUS

## 2023-04-03 MED ORDER — LORAZEPAM 2 MG/ML IJ SOLN
1.0000 mg | INTRAMUSCULAR | Status: DC | PRN
  Administered 2023-04-04: 1 mg via INTRAVENOUS
  Filled 2023-04-03: qty 1

## 2023-04-03 MED ORDER — SODIUM CHLORIDE 0.9 % IV SOLN: INTRAVENOUS | Status: AC

## 2023-04-03 MED ORDER — ACETAMINOPHEN 325 MG PO TABS
650.0000 mg | ORAL_TABLET | Freq: Four times a day (QID) | ORAL | Status: DC | PRN
  Administered 2023-04-04 – 2023-04-05 (×2): 650 mg via ORAL
  Filled 2023-04-03 (×2): qty 2

## 2023-04-03 MED ORDER — ASPIRIN 81 MG PO TBEC
81.0000 mg | DELAYED_RELEASE_TABLET | Freq: Every day | ORAL | Status: DC
  Administered 2023-04-03 – 2023-04-05 (×3): 81 mg via ORAL
  Filled 2023-04-03 (×3): qty 1

## 2023-04-03 MED ORDER — LORAZEPAM 1 MG PO TABS: 1.0000 mg | ORAL_TABLET | ORAL | Status: DC | PRN

## 2023-04-03 MED ORDER — FOLIC ACID 1 MG PO TABS
1.0000 mg | ORAL_TABLET | Freq: Every day | ORAL | Status: DC
  Administered 2023-04-03 – 2023-04-05 (×3): 1 mg via ORAL
  Filled 2023-04-03 (×3): qty 1

## 2023-04-03 MED ORDER — ACETAMINOPHEN 325 MG PO TABS
ORAL_TABLET | ORAL | Status: AC
  Administered 2023-04-03: 650 mg via ORAL
  Filled 2023-04-03: qty 2

## 2023-04-03 MED ORDER — THIAMINE MONONITRATE 100 MG PO TABS
100.0000 mg | ORAL_TABLET | Freq: Every day | ORAL | Status: DC
  Administered 2023-04-03 – 2023-04-05 (×3): 100 mg via ORAL
  Filled 2023-04-03 (×3): qty 1

## 2023-04-03 MED ORDER — VITAMIN D 25 MCG (1000 UNIT) PO TABS
1000.0000 [IU] | ORAL_TABLET | Freq: Every day | ORAL | Status: DC
  Administered 2023-04-03 – 2023-04-05 (×3): 1000 [IU] via ORAL
  Filled 2023-04-03 (×3): qty 1

## 2023-04-03 MED ORDER — VITAMIN B-12 1000 MCG PO TABS
1000.0000 ug | ORAL_TABLET | Freq: Every day | ORAL | Status: DC
  Administered 2023-04-03 – 2023-04-05 (×3): 1000 ug via ORAL
  Filled 2023-04-03 (×3): qty 1

## 2023-04-03 MED ORDER — BACITRACIN ZINC 500 UNIT/GM EX OINT
TOPICAL_OINTMENT | CUTANEOUS | Status: AC
  Filled 2023-04-03: qty 2.7

## 2023-04-03 MED ORDER — THIAMINE HCL 100 MG/ML IJ SOLN
100.0000 mg | Freq: Every day | INTRAMUSCULAR | Status: DC
  Filled 2023-04-03: qty 2

## 2023-04-03 MED ORDER — ATORVASTATIN CALCIUM 10 MG PO TABS
20.0000 mg | ORAL_TABLET | ORAL | Status: DC
  Administered 2023-04-05: 20 mg via ORAL
  Filled 2023-04-03: qty 2

## 2023-04-03 NOTE — Progress Notes (Signed)
Date and time results received: 04/03/23 at 1636 Results were called to [facility], RN on dayshift  Test: Lactic Acid Critical Value: 3.5  Name of Provider Notified: unknown if provider on dayshift was notified (no critical value note documented) J. Garner Nash, NP notified this evening at 2234 via secure chat.  Orders Received? Or Actions Taken?:  provider acknowledged result, no new orders at this time.

## 2023-04-03 NOTE — ED Notes (Signed)
ED TO INPATIENT HANDOFF REPORT  ED Nurse Name and Phone #: [place], RN  S Name/Age/Gender John Fisher 67 y.o. male Room/Bed: WA21/WA21  Code Status   Code Status: Prior  Home/SNF/Other Home Patient oriented to: x4 Is this baseline? Yes   Triage Complete: Triage complete  Chief Complaint Lactic acidosis [E87.20]  Triage Note PT was walking to car after drinking ETOH. Pt has hx of such and hematoma on back of head. Not on thinners. Fell backwards and hit head. Bystander endorsed LOC. C/o head and neck pain. Pt hypotensive on EMS arrival. They gave 400 cc fluid. CBG 189.    Allergies Allergies  Allergen Reactions   Trospium Other (See Comments)    Retention of urine   Alfuzosin Anxiety and Other (See Comments)    Increased frequency of urination, also    Level of Care/Admitting Diagnosis ED Disposition     ED Disposition  Admit   Condition  --   Comment  Hospital Area: [facility] [[id number]]  Level of Care: Progressive [102]  Admit to Progressive based on following criteria: MULTISYSTEM THREATS such as stable sepsis, metabolic/electrolyte imbalance with or without encephalopathy that is responding to early treatment.  May place patient in observation at [facility] or [facility] if equivalent level of care is available:: Yes  Covid Evaluation: Asymptomatic - no recent exposure (last 10 days) testing not required  Diagnosis: Lactic acidosis [884166]  Admitting Physician: John Giovanni [[id number]]  Attending Physician: John Giovanni [[id number]]          B Medical/Surgery History Past Medical History:  Diagnosis Date   Diabetes mellitus    Erectile dysfunction    Hypertension    Hypogonadism male    Osteoarthritis    Past Surgical History:  Procedure Laterality Date   left knee replacement  02/13/2016   REPLACEMENT TOTAL KNEE Right      A IV Location/Drains/Wounds Patient Lines/Drains/Airways Status     Active  Line/Drains/Airways     Name Placement date Placement time Site Days   Peripheral IV 04/02/23 20 G 1" Anterior;Proximal;Left Forearm 04/02/23  2132  Forearm  1            Intake/Output Last 24 hours  Intake/Output Summary (Last 24 hours) at 04/03/2023 5732 Last data filed at 04/03/2023 0544 Gross per 24 hour  Intake 3000 ml  Output 250 ml  Net 2750 ml    Labs/Imaging Results for orders placed or performed during the hospital encounter of 04/02/23 (from the past 48 hour(s))  Comprehensive metabolic panel     Status: Abnormal   Collection Time: 04/02/23  9:40 PM  Result Value Ref Range   Sodium 135 135 - 145 mmol/L   Potassium 4.4 3.5 - 5.1 mmol/L    Comment: HEMOLYSIS AT THIS LEVEL MAY AFFECT RESULT   Chloride 107 98 - 111 mmol/L   CO2 15 (L) 22 - 32 mmol/L   Glucose, Bld 154 (H) 70 - 99 mg/dL    Comment: Glucose reference range applies only to samples taken after fasting for at least 8 hours.   BUN 31 (H) 8 - 23 mg/dL   Creatinine, Ser 2.02 (H) 0.61 - 1.24 mg/dL   Calcium 8.0 (L) 8.9 - 10.3 mg/dL   Total Protein 6.9 6.5 - 8.1 g/dL   Albumin 3.0 (L) 3.5 - 5.0 g/dL   AST 62 (H) 15 - 41 U/L    Comment: HEMOLYSIS AT THIS LEVEL MAY AFFECT RESULT   ALT 41  0 - 44 U/L    Comment: HEMOLYSIS AT THIS LEVEL MAY AFFECT RESULT   Alkaline Phosphatase 68 38 - 126 U/L   Total Bilirubin 1.2 0.3 - 1.2 mg/dL    Comment: HEMOLYSIS AT THIS LEVEL MAY AFFECT RESULT   GFR, Estimated 35 (L) >60 mL/min    Comment: (NOTE) Calculated using the CKD-EPI Creatinine Equation (2021)    Anion gap 13 5 - 15    Comment: Performed at [facility], [address], [place], [place] [address]  Troponin I (High Sensitivity)     Status: None   Collection Time: 04/02/23  9:40 PM  Result Value Ref Range   Troponin I (High Sensitivity) 7 <18 ng/L    Comment: (NOTE) Elevated high sensitivity troponin I (hsTnI) values and significant  changes across serial measurements may suggest ACS but  many other  chronic and acute conditions are known to elevate hsTnI results.  Refer to the "Links" section for chest pain algorithms and additional  guidance. Performed at [facility], [address] [place], [place] [address]   CBC with Differential     Status: Abnormal   Collection Time: 04/02/23  9:40 PM  Result Value Ref Range   WBC 8.4 4.0 - 10.5 K/uL   RBC 3.67 (L) 4.22 - 5.81 MIL/uL   Hemoglobin 11.9 (L) 13.0 - 17.0 g/dL   HCT 09.8 (L) 11.9 - 14.7 %   MCV 101.1 (H) 80.0 - 100.0 fL   MCH 32.4 26.0 - 34.0 pg   MCHC 32.1 30.0 - 36.0 g/dL   RDW 82.9 56.2 - 13.0 %   Platelets 154 150 - 400 K/uL   nRBC 0.0 0.0 - 0.2 %   Neutrophils Relative % 56 %   Neutro Abs 4.7 1.7 - 7.7 K/uL   Lymphocytes Relative 37 %   Lymphs Abs 3.1 0.7 - 4.0 K/uL   Monocytes Relative 7 %   Monocytes Absolute 0.6 0.1 - 1.0 K/uL   Eosinophils Relative 0 %   Eosinophils Absolute 0.0 0.0 - 0.5 K/uL   Basophils Relative 0 %   Basophils Absolute 0.0 0.0 - 0.1 K/uL   Immature Granulocytes 0 %   Abs Immature Granulocytes 0.02 0.00 - 0.07 K/uL    Comment: Performed at [facility], [address], [place], [place] [address]  Ethanol     Status: Abnormal   Collection Time: 04/02/23  9:40 PM  Result Value Ref Range   Alcohol, Ethyl (B) 84 (H) <10 mg/dL    Comment: (NOTE) Lowest detectable limit for serum alcohol is 10 mg/dL.  For medical purposes only. Performed at [facility], [address], [place], [place] [address]   Basic metabolic panel     Status: Abnormal   Collection Time: 04/03/23 12:00 AM  Result Value Ref Range   Sodium 136 135 - 145 mmol/L   Potassium 3.7 3.5 - 5.1 mmol/L   Chloride 111 98 - 111 mmol/L   CO2 14 (L) 22 - 32 mmol/L   Glucose, Bld 118 (H) 70 - 99 mg/dL    Comment: Glucose reference range applies only to samples taken after fasting for at least 8 hours.   BUN 25 (H) 8 - 23 mg/dL   Creatinine, Ser 9.52 (H) 0.61 -  1.24 mg/dL   Calcium 7.2 (L) 8.9 - 10.3 mg/dL   GFR, Estimated 49 (L) >60 mL/min    Comment: (NOTE) Calculated using the CKD-EPI Creatinine Equation (2021)  Anion gap 11 5 - 15    Comment: Performed at [facility], [address] [place], [place] [address]  Troponin I (High Sensitivity)     Status: None   Collection Time: 04/03/23 12:00 AM  Result Value Ref Range   Troponin I (High Sensitivity) 7 <18 ng/L    Comment: (NOTE) Elevated high sensitivity troponin I (hsTnI) values and significant  changes across serial measurements may suggest ACS but many other  chronic and acute conditions are known to elevate hsTnI results.  Refer to the "Links" section for chest pain algorithms and additional  guidance. Performed at [facility], [address] [place], [place] [address]   Lactic acid, plasma     Status: Abnormal   Collection Time: 04/03/23  2:40 AM  Result Value Ref Range   Lactic Acid, Venous 4.4 (HH) 0.5 - 1.9 mmol/L    Comment: CRITICAL RESULT CALLED TO, READ BACK BY AND VERIFIED WITH HONEYCUTT B. @ 0319 04/03/23 MCLEAN K. Performed at [facility], [address], [place], [place] [address]   Blood gas, venous     Status: Abnormal   Collection Time: 04/03/23  3:15 AM  Result Value Ref Range   pH, Ven 7.32 7.25 - 7.43   pCO2, Ven 29 (L) 44 - 60 mmHg   pO2, Ven <31 (LL) 32 - 45 mmHg    Comment: RESULT CALLED TO, READ BACK BY AND VERIFIED WITH: Joretta Bachelor RN  @ [phone] 04/03/23  MCLEAN K.    Bicarbonate 14.9 (L) 20.0 - 28.0 mmol/L   Acid-base deficit 9.8 (H) 0.0 - 2.0 mmol/L   O2 Saturation 51.5 %   Patient temperature 37.0     Comment: Performed at [facility], [address], [place], [place] [address]   CT Head Wo Contrast  Result Date: 04/02/2023 CLINICAL DATA:  Trauma. EXAM: CT HEAD WITHOUT CONTRAST CT CERVICAL SPINE WITHOUT CONTRAST TECHNIQUE: Multidetector CT imaging of the head and  cervical spine was performed following the standard protocol without intravenous contrast. Multiplanar CT image reconstructions of the cervical spine were also generated. RADIATION DOSE REDUCTION: This exam was performed according to the departmental dose-optimization program which includes automated exposure control, adjustment of the mA and/or kV according to patient size and/or use of iterative reconstruction technique. COMPARISON:  Head CT dated 12/08/2022. FINDINGS: CT HEAD FINDINGS Brain: Mild age-related atrophy and chronic microvascular ischemic changes. There is no acute intracranial hemorrhage. No mass effect or midline shift. No extra-axial fluid collection. Vascular: No hyperdense vessel or unexpected calcification. Skull: Normal. Negative for fracture or focal lesion. Sinuses/Orbits: No acute finding. Other: Posterior scalp hematoma. CT CERVICAL SPINE FINDINGS Alignment: No acute subluxation. Skull base and vertebrae: No acute fracture. Soft tissues and spinal canal: No prevertebral fluid or swelling. No visible canal hematoma. Disc levels:  No acute findings.  Degenerative changes. Upper chest: Negative. Other: Mild bilateral carotid bulb calcified plaques. IMPRESSION: 1. No acute intracranial pathology. Mild age-related atrophy and chronic microvascular ischemic changes. 2. No acute/traumatic cervical spine pathology. Electronically Signed   By: Elgie Collard M.D.   On: 04/02/2023 23:12   CT Cervical Spine Wo Contrast  Result Date: 04/02/2023 CLINICAL DATA:  Trauma. EXAM: CT HEAD WITHOUT CONTRAST CT CERVICAL SPINE WITHOUT CONTRAST TECHNIQUE: Multidetector CT imaging of the head and cervical spine was performed following the standard protocol without intravenous contrast. Multiplanar CT image reconstructions of the cervical spine were also generated. RADIATION DOSE REDUCTION: This exam  was performed according to the departmental dose-optimization program which includes automated exposure  control, adjustment of the mA and/or kV according to patient size and/or use of iterative reconstruction technique. COMPARISON:  Head CT dated 12/08/2022. FINDINGS: CT HEAD FINDINGS Brain: Mild age-related atrophy and chronic microvascular ischemic changes. There is no acute intracranial hemorrhage. No mass effect or midline shift. No extra-axial fluid collection. Vascular: No hyperdense vessel or unexpected calcification. Skull: Normal. Negative for fracture or focal lesion. Sinuses/Orbits: No acute finding. Other: Posterior scalp hematoma. CT CERVICAL SPINE FINDINGS Alignment: No acute subluxation. Skull base and vertebrae: No acute fracture. Soft tissues and spinal canal: No prevertebral fluid or swelling. No visible canal hematoma. Disc levels:  No acute findings.  Degenerative changes. Upper chest: Negative. Other: Mild bilateral carotid bulb calcified plaques. IMPRESSION: 1. No acute intracranial pathology. Mild age-related atrophy and chronic microvascular ischemic changes. 2. No acute/traumatic cervical spine pathology. Electronically Signed   By: Elgie Collard M.D.   On: 04/02/2023 23:12   DG Knee Complete 4 Views Right  Result Date: 04/02/2023 CLINICAL DATA:  Syncope EXAM: RIGHT KNEE - COMPLETE 4+ VIEW COMPARISON:  None Available. FINDINGS: Right knee arthroplasty is present in anatomic alignment. There is no acute fracture or hardware loosening. Soft tissues are within normal limits. IMPRESSION: Right knee arthroplasty in anatomic alignment. Electronically Signed   By: Darliss Cheney M.D.   On: 04/02/2023 22:50   DG Chest 2 View  Result Date: 04/02/2023 CLINICAL DATA:  Syncope EXAM: CHEST - 2 VIEW COMPARISON:  Chest x-ray 03/18/2023.  CT of the chest 02/17/2022. FINDINGS: Vague focal density projects over the posterior right sixth rib, unchanged. The lungs are otherwise clear. No pleural effusion or pneumothorax. Cardiomediastinal silhouette is within normal limits. No acute fractures are seen.  IMPRESSION: 1. No acute cardiopulmonary process. 2. Stable focal density projecting over the posterior right sixth rib. Findings may represent a healed fracture, but other etiologies are not excluded. Follow-up CT of the chest recommended. Electronically Signed   By: Darliss Cheney M.D.   On: 04/02/2023 22:47    Pending Labs Unresulted Labs (From admission, onward)     Start     Ordered   04/03/23 0215  Lactic acid, plasma  Now then every 2 hours,   R (with STAT occurrences)      04/03/23 0214            Vitals/Pain Today's Vitals   04/03/23 0330 04/03/23 0400 04/03/23 0530 04/03/23 0543  BP: (!) 172/99 (!) 159/93 (!) 152/96   Pulse: 60 61 (!) 57   Resp:  15 16   Temp:    98.9 F (37.2 C)  TempSrc:    Oral  SpO2: 100% 99% 99%   PainSc:        Isolation Precautions No active isolations  Medications Medications  lactated ringers bolus 1,000 mL (0 mLs Intravenous Stopped 04/03/23 0007)  Tdap (BOOSTRIX) injection 0.5 mL (0.5 mLs Intramuscular Given 04/02/23 2356)  lactated ringers bolus 500 mL (0 mLs Intravenous Stopped 04/03/23 0116)  lactated ringers bolus 1,000 mL (0 mLs Intravenous Stopped 04/03/23 0402)  lactated ringers bolus 500 mL (0 mLs Intravenous Stopped 04/03/23 0402)  bacitracin 500 UNIT/GM ointment (  Given 04/03/23 0542)    Mobility walks       Neuro Assessment: Within Defined Limits  If patient is a Neuro Trauma and patient is going to OR before floor call report to 4N Charge nurse: [phone] or [phone]   R Recommendations: See  Admitting Provider Note  Report given to:   Additional Notes: Large hematoma to occipital scalp, no acute concern on CT. Required 1x assist for ambulation, constantly dizzy. Pleasant and A&Ox4. Uses a urinal.

## 2023-04-03 NOTE — Progress Notes (Signed)
This is 67 year old gentleman with history of type 2 diabetes mellitus, alcohol use disorder, orthostatic hypotension recurrent dizziness, recurrent falls and hospitalizations to once again was admitted under hospitalist service after midnight due to fall and syncope and was found to be orthostatic positive, was also found to have mild AKI on CKD stage IIIa.  Received some IV fluids.  Alcohol level was 95.  Patient seen and examined in the ED.  This is no charge note.  He is fully alert and oriented.  Per patient, he was in the rehabilitation for 31 days and was discharged just 2 weeks ago.  Per him, although he was improving but he did not think that he was well enough to go home and requested skilled nursing facility to extend his stay but he was told that he only gets 30 days.  He lives alone and has recurrent dizziness at home.  Continues to drink alcohol.  He says that he drinks only 2 shots a day but his alcohol level was 95 argues against his statement.  Source of his syncope sounds to be likely continued alcohol abuse with limited oral intake due to poor dentition leading to orthostatic hypotension in the setting of him continuing to take antihypertensive medications.  His AKI is improving.  We will continue to check his orthostatics every 8 hours.  Continue IV hydration.  Continue to hold antihypertensives.  He will be seen by PT OT.  Lactic acid was also elevated which we are checking.

## 2023-04-03 NOTE — ED Notes (Signed)
Per Crystal@ Main Lab- additional tube of VBG needed to complete testing. [employer]

## 2023-04-03 NOTE — ED Notes (Signed)
EDP (Dr.Rancour) notified of critical VBG results at this time.

## 2023-04-03 NOTE — ED Notes (Signed)
Ambulated pt in hallway, walks with shuffling gait but states this is normal. Reports dizziness, checked standing BP (SBP 137). Communicated with EDP

## 2023-04-03 NOTE — ED Notes (Signed)
ED TO INPATIENT HANDOFF REPORT  Name/Age/Gender John Fisher 67 y.o. male  Code Status    Code Status Orders  (From admission, onward)           Start     Ordered   04/03/23 0641  Full code  Continuous       Question:  By:  Answer:  Consent: discussion documented in EHR   04/03/23 0643           Code Status History     Date Active Date Inactive Code Status Order ID Comments User Context   12/08/2022 1543 12/09/2022 2107 Full Code [id number]  Lewie Chamber, MD ED   12/03/2022 2137 12/06/2022 1630 Full Code [id number]  Gery Pray, MD ED   08/24/2022 1437 09/02/2022 2205 Full Code [id number]  Elberta Fortis, MD ED   08/07/2015 2159 08/08/2015 1704 Full Code [id number]  Inez Catalina, MD ED       Home/SNF/Other Home  Chief Complaint Lactic acidosis [E87.20] Syncope [R55]  Level of Care/Admitting Diagnosis ED Disposition     ED Disposition  Admit   Condition  --   Comment  Hospital Area: [facility] [[id number]]  Level of Care: Med-Surg [16]  May place patient in observation at [facility] or [facility] if equivalent level of care is available:: No  Covid Evaluation: Asymptomatic - no recent exposure (last 10 days) testing not required  Diagnosis: Syncope [206001]  Admitting Physician: Hughie Closs [[id number]]  Attending Physician: Hughie Closs [phone]          Medical History Past Medical History:  Diagnosis Date   Diabetes mellitus    Erectile dysfunction    Hypertension    Hypogonadism male    Osteoarthritis     Allergies Allergies  Allergen Reactions   Trospium Other (See Comments)    Retention of urine   Alfuzosin Anxiety and Other (See Comments)    Increased frequency of urination, also    IV Location/Drains/Wounds Patient Lines/Drains/Airways Status     Active Line/Drains/Airways     Name Placement date Placement time Site Days   Peripheral IV 04/02/23 20 G 1" Anterior;Proximal;Left Forearm 04/02/23  2132   Forearm  1            Labs/Imaging Results for orders placed or performed during the hospital encounter of 04/02/23 (from the past 48 hour(s))  Comprehensive metabolic panel     Status: Abnormal   Collection Time: 04/02/23  9:40 PM  Result Value Ref Range   Sodium 135 135 - 145 mmol/L   Potassium 4.4 3.5 - 5.1 mmol/L    Comment: HEMOLYSIS AT THIS LEVEL MAY AFFECT RESULT   Chloride 107 98 - 111 mmol/L   CO2 15 (L) 22 - 32 mmol/L   Glucose, Bld 154 (H) 70 - 99 mg/dL    Comment: Glucose reference range applies only to samples taken after fasting for at least 8 hours.   BUN 31 (H) 8 - 23 mg/dL   Creatinine, Ser 0.27 (H) 0.61 - 1.24 mg/dL   Calcium 8.0 (L) 8.9 - 10.3 mg/dL   Total Protein 6.9 6.5 - 8.1 g/dL   Albumin 3.0 (L) 3.5 - 5.0 g/dL   AST 62 (H) 15 - 41 U/L    Comment: HEMOLYSIS AT THIS LEVEL MAY AFFECT RESULT   ALT 41 0 - 44 U/L    Comment: HEMOLYSIS AT THIS LEVEL MAY AFFECT RESULT   Alkaline Phosphatase 68 38 - 126 U/L  Total Bilirubin 1.2 0.3 - 1.2 mg/dL    Comment: HEMOLYSIS AT THIS LEVEL MAY AFFECT RESULT   GFR, Estimated 35 (L) >60 mL/min    Comment: (NOTE) Calculated using the CKD-EPI Creatinine Equation (2021)    Anion gap 13 5 - 15    Comment: Performed at [facility], [address], [place], [place] [address]  Troponin I (High Sensitivity)     Status: None   Collection Time: 04/02/23  9:40 PM  Result Value Ref Range   Troponin I (High Sensitivity) 7 <18 ng/L    Comment: (NOTE) Elevated high sensitivity troponin I (hsTnI) values and significant  changes across serial measurements may suggest ACS but many other  chronic and acute conditions are known to elevate hsTnI results.  Refer to the "Links" section for chest pain algorithms and additional  guidance. Performed at [facility], [address], [place], [place] [address]   CBC with Differential     Status: Abnormal   Collection Time: 04/02/23  9:40 PM  Result  Value Ref Range   WBC 8.4 4.0 - 10.5 K/uL   RBC 3.67 (L) 4.22 - 5.81 MIL/uL   Hemoglobin 11.9 (L) 13.0 - 17.0 g/dL   HCT 09.8 (L) 11.9 - 14.7 %   MCV 101.1 (H) 80.0 - 100.0 fL   MCH 32.4 26.0 - 34.0 pg   MCHC 32.1 30.0 - 36.0 g/dL   RDW 82.9 56.2 - 13.0 %   Platelets 154 150 - 400 K/uL   nRBC 0.0 0.0 - 0.2 %   Neutrophils Relative % 56 %   Neutro Abs 4.7 1.7 - 7.7 K/uL   Lymphocytes Relative 37 %   Lymphs Abs 3.1 0.7 - 4.0 K/uL   Monocytes Relative 7 %   Monocytes Absolute 0.6 0.1 - 1.0 K/uL   Eosinophils Relative 0 %   Eosinophils Absolute 0.0 0.0 - 0.5 K/uL   Basophils Relative 0 %   Basophils Absolute 0.0 0.0 - 0.1 K/uL   Immature Granulocytes 0 %   Abs Immature Granulocytes 0.02 0.00 - 0.07 K/uL    Comment: Performed at [facility], [address] [place], [place] [address]  Ethanol     Status: Abnormal   Collection Time: 04/02/23  9:40 PM  Result Value Ref Range   Alcohol, Ethyl (B) 84 (H) <10 mg/dL    Comment: (NOTE) Lowest detectable limit for serum alcohol is 10 mg/dL.  For medical purposes only. Performed at [facility], [address], [place], [place] [address]   Basic metabolic panel     Status: Abnormal   Collection Time: 04/03/23 12:00 AM  Result Value Ref Range   Sodium 136 135 - 145 mmol/L   Potassium 3.7 3.5 - 5.1 mmol/L   Chloride 111 98 - 111 mmol/L   CO2 14 (L) 22 - 32 mmol/L   Glucose, Bld 118 (H) 70 - 99 mg/dL    Comment: Glucose reference range applies only to samples taken after fasting for at least 8 hours.   BUN 25 (H) 8 - 23 mg/dL   Creatinine, Ser 9.52 (H) 0.61 - 1.24 mg/dL   Calcium 7.2 (L) 8.9 - 10.3 mg/dL   GFR, Estimated 49 (L) >60 mL/min    Comment: (NOTE) Calculated using the CKD-EPI Creatinine Equation (2021)    Anion gap 11 5 - 15    Comment: Performed at [facility], [address], [place], [place] [address]  Troponin I (High Sensitivity)     Status: None    Collection Time: 04/03/23 12:00 AM  Result Value Ref Range   Troponin I (High Sensitivity) 7 <18 ng/L    Comment: (NOTE) Elevated high sensitivity troponin I (hsTnI) values and significant  changes across serial measurements may suggest ACS but many other  chronic and acute conditions are known to elevate hsTnI results.  Refer to the "Links" section for chest pain algorithms and additional  guidance. Performed at [facility], [address] [place], [place] [address]   Lactic acid, plasma     Status: Abnormal   Collection Time: 04/03/23  2:40 AM  Result Value Ref Range   Lactic Acid, Venous 4.4 (HH) 0.5 - 1.9 mmol/L    Comment: CRITICAL RESULT CALLED TO, READ BACK BY AND VERIFIED WITH HONEYCUTT B. @ 0319 04/03/23 MCLEAN K. Performed at [facility], [address] [place], [place] [address]   Blood gas, venous     Status: Abnormal   Collection Time: 04/03/23  3:15 AM  Result Value Ref Range   pH, Ven 7.32 7.25 - 7.43   pCO2, Ven 29 (L) 44 - 60 mmHg   pO2, Ven <31 (LL) 32 - 45 mmHg    Comment: RESULT CALLED TO, READ BACK BY AND VERIFIED WITH: Joretta Bachelor RN  @ [phone] 04/03/23  MCLEAN K.    Bicarbonate 14.9 (L) 20.0 - 28.0 mmol/L   Acid-base deficit 9.8 (H) 0.0 - 2.0 mmol/L   O2 Saturation 51.5 %   Patient temperature 37.0     Comment: Performed at [facility], [address], [place], [place] [address]  Comprehensive metabolic panel     Status: Abnormal   Collection Time: 04/03/23  7:00 AM  Result Value Ref Range   Sodium 134 (L) 135 - 145 mmol/L   Potassium 3.7 3.5 - 5.1 mmol/L   Chloride 105 98 - 111 mmol/L   CO2 22 22 - 32 mmol/L   Glucose, Bld 133 (H) 70 - 99 mg/dL    Comment: Glucose reference range applies only to samples taken after fasting for at least 8 hours.   BUN 24 (H) 8 - 23 mg/dL   Creatinine, Ser 8.29 (H) 0.61 - 1.24 mg/dL   Calcium 8.1 (L) 8.9 - 10.3 mg/dL   Total Protein 6.3 (L) 6.5 - 8.1 g/dL    Albumin 2.7 (L) 3.5 - 5.0 g/dL   AST 48 (H) 15 - 41 U/L   ALT 39 0 - 44 U/L   Alkaline Phosphatase 58 38 - 126 U/L   Total Bilirubin 0.9 0.3 - 1.2 mg/dL   GFR, Estimated 43 (L) >60 mL/min    Comment: (NOTE) Calculated using the CKD-EPI Creatinine Equation (2021)    Anion gap 7 5 - 15    Comment: Performed at [facility], [address] [place], [place] [address]  Urinalysis, Routine w reflex microscopic -Urine, Clean Catch     Status: Abnormal   Collection Time: 04/03/23  7:27 AM  Result Value Ref Range   Color, Urine STRAW (A) YELLOW   APPearance CLEAR CLEAR   Specific Gravity, Urine 1.011 1.005 - 1.030   pH 6.0 5.0 - 8.0   Glucose, UA 50 (A) NEGATIVE mg/dL   Hgb urine dipstick NEGATIVE NEGATIVE   Bilirubin Urine NEGATIVE NEGATIVE   Ketones, ur NEGATIVE NEGATIVE mg/dL   Protein, ur NEGATIVE NEGATIVE mg/dL   Nitrite NEGATIVE NEGATIVE  Leukocytes,Ua NEGATIVE NEGATIVE    Comment: Performed at [facility], [address], [place], [place] [address]  CBG monitoring, ED     Status: Abnormal   Collection Time: 04/03/23  7:38 AM  Result Value Ref Range   Glucose-Capillary 120 (H) 70 - 99 mg/dL    Comment: Glucose reference range applies only to samples taken after fasting for at least 8 hours.  Beta-hydroxybutyric acid     Status: None   Collection Time: 04/03/23  9:15 AM  Result Value Ref Range   Beta-Hydroxybutyric Acid 0.11 0.05 - 0.27 mmol/L    Comment: Performed at [facility], [address], [place], [place] [address]   CT CHEST WO CONTRAST  Result Date: 04/03/2023 CLINICAL DATA:  Abnormal x-ray, lung nodule. EXAM: CT CHEST WITHOUT CONTRAST TECHNIQUE: Multidetector CT imaging of the chest was performed following the standard protocol without IV contrast. RADIATION DOSE REDUCTION: This exam was performed according to the departmental dose-optimization program which includes automated exposure control, adjustment of  the mA and/or kV according to patient size and/or use of iterative reconstruction technique. COMPARISON:  Chest radiograph from yesterday. FINDINGS: Cardiovascular: Normal heart size. No pericardial effusion. Atheromatous calcification of the aorta and coronaries. Mediastinum/Nodes: Negative for mass or adenopathy. Lungs/Pleura: Mild dependent atelectasis. There is no edema, consolidation, effusion, or pneumothorax. No suspicious nodule, a 3 mm subpleural nodule at the right middle lobe is stable since June 2023. No follow-up recommended. Upper Abdomen: Negative. Musculoskeletal: Healing posterior right sixth rib fracture correlates with the chest radiograph findings. The fracture is segmental. Thoracic spondylosis. IMPRESSION: Radiographic abnormality correlates with healing right sixth rib fractures. No suspicious nodule or evidence of active cardiopulmonary disease. Electronically Signed   By: Tiburcio Pea M.D.   On: 04/03/2023 10:41   CT Head Wo Contrast  Result Date: 04/02/2023 CLINICAL DATA:  Trauma. EXAM: CT HEAD WITHOUT CONTRAST CT CERVICAL SPINE WITHOUT CONTRAST TECHNIQUE: Multidetector CT imaging of the head and cervical spine was performed following the standard protocol without intravenous contrast. Multiplanar CT image reconstructions of the cervical spine were also generated. RADIATION DOSE REDUCTION: This exam was performed according to the departmental dose-optimization program which includes automated exposure control, adjustment of the mA and/or kV according to patient size and/or use of iterative reconstruction technique. COMPARISON:  Head CT dated 12/08/2022. FINDINGS: CT HEAD FINDINGS Brain: Mild age-related atrophy and chronic microvascular ischemic changes. There is no acute intracranial hemorrhage. No mass effect or midline shift. No extra-axial fluid collection. Vascular: No hyperdense vessel or unexpected calcification. Skull: Normal. Negative for fracture or focal lesion.  Sinuses/Orbits: No acute finding. Other: Posterior scalp hematoma. CT CERVICAL SPINE FINDINGS Alignment: No acute subluxation. Skull base and vertebrae: No acute fracture. Soft tissues and spinal canal: No prevertebral fluid or swelling. No visible canal hematoma. Disc levels:  No acute findings.  Degenerative changes. Upper chest: Negative. Other: Mild bilateral carotid bulb calcified plaques. IMPRESSION: 1. No acute intracranial pathology. Mild age-related atrophy and chronic microvascular ischemic changes. 2. No acute/traumatic cervical spine pathology. Electronically Signed   By: Elgie Collard M.D.   On: 04/02/2023 23:12   CT Cervical Spine Wo Contrast  Result Date: 04/02/2023 CLINICAL DATA:  Trauma. EXAM: CT HEAD WITHOUT CONTRAST CT CERVICAL SPINE WITHOUT CONTRAST TECHNIQUE: Multidetector CT imaging of the head and cervical spine was performed following the standard protocol without intravenous contrast. Multiplanar CT image reconstructions of the cervical spine were also generated. RADIATION DOSE REDUCTION: This exam was performed according to  the departmental dose-optimization program which includes automated exposure control, adjustment of the mA and/or kV according to patient size and/or use of iterative reconstruction technique. COMPARISON:  Head CT dated 12/08/2022. FINDINGS: CT HEAD FINDINGS Brain: Mild age-related atrophy and chronic microvascular ischemic changes. There is no acute intracranial hemorrhage. No mass effect or midline shift. No extra-axial fluid collection. Vascular: No hyperdense vessel or unexpected calcification. Skull: Normal. Negative for fracture or focal lesion. Sinuses/Orbits: No acute finding. Other: Posterior scalp hematoma. CT CERVICAL SPINE FINDINGS Alignment: No acute subluxation. Skull base and vertebrae: No acute fracture. Soft tissues and spinal canal: No prevertebral fluid or swelling. No visible canal hematoma. Disc levels:  No acute findings.  Degenerative  changes. Upper chest: Negative. Other: Mild bilateral carotid bulb calcified plaques. IMPRESSION: 1. No acute intracranial pathology. Mild age-related atrophy and chronic microvascular ischemic changes. 2. No acute/traumatic cervical spine pathology. Electronically Signed   By: Elgie Collard M.D.   On: 04/02/2023 23:12   DG Knee Complete 4 Views Right  Result Date: 04/02/2023 CLINICAL DATA:  Syncope EXAM: RIGHT KNEE - COMPLETE 4+ VIEW COMPARISON:  None Available. FINDINGS: Right knee arthroplasty is present in anatomic alignment. There is no acute fracture or hardware loosening. Soft tissues are within normal limits. IMPRESSION: Right knee arthroplasty in anatomic alignment. Electronically Signed   By: Darliss Cheney M.D.   On: 04/02/2023 22:50   DG Chest 2 View  Result Date: 04/02/2023 CLINICAL DATA:  Syncope EXAM: CHEST - 2 VIEW COMPARISON:  Chest x-ray 03/18/2023.  CT of the chest 02/17/2022. FINDINGS: Vague focal density projects over the posterior right sixth rib, unchanged. The lungs are otherwise clear. No pleural effusion or pneumothorax. Cardiomediastinal silhouette is within normal limits. No acute fractures are seen. IMPRESSION: 1. No acute cardiopulmonary process. 2. Stable focal density projecting over the posterior right sixth rib. Findings may represent a healed fracture, but other etiologies are not excluded. Follow-up CT of the chest recommended. Electronically Signed   By: Darliss Cheney M.D.   On: 04/02/2023 22:47    Pending Labs Unresulted Labs (From admission, onward)     Start     Ordered   04/03/23 0642  Hemoglobin A1c  Once,   R       Comments: To assess prior glycemic control    04/03/23 0643   04/03/23 0215  Lactic acid, plasma  Now then every 2 hours,   R (with STAT occurrences)      04/03/23 0214            Vitals/Pain Today's Vitals   04/03/23 0600 04/03/23 0630 04/03/23 0932 04/03/23 0950  BP: (!) 163/96 (!) 170/97  (!) 171/109  Pulse: (!) 59 69 65 60   Resp: 20 12 14 16   Temp:   98.2 F (36.8 C)   TempSrc:   Oral   SpO2: 100% 100% 100% 99%  PainSc:        Isolation Precautions No active isolations  Medications Medications  insulin aspart (novoLOG) injection 0-9 Units ( Subcutaneous Not Given 04/03/23 0809)  insulin aspart (novoLOG) injection 0-5 Units (has no administration in time range)  0.9 %  sodium chloride infusion ( Intravenous New Bag/Given 04/03/23 0701)  LORazepam (ATIVAN) tablet 1-4 mg (has no administration in time range)    Or  LORazepam (ATIVAN) injection 1-4 mg (has no administration in time range)  thiamine (VITAMIN B1) tablet 100 mg (100 mg Oral Given 04/03/23 1024)    Or  thiamine (VITAMIN B1) injection 100  mg ( Intravenous See Alternative 04/03/23 1024)  folic acid (FOLVITE) tablet 1 mg (1 mg Oral Given 04/03/23 1024)  multivitamin with minerals tablet 1 tablet (1 tablet Oral Given 04/03/23 1024)  aspirin EC tablet 81 mg (81 mg Oral Given 04/03/23 1024)  atorvastatin (LIPITOR) tablet 20 mg (has no administration in time range)  cyanocobalamin (VITAMIN B12) tablet 1,000 mcg (1,000 mcg Oral Given 04/03/23 1024)  cholecalciferol (VITAMIN D3) 25 MCG (1000 UNIT) tablet 1,000 Units (1,000 Units Oral Given 04/03/23 1024)  acetaminophen (TYLENOL) tablet 650 mg (650 mg Oral Given 04/03/23 0910)  lactated ringers bolus 1,000 mL (0 mLs Intravenous Stopped 04/03/23 0007)  Tdap (BOOSTRIX) injection 0.5 mL (0.5 mLs Intramuscular Given 04/02/23 2356)  lactated ringers bolus 500 mL (0 mLs Intravenous Stopped 04/03/23 0116)  lactated ringers bolus 1,000 mL (0 mLs Intravenous Stopped 04/03/23 0402)  lactated ringers bolus 500 mL (0 mLs Intravenous Stopped 04/03/23 0402)  bacitracin 500 UNIT/GM ointment (  Given 04/03/23 0542)    Mobility walks

## 2023-04-03 NOTE — ED Provider Notes (Signed)
Care assumed from Dr. Earlene Plater.  Patient here with syncope versus fall while intoxicated.  Did hit his head.  Has had several episodes of near loss of consciousness today.  Traumatic imaging is negative.  Awaiting repeat metabolic profile after hydration due to metabolic acidosis.  Repeat chemistry shows improvement in creatinine but progressively worsening bicarbonate.  Will check VBG.  Plan admission for continued treatment of metabolic acidosis without anion gap.  Discussed with Dr. Loney Loh.    John Octave, MD 04/03/23 [phone]

## 2023-04-03 NOTE — H&P (Signed)
History and Physical    John Fisher:[id number] DOB: 1956-01-05 DOA: 04/02/2023  PCP: [facility]  Patient coming from: Home  Chief Complaint: Fall  HPI: John Fisher is a 67 y.o. male with medical history significant of type 2 diabetes, hypertension, osteoarthritis, alcohol use disorder, orthostatic hypotension presented to ED via EMS after episode of syncope versus fall while intoxicated.  He did hit his head.  Patient was hypotensive with EMS and was given 400 cc IV fluids.  CBG was 189.  He endorsed head/neck pain and right knee pain.  In the ED, patient was borderline bradycardic with heart rate in the high 50s/low 60s.  Orthostatics positive (SBP dropped 20 mmHg when going from lying to sitting position).  Labs showing no leukocytosis, hemoglobin 11.9 (stable), platelet count 154k, bicarb 15, anion gap 13, glucose 154, BUN 31, creatinine 2.0 (baseline 1.3-1.5), AST 62 and remainder of LFTs normal, troponin negative x 2, blood alcohol level 84, lactic acid 4.4, VBG showing normal pH.  Chest x-ray showing no acute cardiopulmonary process.  Showing a stable focal density projecting over the posterior right sixth rib, findings may represent a healed fracture but other etiologies are not excluded.  Radiologist recommending follow-up CT chest for further evaluation.  CT head/C-spine and x-ray of right knee negative for acute findings.  Patient was initially given 1 L LR.  Repeat labs showing improvement of creatinine to 1.5 but bicarb remaining low at 14.  Subsequently given additional 2 L LR.  Patient also received Tdap injection in the ED.  Patient reports poor p.o. intake/difficulty eating due to chronic dental issues.  He has an upcoming appointment with a dentist in a week.  States yesterday he was feeling well during the day and in the evening as he was walking from his house to his car to go somewhere, all of a sudden he felt dizzy and then passed out.  When he regained  consciousness he was on the floor and as he tried to get up, he passed out again twice.  Patient states he normally feels dizzy whenever he gets up too quickly.  Due to this problem, he is no longer on any blood pressure medications.  He does consume alcohol and had a shot of alcohol yesterday evening before this event.  Denies any recent illness.  Denies fevers, cough, shortness of breath, chest pain, palpitations, nausea, vomiting, abdominal pain, diarrhea, or any urinary symptoms.  Review of Systems:  Review of Systems  All other systems reviewed and are negative.   Past Medical History:  Diagnosis Date   Diabetes mellitus    Erectile dysfunction    Hypertension    Hypogonadism male    Osteoarthritis     Past Surgical History:  Procedure Laterality Date   left knee replacement  02/13/2016   REPLACEMENT TOTAL KNEE Right      reports that he has never smoked. He has never used smokeless tobacco. He reports current alcohol use of about 14.0 standard drinks of alcohol per week. He reports current drug use. Drug: Marijuana.  Allergies  Allergen Reactions   Trospium Other (See Comments)    Retention of urine   Alfuzosin Anxiety and Other (See Comments)    Increased frequency of urination, also    Family History  Problem Relation Age of Onset   Diabetes Mother    Hypertension Father    Diabetes Father    Diabetes Brother     Prior to Admission medications  Medication Sig Start Date End Date Taking? Authorizing Provider  aspirin EC 81 MG tablet Take 81 mg by mouth daily.   Yes [provider]  atorvastatin (LIPITOR) 20 MG tablet Take 20 mg by mouth 2 (two) times a week.   Yes [provider]  cholecalciferol (VITAMIN D3) 25 MCG (1000 UNIT) tablet Take 1,000 Units by mouth daily.   Yes [provider]  cyanocobalamin (VITAMIN B12) 1000 MCG tablet Take 1,000 mcg by mouth daily.   Yes [provider]  diclofenac Sodium (VOLTAREN) 1 % GEL  Apply 2 g topically 4 (four) times daily as needed (for pain- affected areas).   Yes [provider]  DULoxetine (CYMBALTA) 60 MG capsule Take 60 mg by mouth daily.   Yes [provider]  fluticasone (FLONASE) 50 MCG/ACT nasal spray Place 1 spray into both nostrils 2 (two) times daily as needed for allergies or rhinitis. Patient taking differently: Place 2 sprays into both nostrils in the morning and at bedtime. 09/11/20  Yes Ellamae Sia, DO  metFORMIN (GLUCOPHAGE) 500 MG tablet Take 500 mg by mouth daily with breakfast.   Yes [provider]  naloxone (NARCAN) nasal spray 4 mg/0.1 mL Place 1 spray into the nose once as needed (for accidental overdose). 01/12/22  Yes [provider]  Nutritional Supplements (ENSURE PLUS PO) Take 1 Can by mouth in the morning and at bedtime.   Yes [provider]  valsartan (DIOVAN) 160 MG tablet Take 80 mg by mouth in the morning.   Yes [provider]    Physical Exam: Vitals:   04/03/23 0230 04/03/23 0300 04/03/23 0330 04/03/23 0400  BP: (!) 153/93 (!) 169/94 (!) 172/99 (!) 159/93  Pulse: 61 62 60 61  Resp: (!) 23 20  15   Temp:      TempSrc:      SpO2: 100% 100% 100% 99%    Physical Exam Vitals reviewed.  Constitutional:      General: He is not in acute distress. HENT:     Head: Normocephalic.     Mouth/Throat:     Mouth: Mucous membranes are dry.  Eyes:     Extraocular Movements: Extraocular movements intact.  Cardiovascular:     Rate and Rhythm: Normal rate and regular rhythm.     Pulses: Normal pulses.  Pulmonary:     Effort: Pulmonary effort is normal. No respiratory distress.     Breath sounds: Normal breath sounds. No wheezing or rales.  Abdominal:     General: Bowel sounds are normal. There is no distension.     Palpations: Abdomen is soft.     Tenderness: There is no abdominal tenderness.  Musculoskeletal:     Cervical back: Normal range of motion.     Right lower leg: No edema.      Left lower leg: No edema.  Skin:    General: Skin is warm and dry.  Neurological:     General: No focal deficit present.     Mental Status: He is alert and oriented to person, place, and time.     Labs on Admission: I have personally reviewed following labs and imaging studies  CBC: Recent Labs  Lab 04/02/23 2140  WBC 8.4  NEUTROABS 4.7  HGB 11.9*  HCT 37.1*  MCV 101.1*  PLT 154   Basic Metabolic Panel: Recent Labs  Lab 04/02/23 2140 04/03/23 0000  NA 135 136  K 4.4 3.7  CL 107 111  CO2 15* 14*  GLUCOSE 154* 118*  BUN 31* 25*  CREATININE 2.04* 1.55*  CALCIUM 8.0* 7.2*   GFR: CrCl cannot be calculated (Unknown ideal weight.). Liver Function Tests: Recent Labs  Lab 04/02/23 2140  AST 62*  ALT 41  ALKPHOS 68  BILITOT 1.2  PROT 6.9  ALBUMIN 3.0*   No results for input(s): "LIPASE", "AMYLASE" in the last 168 hours. No results for input(s): "AMMONIA" in the last 168 hours. Coagulation Profile: No results for input(s): "INR", "PROTIME" in the last 168 hours. Cardiac Enzymes: No results for input(s): "CKTOTAL", "CKMB", "CKMBINDEX", "TROPONINI" in the last 168 hours. BNP (last 3 results) No results for input(s): "PROBNP" in the last 8760 hours. HbA1C: No results for input(s): "HGBA1C" in the last 72 hours. CBG: No results for input(s): "GLUCAP" in the last 168 hours. Lipid Profile: No results for input(s): "CHOL", "HDL", "LDLCALC", "TRIG", "CHOLHDL", "LDLDIRECT" in the last 72 hours. Thyroid Function Tests: No results for input(s): "TSH", "T4TOTAL", "FREET4", "T3FREE", "THYROIDAB" in the last 72 hours. Anemia Panel: No results for input(s): "VITAMINB12", "FOLATE", "FERRITIN", "TIBC", "IRON", "RETICCTPCT" in the last 72 hours. Urine analysis:    Component Value Date/Time   COLORURINE YELLOW 12/08/2022 1306   APPEARANCEUR CLEAR 12/08/2022 1306   LABSPEC 1.014 12/08/2022 1306   PHURINE 6.0 12/08/2022 1306   GLUCOSEU NEGATIVE 12/08/2022 1306    GLUCOSEU NEGATIVE 02/15/2013 1635   HGBUR NEGATIVE 12/08/2022 1306   BILIRUBINUR NEGATIVE 12/08/2022 1306   KETONESUR 5 (A) 12/08/2022 1306   PROTEINUR NEGATIVE 12/08/2022 1306   UROBILINOGEN 0.2 02/15/2013 1635   NITRITE NEGATIVE 12/08/2022 1306   LEUKOCYTESUR NEGATIVE 12/08/2022 1306    Radiological Exams on Admission: CT Head Wo Contrast  Result Date: 04/02/2023 CLINICAL DATA:  Trauma. EXAM: CT HEAD WITHOUT CONTRAST CT CERVICAL SPINE WITHOUT CONTRAST TECHNIQUE: Multidetector CT imaging of the head and cervical spine was performed following the standard protocol without intravenous contrast. Multiplanar CT image reconstructions of the cervical spine were also generated. RADIATION DOSE REDUCTION: This exam was performed according to the departmental dose-optimization program which includes automated exposure control, adjustment of the mA and/or kV according to patient size and/or use of iterative reconstruction technique. COMPARISON:  Head CT dated 12/08/2022. FINDINGS: CT HEAD FINDINGS Brain: Mild age-related atrophy and chronic microvascular ischemic changes. There is no acute intracranial hemorrhage. No mass effect or midline shift. No extra-axial fluid collection. Vascular: No hyperdense vessel or unexpected calcification. Skull: Normal. Negative for fracture or focal lesion. Sinuses/Orbits: No acute finding. Other: Posterior scalp hematoma. CT CERVICAL SPINE FINDINGS Alignment: No acute subluxation. Skull base and vertebrae: No acute fracture. Soft tissues and spinal canal: No prevertebral fluid or swelling. No visible canal hematoma. Disc levels:  No acute findings.  Degenerative changes. Upper chest: Negative. Other: Mild bilateral carotid bulb calcified plaques. IMPRESSION: 1. No acute intracranial pathology. Mild age-related atrophy and chronic microvascular ischemic changes. 2. No acute/traumatic cervical spine pathology. Electronically Signed   By: Elgie Collard M.D.   On: 04/02/2023  23:12   CT Cervical Spine Wo Contrast  Result Date: 04/02/2023 CLINICAL DATA:  Trauma. EXAM: CT HEAD WITHOUT CONTRAST CT CERVICAL SPINE WITHOUT CONTRAST TECHNIQUE: Multidetector CT imaging of the head and cervical spine was performed following the standard protocol without intravenous contrast. Multiplanar CT image reconstructions of the cervical spine were also generated. RADIATION DOSE REDUCTION: This exam was performed according to the departmental dose-optimization program which includes automated exposure control, adjustment of the mA and/or kV according to patient size and/or use of iterative reconstruction  technique. COMPARISON:  Head CT dated 12/08/2022. FINDINGS: CT HEAD FINDINGS Brain: Mild age-related atrophy and chronic microvascular ischemic changes. There is no acute intracranial hemorrhage. No mass effect or midline shift. No extra-axial fluid collection. Vascular: No hyperdense vessel or unexpected calcification. Skull: Normal. Negative for fracture or focal lesion. Sinuses/Orbits: No acute finding. Other: Posterior scalp hematoma. CT CERVICAL SPINE FINDINGS Alignment: No acute subluxation. Skull base and vertebrae: No acute fracture. Soft tissues and spinal canal: No prevertebral fluid or swelling. No visible canal hematoma. Disc levels:  No acute findings.  Degenerative changes. Upper chest: Negative. Other: Mild bilateral carotid bulb calcified plaques. IMPRESSION: 1. No acute intracranial pathology. Mild age-related atrophy and chronic microvascular ischemic changes. 2. No acute/traumatic cervical spine pathology. Electronically Signed   By: Elgie Collard M.D.   On: 04/02/2023 23:12   DG Knee Complete 4 Views Right  Result Date: 04/02/2023 CLINICAL DATA:  Syncope EXAM: RIGHT KNEE - COMPLETE 4+ VIEW COMPARISON:  None Available. FINDINGS: Right knee arthroplasty is present in anatomic alignment. There is no acute fracture or hardware loosening. Soft tissues are within normal limits.  IMPRESSION: Right knee arthroplasty in anatomic alignment. Electronically Signed   By: Darliss Cheney M.D.   On: 04/02/2023 22:50   DG Chest 2 View  Result Date: 04/02/2023 CLINICAL DATA:  Syncope EXAM: CHEST - 2 VIEW COMPARISON:  Chest x-ray 03/18/2023.  CT of the chest 02/17/2022. FINDINGS: Vague focal density projects over the posterior right sixth rib, unchanged. The lungs are otherwise clear. No pleural effusion or pneumothorax. Cardiomediastinal silhouette is within normal limits. No acute fractures are seen. IMPRESSION: 1. No acute cardiopulmonary process. 2. Stable focal density projecting over the posterior right sixth rib. Findings may represent a healed fracture, but other etiologies are not excluded. Follow-up CT of the chest recommended. Electronically Signed   By: Darliss Cheney M.D.   On: 04/02/2023 22:47    EKG: Independently reviewed. Sinus rhythm, LVH. No significant change since prior tracing.  Assessment and Plan  Syncope Likely related to orthostatic hypotension and alcohol intoxication.  Blood alcohol level was elevated and also orthostatics positive (SBP dropped 20 mmHg when going from lying to sitting position).  Patient told me he does not take any antihypertensives but per pharmacy med rec, he is not valsartan 80 mg daily.  Only borderline bradycardic in the ED with heart rate in the high 50s/low 60s and EKG showing sinus rhythm.  Not on any AV nodal blocking agents.  Troponin negative x 2.  Last echo done in December 2023 was showing normal EF, trivial MVR, trivial AVR, and no MVS or AVS.  PE less likely given no tachycardia or chest pain.  Continue cardiac monitoring, IV fluid hydration, and repeat orthostatics in the morning.  Hold valsartan at this time and consider discontinuing given known history of orthostatic hypotension and previous hospitalizations for this problem.  PT/OT evaluation, fall precautions.  Lactic acidosis/metabolic acidosis Suspecting alcoholic  ketoacidosis.  Lactate 4.4 but no infectious signs or symptoms..  Bicarb 14 with normal anion gap and no significant hyperglycemia.  VBG showing normal pH.  Continue IV fluid hydration and trend lactate and bicarb.  Check beta hydroxybutyric acid level.  AKI Likely prerenal from dehydration.  Creatinine 2.0 on initial labs, baseline 1.3-1.5.  Creatinine has improved to 1.5 on repeat labs after IV fluids.  Alcohol use disorder CIWA protocol; Ativan as needed.  Thiamine, folate, and multivitamin.  Abnormal chest x-ray finding Chest x-ray showing a stable focal density  projecting over the posterior right sixth rib, findings may represent a healed fracture but other etiologies are not excluded.  CT chest ordered for further evaluation.  ?Hypocalcemia Calcium was 8.0 with albumin 3.0 on initial labs.  Calcium 7.2 on labs after IV fluid boluses.  Repeat labs to confirm.  Type 2 diabetes A1c 5.2 in December 2023, will repeat.  Sensitive sliding scale insulin ACHS.  DVT prophylaxis: SCDs Code Status: Full Code (discussed with the patient) Family Communication: No family available at this time. Level of care: Step Down Unit Admission status: It is my clinical opinion that referral for OBSERVATION is reasonable and necessary in this patient based on the above information provided. The aforementioned taken together are felt to place the patient at high risk for further clinical deterioration. However, it is anticipated that the patient may be medically stable for discharge from the hospital within 24 to 48 hours.   John Giovanni MD [facility]  If 7PM-7AM, please contact night-coverage [email or web address]  04/03/2023, 5:39 AM

## 2023-04-03 NOTE — ED Notes (Signed)
EDP notified of lactic 4.4 at this time. Plan to give 3L LR total, previously 2.5L ordered.

## 2023-04-03 NOTE — Plan of Care (Signed)
  Problem: Education: Goal: Knowledge of General Education information will improve Description: Including pain rating scale, medication(s)/side effects and non-pharmacologic comfort measures Outcome: Progressing   Problem: Coping: Goal: Ability to adjust to condition or change in health will improve Outcome: Progressing

## 2023-04-03 NOTE — ED Notes (Signed)
Assumed care of patient. Patient resting comfortably in bed with no signs of acute distress noted. Waiting on ready hospital bed. Pt has no complaints at this time.

## 2023-04-04 DIAGNOSIS — R55 Syncope and collapse: Secondary | ICD-10-CM | POA: Diagnosis not present

## 2023-04-04 LAB — LACTIC ACID, PLASMA: Lactic Acid, Venous: 1.9 mmol/L (ref 0.5–1.9)

## 2023-04-04 LAB — BASIC METABOLIC PANEL
Anion gap: 6 (ref 5–15)
BUN: 14 mg/dL (ref 8–23)
CO2: 27 mmol/L (ref 22–32)
Calcium: 8.5 mg/dL — ABNORMAL LOW (ref 8.9–10.3)
Chloride: 102 mmol/L (ref 98–111)
Creatinine, Ser: 1.8 mg/dL — ABNORMAL HIGH (ref 0.61–1.24)
GFR, Estimated: 41 mL/min — ABNORMAL LOW (ref >60)
Glucose, Bld: 180 mg/dL — ABNORMAL HIGH (ref 70–99)
Potassium: 3.7 mmol/L (ref 3.5–5.1)
Sodium: 135 mmol/L (ref 135–145)

## 2023-04-04 LAB — GLUCOSE, CAPILLARY
Glucose-Capillary: 150 mg/dL — ABNORMAL HIGH (ref 70–99)
Glucose-Capillary: 145 mg/dL — ABNORMAL HIGH (ref 70–99)
Glucose-Capillary: 135 mg/dL — ABNORMAL HIGH (ref 70–99)
Glucose-Capillary: 198 mg/dL — ABNORMAL HIGH (ref 70–99)

## 2023-04-04 LAB — HEMOGLOBIN A1C
Hgb A1c MFr Bld: 6.4 % — ABNORMAL HIGH (ref 4.8–5.6)
Mean Plasma Glucose: 136.98 mg/dL

## 2023-04-04 MED ORDER — SODIUM CHLORIDE 0.9 % IV SOLN: INTRAVENOUS | Status: DC

## 2023-04-04 MED ORDER — LACTATED RINGERS IV BOLUS
250.0000 mL | Freq: Once | INTRAVENOUS | Status: AC
  Administered 2023-04-04: 250 mL via INTRAVENOUS

## 2023-04-04 NOTE — Progress Notes (Signed)
PROGRESS NOTE    John Fisher  YQM:[id number] DOB: Jan 18, 1956 DOA: 04/02/2023 PCP: [facility]   Brief Narrative:  This is 67 year old gentleman with history of type 2 diabetes mellitus, alcohol use disorder, orthostatic hypotension recurrent dizziness, recurrent falls and hospitalizations to once again was admitted under hospitalist service after midnight due to fall and syncope and was found to be orthostatic positive, was also found to have mild AKI on CKD stage IIIa.  Received some IV fluids.  Alcohol level was 95.  Admitted for further management of orthostatic hypotension and recurrent dizziness leading to multiple falls.  Assessment & Plan:   Principal Problem:   Syncope Active Problems:   Type 2 diabetes mellitus (HCC)   Alcohol use disorder   Lactic acidosis   AKI (acute kidney injury) (HCC)  Syncope/orthostatic hypotension: Likely related to orthostatic hypotension and alcohol intoxication.  Blood alcohol level was 95.  He had positive orthostatics as well.  Patient told admitting physician that he does not take any antihypertensives but per pharmacy med rec, he is not valsartan 80 mg daily.  Antihypertensives have been on hold and patient has been having persistent orthostatic hypotension.  I will start him on IV fluids.  He needs to be seen by PT OT.  Echo completed 04/03/2023 shows normal ejection fraction.    Lactic acidosis/metabolic acidosis Suspected alcoholic ketoacidosis.  Lactate 4.4 but no infectious signs or symptoms..  Bicarb 14 with normal anion gap and no significant hyperglycemia.  CO2 improved but patient continues to have lactic acidosis, rechecking lactic acid now.  Continue IV fluids.   AKI on CKD stage IIIb: Baseline creatinine appears to be around 1.5-1.6, presented with 2.04 which is improved and currently 1.7 from yesterday, labs from today are pending.    Alcohol use disorder CIWA protocol; Ativan as needed.  Thiamine, folate, and  multivitamin.  He is not having any withdrawal symptoms.  He endorsed that he drinks only 2 drinks of hard liquor every night.   Abnormal chest x-ray finding Chest x-ray showing a stable focal density projecting over the posterior right sixth rib, findings may represent a healed fracture, confirmed by CT chest as well.   ?Hypocalcemia Mild hypocalcemia, likely due to poor nutrition.  No need for replacement.  Repeat labs today.   Type 2 diabetes A1c 5.2 in December 2023, repeat is 6.4 this admission.  Continue SSI.  DVT prophylaxis: SCDs Start: 04/03/23 0641   Code Status: Full Code  Family Communication:  None present at bedside.  Plan of care discussed with patient in length and he/she verbalized understanding and agreed with it.  Status is: Inpatient Remains inpatient appropriate because: Still orthostatic positive and needs to be seen by PT OT.   Estimated body mass index is 25.47 kg/m as calculated from the following:   Height as of this encounter: 6' (1.829 m).   Weight as of this encounter: 85.2 kg.    Nutritional Assessment: Body mass index is 25.47 kg/m.Marland Kitchen Seen by dietician.  I agree with the assessment and plan as outlined below: Nutrition Status:        . Skin Assessment: I have examined the patient's skin and I agree with the wound assessment as performed by the wound care RN as outlined below:    Consultants:  None  Procedures:  None  Antimicrobials:  Anti-infectives (From admission, onward)    None         Subjective: Seen and examined.  Complains of dizziness and generalized  body ache and weakness.  No other complaint.  He further told me that someone from nurses asked him how much he was drinking yesterday.  He felt judged because he was in the hospital all day so obviously he was not drinking.  He further tells me that when he confronted that nursing staff, he was told that the nurse was doing what doctor had asked that particular nurse.  I  clarified with the patient that I had not asked anyone to ask that question from him since I personally had discussed that with him at the time of admission.  I advised him to reach out to the charge nurse or the floor director if he feels that he is being asked inappropriate questions.  Again, there could be some misunderstanding as well.  Objective: Vitals:   04/04/23 0552 04/04/23 0746 04/04/23 0748 04/04/23 0750  BP: (!) 137/93 (!) 155/92 (!) 133/98 (!) 131/97  Pulse: 70 (!) 59 72 (!) 54  Resp: 18 18 16 20   Temp: 98.6 F (37 C) 98.1 F (36.7 C) 98.3 F (36.8 C) 98.9 F (37.2 C)  TempSrc: Oral Oral Oral Oral  SpO2: 100% 100% 100% 91%  Weight:      Height:        Intake/Output Summary (Last 24 hours) at 04/04/2023 1056 Last data filed at 04/04/2023 0800 Gross per 24 hour  Intake 2470 ml  Output 2550 ml  Net -80 ml   Filed Weights   04/03/23 1611  Weight: 85.2 kg    Examination:  General exam: Appears calm and comfortable  Respiratory system: Clear to auscultation. Respiratory effort normal. Cardiovascular system: S1 & S2 heard, RRR. No JVD, murmurs, rubs, gallops or clicks. No pedal edema. Gastrointestinal system: Abdomen is nondistended, soft and nontender. No organomegaly or masses felt. Normal bowel sounds heard. Central nervous system: Alert and oriented. No focal neurological deficits. Extremities: Symmetric 5 x 5 power. Skin: No rashes, lesions or ulcers Psychiatry: Judgement and insight appear normal. Mood & affect appropriate.    Data Reviewed: I have personally reviewed following labs and imaging studies  CBC: Recent Labs  Lab 04/02/23 2140  WBC 8.4  NEUTROABS 4.7  HGB 11.9*  HCT 37.1*  MCV 101.1*  PLT 154   Basic Metabolic Panel: Recent Labs  Lab 04/02/23 2140 04/03/23 0000 04/03/23 0700  NA 135 136 134*  K 4.4 3.7 3.7  CL 107 111 105  CO2 15* 14* 22  GLUCOSE 154* 118* 133*  BUN 31* 25* 24*  CREATININE 2.04* 1.55* 1.71*  CALCIUM 8.0* 7.2*  8.1*   GFR: Estimated Creatinine Clearance: 46 mL/min (A) (by C-G formula based on SCr of 1.71 mg/dL (H)). Liver Function Tests: Recent Labs  Lab 04/02/23 2140 04/03/23 0700  AST 62* 48*  ALT 41 39  ALKPHOS 68 58  BILITOT 1.2 0.9  PROT 6.9 6.3*  ALBUMIN 3.0* 2.7*   No results for input(s): "LIPASE", "AMYLASE" in the last 168 hours. No results for input(s): "AMMONIA" in the last 168 hours. Coagulation Profile: No results for input(s): "INR", "PROTIME" in the last 168 hours. Cardiac Enzymes: No results for input(s): "CKTOTAL", "CKMB", "CKMBINDEX", "TROPONINI" in the last 168 hours. BNP (last 3 results) No results for input(s): "PROBNP" in the last 8760 hours. HbA1C: Recent Labs    04/03/23 0915  HGBA1C 6.4*   CBG: Recent Labs  Lab 04/03/23 0738 04/03/23 1207 04/03/23 1652 04/03/23 2227 04/04/23 0732  GLUCAP 120* 145* 154* 153* 150*   Lipid Profile:  No results for input(s): "CHOL", "HDL", "LDLCALC", "TRIG", "CHOLHDL", "LDLDIRECT" in the last 72 hours. Thyroid Function Tests: No results for input(s): "TSH", "T4TOTAL", "FREET4", "T3FREE", "THYROIDAB" in the last 72 hours. Anemia Panel: No results for input(s): "VITAMINB12", "FOLATE", "FERRITIN", "TIBC", "IRON", "RETICCTPCT" in the last 72 hours. Sepsis Labs: Recent Labs  Lab 04/03/23 0240 04/03/23 1130 04/03/23 1607  LATICACIDVEN 4.4* 2.3* 3.5*    No results found for this or any previous visit (from the past 240 hour(s)).   Radiology Studies: ECHOCARDIOGRAM COMPLETE  Result Date: 04/03/2023    ECHOCARDIOGRAM REPORT   Patient Name:   John Fisher Date of Exam: 04/03/2023 Medical Rec #:  [id number]     Height:       72.0 in Accession #:    [id number]    Weight:       181.0 lb Date of Birth:  1956/09/08     BSA:          2.042 m Patient Age:    89 years      BP:           171/109 mmHg Patient Gender: M             HR:           69 bpm. Exam Location:  Inpatient Procedure: 2D Echo, Cardiac Doppler and Color Doppler  Indications:    Syncope  History:        Patient has prior history of Echocardiogram examinations, most                 recent 08/24/2022. Risk Factors:Hypertension and Diabetes.  Sonographer:    Darlys Gales Referring Phys: [id number] Lynden Flemmer IMPRESSIONS  1. Left ventricular ejection fraction, by estimation, is 60 to 65%. The left ventricle has normal function. The left ventricle has no regional wall motion abnormalities. Left ventricular diastolic parameters were normal.  2. Right ventricular systolic function is normal. The right ventricular size is normal.  3. The mitral valve is normal in structure. Trivial mitral valve regurgitation. No evidence of mitral stenosis.  4. The aortic valve is normal in structure. Aortic valve regurgitation is trivial. No aortic stenosis is present.  5. The inferior vena cava is normal in size with greater than 50% respiratory variability, suggesting right atrial pressure of 3 mmHg. Comparison(s): No significant change from prior study. FINDINGS  Left Ventricle: Left ventricular ejection fraction, by estimation, is 60 to 65%. The left ventricle has normal function. The left ventricle has no regional wall motion abnormalities. The left ventricular internal cavity size was normal in size. There is  no left ventricular hypertrophy. Left ventricular diastolic parameters were normal. Normal left ventricular filling pressure. Right Ventricle: The right ventricular size is normal. No increase in right ventricular wall thickness. Right ventricular systolic function is normal. Left Atrium: Left atrial size was normal in size. Right Atrium: Right atrial size was normal in size. Pericardium: There is no evidence of pericardial effusion. Mitral Valve: The mitral valve is normal in structure. Mild mitral annular calcification. Trivial mitral valve regurgitation. No evidence of mitral valve stenosis. Tricuspid Valve: The tricuspid valve is normal in structure. Tricuspid valve regurgitation is  not demonstrated. No evidence of tricuspid stenosis. Aortic Valve: The aortic valve is normal in structure. Aortic valve regurgitation is trivial. No aortic stenosis is present. Aortic valve mean gradient measures 6.0 mmHg. Aortic valve peak gradient measures 10.1 mmHg. Pulmonic Valve: The pulmonic valve was normal in structure. Pulmonic valve regurgitation is not  visualized. No evidence of pulmonic stenosis. Aorta: The aortic root is normal in size and structure. Venous: The inferior vena cava is normal in size with greater than 50% respiratory variability, suggesting right atrial pressure of 3 mmHg. IAS/Shunts: No atrial level shunt detected by color flow Doppler.  LEFT VENTRICLE PLAX 2D LVIDd:         4.70 cm Diastology LVIDs:         2.90 cm LV e' medial:    7.83 cm/s LV PW:         1.10 cm LV E/e' medial:  10.3 LV IVS:        1.30 cm LV e' lateral:   9.79 cm/s                        LV E/e' lateral: 8.3  RIGHT VENTRICLE RV S prime:     18.20 cm/s TAPSE (M-mode): 2.3 cm LEFT ATRIUM           Index        RIGHT ATRIUM           Index LA Vol (A2C): 45.4 ml 22.23 ml/m  RA Area:     16.90 cm LA Vol (A4C): 35.8 ml 17.53 ml/m  RA Volume:   44.80 ml  21.94 ml/m  AORTIC VALVE AV Vmax:           159.00 cm/s AV Vmean:          113.000 cm/s AV VTI:            0.370 m AV Peak Grad:      10.1 mmHg AV Mean Grad:      6.0 mmHg LVOT Vmax:         123.00 cm/s LVOT Vmean:        89.600 cm/s LVOT VTI:          0.279 m LVOT/AV VTI ratio: 0.75  AORTA Ao Root diam: 2.80 cm Ao Asc diam:  3.70 cm MITRAL VALVE MV Area (PHT): 3.17 cm    SHUNTS MV Decel Time: 239 msec    Systemic VTI: 0.28 m MV E velocity: 81.00 cm/s MV A velocity: 91.00 cm/s MV E/A ratio:  0.89 Mihai Croitoru MD Electronically signed by Thurmon Fair MD Signature Date/Time: 04/03/2023/4:37:06 PM    Final    CT CHEST WO CONTRAST  Result Date: 04/03/2023 CLINICAL DATA:  Abnormal x-ray, lung nodule. EXAM: CT CHEST WITHOUT CONTRAST TECHNIQUE: Multidetector CT  imaging of the chest was performed following the standard protocol without IV contrast. RADIATION DOSE REDUCTION: This exam was performed according to the departmental dose-optimization program which includes automated exposure control, adjustment of the mA and/or kV according to patient size and/or use of iterative reconstruction technique. COMPARISON:  Chest radiograph from yesterday. FINDINGS: Cardiovascular: Normal heart size. No pericardial effusion. Atheromatous calcification of the aorta and coronaries. Mediastinum/Nodes: Negative for mass or adenopathy. Lungs/Pleura: Mild dependent atelectasis. There is no edema, consolidation, effusion, or pneumothorax. No suspicious nodule, a 3 mm subpleural nodule at the right middle lobe is stable since June 2023. No follow-up recommended. Upper Abdomen: Negative. Musculoskeletal: Healing posterior right sixth rib fracture correlates with the chest radiograph findings. The fracture is segmental. Thoracic spondylosis. IMPRESSION: Radiographic abnormality correlates with healing right sixth rib fractures. No suspicious nodule or evidence of active cardiopulmonary disease. Electronically Signed   By: Tiburcio Pea M.D.   On: 04/03/2023 10:41   CT Head Wo Contrast  Result Date: 04/02/2023  CLINICAL DATA:  Trauma. EXAM: CT HEAD WITHOUT CONTRAST CT CERVICAL SPINE WITHOUT CONTRAST TECHNIQUE: Multidetector CT imaging of the head and cervical spine was performed following the standard protocol without intravenous contrast. Multiplanar CT image reconstructions of the cervical spine were also generated. RADIATION DOSE REDUCTION: This exam was performed according to the departmental dose-optimization program which includes automated exposure control, adjustment of the mA and/or kV according to patient size and/or use of iterative reconstruction technique. COMPARISON:  Head CT dated 12/08/2022. FINDINGS: CT HEAD FINDINGS Brain: Mild age-related atrophy and chronic microvascular  ischemic changes. There is no acute intracranial hemorrhage. No mass effect or midline shift. No extra-axial fluid collection. Vascular: No hyperdense vessel or unexpected calcification. Skull: Normal. Negative for fracture or focal lesion. Sinuses/Orbits: No acute finding. Other: Posterior scalp hematoma. CT CERVICAL SPINE FINDINGS Alignment: No acute subluxation. Skull base and vertebrae: No acute fracture. Soft tissues and spinal canal: No prevertebral fluid or swelling. No visible canal hematoma. Disc levels:  No acute findings.  Degenerative changes. Upper chest: Negative. Other: Mild bilateral carotid bulb calcified plaques. IMPRESSION: 1. No acute intracranial pathology. Mild age-related atrophy and chronic microvascular ischemic changes. 2. No acute/traumatic cervical spine pathology. Electronically Signed   By: Elgie Collard M.D.   On: 04/02/2023 23:12   CT Cervical Spine Wo Contrast  Result Date: 04/02/2023 CLINICAL DATA:  Trauma. EXAM: CT HEAD WITHOUT CONTRAST CT CERVICAL SPINE WITHOUT CONTRAST TECHNIQUE: Multidetector CT imaging of the head and cervical spine was performed following the standard protocol without intravenous contrast. Multiplanar CT image reconstructions of the cervical spine were also generated. RADIATION DOSE REDUCTION: This exam was performed according to the departmental dose-optimization program which includes automated exposure control, adjustment of the mA and/or kV according to patient size and/or use of iterative reconstruction technique. COMPARISON:  Head CT dated 12/08/2022. FINDINGS: CT HEAD FINDINGS Brain: Mild age-related atrophy and chronic microvascular ischemic changes. There is no acute intracranial hemorrhage. No mass effect or midline shift. No extra-axial fluid collection. Vascular: No hyperdense vessel or unexpected calcification. Skull: Normal. Negative for fracture or focal lesion. Sinuses/Orbits: No acute finding. Other: Posterior scalp hematoma. CT  CERVICAL SPINE FINDINGS Alignment: No acute subluxation. Skull base and vertebrae: No acute fracture. Soft tissues and spinal canal: No prevertebral fluid or swelling. No visible canal hematoma. Disc levels:  No acute findings.  Degenerative changes. Upper chest: Negative. Other: Mild bilateral carotid bulb calcified plaques. IMPRESSION: 1. No acute intracranial pathology. Mild age-related atrophy and chronic microvascular ischemic changes. 2. No acute/traumatic cervical spine pathology. Electronically Signed   By: Elgie Collard M.D.   On: 04/02/2023 23:12   DG Knee Complete 4 Views Right  Result Date: 04/02/2023 CLINICAL DATA:  Syncope EXAM: RIGHT KNEE - COMPLETE 4+ VIEW COMPARISON:  None Available. FINDINGS: Right knee arthroplasty is present in anatomic alignment. There is no acute fracture or hardware loosening. Soft tissues are within normal limits. IMPRESSION: Right knee arthroplasty in anatomic alignment. Electronically Signed   By: Darliss Cheney M.D.   On: 04/02/2023 22:50   DG Chest 2 View  Result Date: 04/02/2023 CLINICAL DATA:  Syncope EXAM: CHEST - 2 VIEW COMPARISON:  Chest x-ray 03/18/2023.  CT of the chest 02/17/2022. FINDINGS: Vague focal density projects over the posterior right sixth rib, unchanged. The lungs are otherwise clear. No pleural effusion or pneumothorax. Cardiomediastinal silhouette is within normal limits. No acute fractures are seen. IMPRESSION: 1. No acute cardiopulmonary process. 2. Stable focal density projecting over the posterior right sixth rib.  Findings may represent a healed fracture, but other etiologies are not excluded. Follow-up CT of the chest recommended. Electronically Signed   By: Darliss Cheney M.D.   On: 04/02/2023 22:47    Scheduled Meds:  aspirin EC  81 mg Oral Daily   [START ON 04/05/2023] atorvastatin  20 mg Oral Once per day on Monday Thursday   cholecalciferol  1,000 Units Oral Daily   cyanocobalamin  1,000 mcg Oral Daily   folic acid  1 mg Oral  Daily   insulin aspart  0-5 Units Subcutaneous QHS   insulin aspart  0-9 Units Subcutaneous TID WC   multivitamin with minerals  1 tablet Oral Daily   thiamine  100 mg Oral Daily   Or   thiamine  100 mg Intravenous Daily   Continuous Infusions:  sodium chloride 100 mL/hr at 04/04/23 0959     LOS: 1 day   Hughie Closs, MD [facility]  04/04/2023, 10:56 AM   *Please note that this is a verbal dictation therefore any spelling or grammatical errors are due to the "[employer]agon [employer] One" system interpretation.  Please page via Amion and do not message via secure chat for urgent patient care matters. Secure chat can be used for non urgent patient care matters.  How to contact the [facility] Attending or Consulting provider 7A - 7P or covering provider during after hours 7P -7A, for this patient?  Check the care team in [facility] and look for a) attending/consulting [facility] provider listed and b) the [facility] team listed. Page or secure chat 7A-7P. Log into [email or web address] and use [facility] universal password to access. If you do not have the password, please contact the hospital operator. Locate the [facility] provider you are looking for under [facility] and page to a number that you can be directly reached. If you still have difficulty reaching the provider, please page the [facility] (Director on Call) for the Hospitalists listed on amion for assistance.

## 2023-04-04 NOTE — Progress Notes (Signed)
OT Note  Patient Details Name: John Fisher MRN: [id number] DOB: April 09, 1956   Cancelled Treatment:    Reason Eval/Treat Not Completed: OT screened, no needs identified, will sign off  Jaymond Waage, [place] 04/04/2023, 1:25 PM  Lise Auer, OT Acute Rehabilitation Services  [phone]

## 2023-04-04 NOTE — TOC Initial Note (Signed)
Transition of Care [facility]) - Initial/Assessment Note    Patient Details  Name: John Fisher MRN: [id number] Date of Birth: 1956-01-10  Transition of Care [facility]) CM/SW Contact:    Otelia Santee, LCSW Phone Number: 04/04/2023, 12:38 PM  Clinical Narrative:                 Met with pt at bedside to discuss resources for EtOH use. Pt states he is "tired of people coming in here asking about how much I drink." Pt shares he is not an "alcoholic" and has never been one. Pt denies alcohol use being an issue. Pt states he fell due to not eating and denies alcohol playing a role in fall. Pt declines for resources to be placed on AVS.  CSW encouraged pt to reach out to CSW should he change his mind.   Expected Discharge Plan: Home/Self Care Barriers to Discharge: No Barriers Identified   Patient Goals and CMS Choice Patient states their goals for this hospitalization and ongoing recovery are:: To go to rehab CMS Medicare.gov Compare Post Acute Care list provided to:: Patient Choice offered to / list presented to : Patient [place] ownership interest in [facility].provided to:: Patient    Expected Discharge Plan and Services In-house Referral: Clinical Social Work Discharge Planning Services: NA Post Acute Care Choice: Skilled Nursing Facility Living arrangements for the past 2 months: Apartment                 DME Arranged: N/A DME Agency: NA                  Prior Living Arrangements/Services Living arrangements for the past 2 months: Apartment Lives with:: Self Patient language and need for interpreter reviewed:: Yes Do you feel safe going back to the place where you live?: Yes      Need for Family Participation in Patient Care: No (Comment) Care giver support system in place?: No (comment)   Criminal Activity/Legal Involvement Pertinent to Current Situation/Hospitalization: No - Comment as needed  Activities of Daily Living Home Assistive Devices/Equipment:  CBG Meter, Cane (specify quad or straight) ADL Screening (condition at time of admission) Patient's cognitive ability adequate to safely complete daily activities?: Yes Is the patient deaf or have difficulty hearing?: No Does the patient have difficulty seeing, even when wearing glasses/contacts?: No Does the patient have difficulty concentrating, remembering, or making decisions?: No Patient able to express need for assistance with ADLs?: Yes Does the patient have difficulty dressing or bathing?: No Independently performs ADLs?: Yes (appropriate for developmental age) Does the patient have difficulty walking or climbing stairs?: Yes Weakness of Legs: Both Weakness of Arms/Hands: None  Permission Sought/Granted Permission sought to share information with : Family Supports, Oceanographer granted to share information with : Yes, Verbal Permission Granted  Share Information with NAME: Marqui Formby  Permission granted to share info w [facility]  Permission granted to share info w Relationship: Daughter  Permission granted to share info w Contact Information: [phone]  Emotional Assessment Appearance:: Appears stated age Attitude/Demeanor/Rapport: Engaged Affect (typically observed): Defensive Orientation: : Oriented to Self, Oriented to Place, Oriented to  Time, Oriented to Situation Alcohol / Substance Use: Alcohol Use Psych Involvement: No (comment)  Admission diagnosis:  Lactic acidosis [E87.20] Syncope [R55] Metabolic acidosis [E87.20] Syncope, unspecified syncope type [R55] Patient Active Problem List   Diagnosis Date Noted   Lactic acidosis 04/03/2023   AKI (acute kidney injury) (HCC) 04/03/2023  Dizziness 12/08/2022   Fall 12/04/2022   Hypoglycemia 12/04/2022   Alcohol withdrawal (HCC) 12/03/2022   Alcoholic ketoacidosis 12/03/2022   Orthostatic syncope 08/27/2022   Encounter for dental examination 08/27/2022   Caries 08/27/2022    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   Accretions on teeth 08/27/2022   Teeth missing 08/27/2022   Weakness 08/26/2022   Malnutrition of moderate degree 08/25/2022   Poor dentition 08/25/2022   Alcohol use disorder 08/24/2022   Diabetes mellitus (HCC) 08/24/2022   Dysphagia 08/24/2022   Perennial and seasonal allergic rhinitis 12/25/2019   BPH (benign prostatic hyperplasia) 08/07/2015   Syncope 08/07/2015   Syncope and collapse 08/07/2015   Dyslipidemia 05/18/2013   Shoulder pain, right 02/10/2011   Hypertension 02/10/2011   Insomnia 02/10/2011   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 [place] [address] Phone: [phone] Fax: [phone]     Social Determinants of Health (SDOH) Social History: SDOH Screenings   Food Insecurity: No Food Insecurity (04/03/2023)  Housing: Low Risk  (04/03/2023)  Transportation Needs: No Transportation Needs (04/03/2023)  Utilities: Not At Risk (04/03/2023)  Social Connections: Unknown (03/11/2022)   Received from [facility]  Tobacco Use: Low Risk  (04/02/2023)   SDOH Interventions:     Readmission Risk Interventions    04/04/2023   12:36 PM  Readmission Risk Prevention Plan  Transportation Screening Complete  PCP or Specialist Appt within 5-7 Days Complete  Home Care Screening Complete  Medication Review (RN CM) Complete

## 2023-04-04 NOTE — Evaluation (Signed)
Physical Therapy One Time Evaluation Patient Details Name: John Fisher MRN: [id number] DOB: 05-31-1956 Today's Date: 04/04/2023  History of Present Illness  67 year old gentleman admitted under hospitalist service due to fall and syncope and was found to be orthostatic positive, was also found to have mild AKI on CKD stage IIIa. Past medical history of type 2 diabetes mellitus, alcohol use disorder, orthostatic hypotension, recurrent dizziness, recurrent falls and hospitalizations  Clinical Impression  Patient evaluated by Physical Therapy with no further acute PT needs identified. All education has been completed and the patient has no further questions.  Pt ambulated in hallway and declined use of assistive device.  Pt has mildly antalgic gait due to right knee pain from fall prior to admission and was encouraged to use assistive device as needed for pain control upon d/c since he reports having a cane and RW.  Pt left in sitting in alarmed recliner in front of sink to shave his face per his request (supplies already in room). PT is signing off. Thank you for this referral.         Assistance Recommended at Discharge PRN  If plan is discharge home, recommend the following:  Can travel by private vehicle           Equipment Recommendations None recommended by PT  Recommendations for Other Services       Functional Status Assessment Patient has had a recent decline in their functional status and demonstrates the ability to make significant improvements in function in a reasonable and predictable amount of time.     Precautions / Restrictions Precautions Precautions: Fall      Mobility  Bed Mobility Overal bed mobility: Independent                  Transfers Overall transfer level: Modified independent                      Ambulation/Gait Ambulation/Gait assistance: Supervision, Min guard Gait Distance (Feet): 160 Feet Assistive device: IV Pole Gait  Pattern/deviations: Step-through pattern, Decreased stance time - right, Antalgic       General Gait Details: mildly antalgic gait due to reported right knee pain from fall prior to arrival, pt pushed IV pole, declined need for RW (he does have cane and RW at home and encouraged him to use assistive device if needed for pain control upon d/c)  Stairs            Wheelchair Mobility     Tilt Bed    Modified Rankin (Stroke Patients Only)       Balance Overall balance assessment: History of Falls                                           Pertinent Vitals/Pain Pain Assessment Pain Assessment: 0-10 Pain Score: 5  Pain Location: "all over" upper body and head from fall, also right knee Pain Descriptors / Indicators: Sore Pain Intervention(s): Repositioned, Monitored during session    Home Living Family/patient expects to be discharged to:: Private residence Living Arrangements: Alone   Type of Home: Apartment Home Access: Level entry       Home Layout: One level Home Equipment: Cane - single Librarian, academic (2 wheels)      Prior Function Prior Level of Function : Independent/Modified Independent;Driving  Hand Dominance        Extremity/Trunk Assessment   Upper Extremity Assessment Upper Extremity Assessment: Overall WFL for tasks assessed    Lower Extremity Assessment Lower Extremity Assessment: Overall WFL for tasks assessed    Cervical / Trunk Assessment Cervical / Trunk Assessment: Normal  Communication   Communication: No difficulties  Cognition Arousal/Alertness: Awake/alert Behavior During Therapy: WFL for tasks assessed/performed Overall Cognitive Status: Within Functional Limits for tasks assessed                                          General Comments      Exercises     Assessment/Plan    PT Assessment Patient does not need any further PT services  PT Problem  List         PT Treatment Interventions      PT Goals (Current goals can be found in the Care Plan section)  Acute Rehab PT Goals PT Goal Formulation: All assessment and education complete, DC therapy    Frequency       Co-evaluation               AM-PAC PT "6 Clicks" Mobility  Outcome Measure Help needed turning from your back to your side while in a flat bed without using bedrails?: None Help needed moving from lying on your back to sitting on the side of a flat bed without using bedrails?: None Help needed moving to and from a bed to a chair (including a wheelchair)?: None Help needed standing up from a chair using your arms (e.g., wheelchair or bedside chair)?: None Help needed to walk in hospital room?: A Little Help needed climbing 3-5 steps with a railing? : A Little 6 Click Score: 22    End of Session Equipment Utilized During Treatment: Gait belt Activity Tolerance: Patient tolerated treatment well Patient left: in chair;with call bell/phone within reach;with chair alarm set RN and NT aware pt sitting at sink to shave, alarm activated, call bell within reach Nurse Communication: Mobility status PT Visit Diagnosis: Difficulty in walking, not elsewhere classified (R26.2)    Time: 1001-1014 PT Time Calculation (min) (ACUTE ONLY): 13 min   Charges:   PT Evaluation $PT Eval Low Complexity: 1 Low   PT General Charges $$ ACUTE PT VISIT: 1 Visit       Thomasene Mohair PT, DPT Physical Therapist Acute Rehabilitation Services Office: [phone]   Janan Halter [place] 04/04/2023, 12:35 PM

## 2023-04-05 DIAGNOSIS — R55 Syncope and collapse: Secondary | ICD-10-CM | POA: Diagnosis not present

## 2023-04-05 LAB — BASIC METABOLIC PANEL
Anion gap: 7 (ref 5–15)
BUN: 13 mg/dL (ref 8–23)
CO2: 23 mmol/L (ref 22–32)
Calcium: 8.1 mg/dL — ABNORMAL LOW (ref 8.9–10.3)
Chloride: 109 mmol/L (ref 98–111)
Creatinine, Ser: 1.61 mg/dL — ABNORMAL HIGH (ref 0.61–1.24)
GFR, Estimated: 47 mL/min — ABNORMAL LOW (ref >60)
Glucose, Bld: 135 mg/dL — ABNORMAL HIGH (ref 70–99)
Potassium: 3.5 mmol/L (ref 3.5–5.1)
Sodium: 139 mmol/L (ref 135–145)

## 2023-04-05 LAB — GLUCOSE, CAPILLARY
Glucose-Capillary: 124 mg/dL — ABNORMAL HIGH (ref 70–99)
Glucose-Capillary: 162 mg/dL — ABNORMAL HIGH (ref 70–99)

## 2023-04-05 NOTE — Discharge Summary (Signed)
Physician Discharge Summary  John Fisher NWG:[id number] DOB: 1956/08/22 DOA: 04/02/2023  PCP: [facility]  Admit date: 04/02/2023 Discharge date: 04/05/2023 30 Day Unplanned Readmission Risk Score    Flowsheet Row ED to Hosp-Admission (Current) from 04/02/2023 in [facility]  30 Day Unplanned Readmission Risk Score (%) 17.86 Filed at 04/05/2023 0801       This score is the patient's risk of an unplanned readmission within 30 days of being discharged (0 -100%). The score is based on dignosis, age, lab data, medications, orders, and past utilization.   Low:  0-14.9   Medium: 15-21.9   High: 22-29.9   Extreme: 30 and above          Admitted From: Home Disposition: Home  Recommendations for Outpatient Follow-up:  Follow up with PCP in 1-2 weeks Please obtain BMP/CBC in one week Please follow up with your PCP on the following pending results: Unresulted Labs (From admission, onward)    None         Home Health: None Equipment/Devices: None  Discharge Condition: Stable CODE STATUS: Full code Diet recommendation: Diabetic and cardiac  Subjective: Seen and.  Feels well.  No complaints.  He is agreeable with the discharge plan.  No more dizziness.  Brief/Interim Summary: This is 67 year old gentleman with history of type 2 diabetes mellitus, alcohol use disorder, orthostatic hypotension recurrent dizziness, recurrent falls and hospitalizations to once again was admitted under hospitalist service after  due to fall and syncope and was found to be orthostatic positive, was also found to have mild AKI on CKD stage IIIb.  Received some IV fluids.  Alcohol level was 95.  Admitted for further management of orthostatic hypotension and recurrent dizziness leading to multiple falls.  We checked his orthostatics, he was positive initially but since last 24 hours, his orthostatics have been fine and his dizziness has resolved.  He was assessed  by PT OT and they did not recommend SNF or home health PT.  Patient received fluids.  Due to his orthostatic hypotension, we are discontinuing his valsartan at discharge.  Patient is in agreement with the discharge plan.  Echo completed 04/03/2023 shows normal ejection fraction.    Lactic acidosis/metabolic acidosis Suspected alcoholic ketoacidosis.  Lactate 4.4 but no infectious signs or symptoms..  Bicarb 14 with normal anion gap and no significant hyperglycemia.  CO2 and lactic acidosis improved.   AKI on CKD stage IIIb: Baseline creatinine appears to be around 1.5-1.6, presented with 2.04 which is improved and back to baseline.   Alcohol use disorder He reports that he drinks only 2 drinks of hard liquor every day.  Patient did not have any withdrawal symptoms during the hospitalization.   Abnormal chest x-ray finding Chest x-ray showing a stable focal density projecting over the posterior right sixth rib, findings may represent a healed fracture, confirmed by CT chest as well.   ?Hypocalcemia Mild hypocalcemia, likely due to poor nutrition.  No need for replacement.  Encouraged better nutrition.   Type 2 diabetes A1c 5.2 in December 2023, repeat is 6.4 this admission.  Resume home medications.    Discharge plan was discussed with patient and/or family member and they verbalized understanding and agreed with it.  Discharge Diagnoses:  Principal Problem:   Syncope Active Problems:   Type 2 diabetes mellitus (HCC)   Alcohol use disorder   Orthostatic hypotension   Lactic acidosis   Acute renal failure superimposed on stage 3b chronic kidney  disease [facility])    Discharge Instructions   Allergies as of 04/05/2023       Reactions   Trospium Other (See Comments)   Retention of urine   Alfuzosin Anxiety, Other (See Comments)   Increased frequency of urination, also        Medication List     STOP taking these medications    valsartan 160 MG tablet Commonly known as:  DIOVAN       TAKE these medications    aspirin EC 81 MG tablet Take 81 mg by mouth daily.   atorvastatin 20 MG tablet Commonly known as: LIPITOR Take 20 mg by mouth 2 (two) times a week.   cholecalciferol 25 MCG (1000 UNIT) tablet Commonly known as: VITAMIN D3 Take 1,000 Units by mouth daily.   cyanocobalamin 1000 MCG tablet Commonly known as: VITAMIN B12 Take 1,000 mcg by mouth daily.   DULoxetine 60 MG capsule Commonly known as: CYMBALTA Take 60 mg by mouth daily.   ENSURE PLUS PO Take 1 Can by mouth in the morning and at bedtime.   fluticasone 50 MCG/ACT nasal spray Commonly known as: FLONASE Place 1 spray into both nostrils 2 (two) times daily as needed for allergies or rhinitis. What changed:  how much to take when to take this   metFORMIN 500 MG tablet Commonly known as: GLUCOPHAGE Take 500 mg by mouth daily with breakfast.   naloxone 4 MG/0.1ML Liqd nasal spray kit Commonly known as: NARCAN Place 1 spray into the nose once as needed (for accidental overdose).   Voltaren 1 % Gel Generic drug: diclofenac Sodium Apply 2 g topically 4 (four) times daily as needed (for pain- affected areas).        Follow-up Information     [facility] in 1 week(s).   Contact information: [address] [facility] [place] [address] [phone]                Allergies  Allergen Reactions   Trospium Other (See Comments)    Retention of urine   Alfuzosin Anxiety and Other (See Comments)    Increased frequency of urination, also    Consultations: None   Procedures/Studies: ECHOCARDIOGRAM COMPLETE  Result Date: 04/03/2023    ECHOCARDIOGRAM REPORT   Patient Name:   John Fisher Date of Exam: 04/03/2023 Medical Rec #:  [id number]     Height:       72.0 in Accession #:    [id number]    Weight:       181.0 lb Date of Birth:  1956-08-12     BSA:          2.042 m Patient Age:    67 years      BP:           171/109 mmHg Patient  Gender: M             HR:           69 bpm. Exam Location:  Inpatient Procedure: 2D Echo, Cardiac Doppler and Color Doppler Indications:    Syncope  History:        Patient has prior history of Echocardiogram examinations, most                 recent 08/24/2022. Risk Factors:Hypertension and Diabetes.  Sonographer:    Darlys Gales Referring Phys: [id number] Kamelia Lampkins IMPRESSIONS  1. Left ventricular ejection fraction, by estimation, is 60 to 65%. The left ventricle has normal function.  The left ventricle has no regional wall motion abnormalities. Left ventricular diastolic parameters were normal.  2. Right ventricular systolic function is normal. The right ventricular size is normal.  3. The mitral valve is normal in structure. Trivial mitral valve regurgitation. No evidence of mitral stenosis.  4. The aortic valve is normal in structure. Aortic valve regurgitation is trivial. No aortic stenosis is present.  5. The inferior vena cava is normal in size with greater than 50% respiratory variability, suggesting right atrial pressure of 3 mmHg. Comparison(s): No significant change from prior study. FINDINGS  Left Ventricle: Left ventricular ejection fraction, by estimation, is 60 to 65%. The left ventricle has normal function. The left ventricle has no regional wall motion abnormalities. The left ventricular internal cavity size was normal in size. There is  no left ventricular hypertrophy. Left ventricular diastolic parameters were normal. Normal left ventricular filling pressure. Right Ventricle: The right ventricular size is normal. No increase in right ventricular wall thickness. Right ventricular systolic function is normal. Left Atrium: Left atrial size was normal in size. Right Atrium: Right atrial size was normal in size. Pericardium: There is no evidence of pericardial effusion. Mitral Valve: The mitral valve is normal in structure. Mild mitral annular calcification. Trivial mitral valve regurgitation. No  evidence of mitral valve stenosis. Tricuspid Valve: The tricuspid valve is normal in structure. Tricuspid valve regurgitation is not demonstrated. No evidence of tricuspid stenosis. Aortic Valve: The aortic valve is normal in structure. Aortic valve regurgitation is trivial. No aortic stenosis is present. Aortic valve mean gradient measures 6.0 mmHg. Aortic valve peak gradient measures 10.1 mmHg. Pulmonic Valve: The pulmonic valve was normal in structure. Pulmonic valve regurgitation is not visualized. No evidence of pulmonic stenosis. Aorta: The aortic root is normal in size and structure. Venous: The inferior vena cava is normal in size with greater than 50% respiratory variability, suggesting right atrial pressure of 3 mmHg. IAS/Shunts: No atrial level shunt detected by color flow Doppler.  LEFT VENTRICLE PLAX 2D LVIDd:         4.70 cm Diastology LVIDs:         2.90 cm LV e' medial:    7.83 cm/s LV PW:         1.10 cm LV E/e' medial:  10.3 LV IVS:        1.30 cm LV e' lateral:   9.79 cm/s                        LV E/e' lateral: 8.3  RIGHT VENTRICLE RV S prime:     18.20 cm/s TAPSE (M-mode): 2.3 cm LEFT ATRIUM           Index        RIGHT ATRIUM           Index LA Vol (A2C): 45.4 ml 22.23 ml/m  RA Area:     16.90 cm LA Vol (A4C): 35.8 ml 17.53 ml/m  RA Volume:   44.80 ml  21.94 ml/m  AORTIC VALVE AV Vmax:           159.00 cm/s AV Vmean:          113.000 cm/s AV VTI:            0.370 m AV Peak Grad:      10.1 mmHg AV Mean Grad:      6.0 mmHg LVOT Vmax:         123.00 cm/s LVOT Vmean:  89.600 cm/s LVOT VTI:          0.279 m LVOT/AV VTI ratio: 0.75  AORTA Ao Root diam: 2.80 cm Ao Asc diam:  3.70 cm MITRAL VALVE MV Area (PHT): 3.17 cm    SHUNTS MV Decel Time: 239 msec    Systemic VTI: 0.28 m MV E velocity: 81.00 cm/s MV A velocity: 91.00 cm/s MV E/A ratio:  0.89 Mihai Croitoru MD Electronically signed by Thurmon Fair MD Signature Date/Time: 04/03/2023/4:37:06 PM    Final    CT CHEST WO CONTRAST  Result  Date: 04/03/2023 CLINICAL DATA:  Abnormal x-ray, lung nodule. EXAM: CT CHEST WITHOUT CONTRAST TECHNIQUE: Multidetector CT imaging of the chest was performed following the standard protocol without IV contrast. RADIATION DOSE REDUCTION: This exam was performed according to the departmental dose-optimization program which includes automated exposure control, adjustment of the mA and/or kV according to patient size and/or use of iterative reconstruction technique. COMPARISON:  Chest radiograph from yesterday. FINDINGS: Cardiovascular: Normal heart size. No pericardial effusion. Atheromatous calcification of the aorta and coronaries. Mediastinum/Nodes: Negative for mass or adenopathy. Lungs/Pleura: Mild dependent atelectasis. There is no edema, consolidation, effusion, or pneumothorax. No suspicious nodule, a 3 mm subpleural nodule at the right middle lobe is stable since June 2023. No follow-up recommended. Upper Abdomen: Negative. Musculoskeletal: Healing posterior right sixth rib fracture correlates with the chest radiograph findings. The fracture is segmental. Thoracic spondylosis. IMPRESSION: Radiographic abnormality correlates with healing right sixth rib fractures. No suspicious nodule or evidence of active cardiopulmonary disease. Electronically Signed   By: Tiburcio Pea M.D.   On: 04/03/2023 10:41   CT Head Wo Contrast  Result Date: 04/02/2023 CLINICAL DATA:  Trauma. EXAM: CT HEAD WITHOUT CONTRAST CT CERVICAL SPINE WITHOUT CONTRAST TECHNIQUE: Multidetector CT imaging of the head and cervical spine was performed following the standard protocol without intravenous contrast. Multiplanar CT image reconstructions of the cervical spine were also generated. RADIATION DOSE REDUCTION: This exam was performed according to the departmental dose-optimization program which includes automated exposure control, adjustment of the mA and/or kV according to patient size and/or use of iterative reconstruction technique.  COMPARISON:  Head CT dated 12/08/2022. FINDINGS: CT HEAD FINDINGS Brain: Mild age-related atrophy and chronic microvascular ischemic changes. There is no acute intracranial hemorrhage. No mass effect or midline shift. No extra-axial fluid collection. Vascular: No hyperdense vessel or unexpected calcification. Skull: Normal. Negative for fracture or focal lesion. Sinuses/Orbits: No acute finding. Other: Posterior scalp hematoma. CT CERVICAL SPINE FINDINGS Alignment: No acute subluxation. Skull base and vertebrae: No acute fracture. Soft tissues and spinal canal: No prevertebral fluid or swelling. No visible canal hematoma. Disc levels:  No acute findings.  Degenerative changes. Upper chest: Negative. Other: Mild bilateral carotid bulb calcified plaques. IMPRESSION: 1. No acute intracranial pathology. Mild age-related atrophy and chronic microvascular ischemic changes. 2. No acute/traumatic cervical spine pathology. Electronically Signed   By: Elgie Collard M.D.   On: 04/02/2023 23:12   CT Cervical Spine Wo Contrast  Result Date: 04/02/2023 CLINICAL DATA:  Trauma. EXAM: CT HEAD WITHOUT CONTRAST CT CERVICAL SPINE WITHOUT CONTRAST TECHNIQUE: Multidetector CT imaging of the head and cervical spine was performed following the standard protocol without intravenous contrast. Multiplanar CT image reconstructions of the cervical spine were also generated. RADIATION DOSE REDUCTION: This exam was performed according to the departmental dose-optimization program which includes automated exposure control, adjustment of the mA and/or kV according to patient size and/or use of iterative reconstruction technique. COMPARISON:  Head CT dated 12/08/2022.  FINDINGS: CT HEAD FINDINGS Brain: Mild age-related atrophy and chronic microvascular ischemic changes. There is no acute intracranial hemorrhage. No mass effect or midline shift. No extra-axial fluid collection. Vascular: No hyperdense vessel or unexpected calcification. Skull:  Normal. Negative for fracture or focal lesion. Sinuses/Orbits: No acute finding. Other: Posterior scalp hematoma. CT CERVICAL SPINE FINDINGS Alignment: No acute subluxation. Skull base and vertebrae: No acute fracture. Soft tissues and spinal canal: No prevertebral fluid or swelling. No visible canal hematoma. Disc levels:  No acute findings.  Degenerative changes. Upper chest: Negative. Other: Mild bilateral carotid bulb calcified plaques. IMPRESSION: 1. No acute intracranial pathology. Mild age-related atrophy and chronic microvascular ischemic changes. 2. No acute/traumatic cervical spine pathology. Electronically Signed   By: Elgie Collard M.D.   On: 04/02/2023 23:12   DG Knee Complete 4 Views Right  Result Date: 04/02/2023 CLINICAL DATA:  Syncope EXAM: RIGHT KNEE - COMPLETE 4+ VIEW COMPARISON:  None Available. FINDINGS: Right knee arthroplasty is present in anatomic alignment. There is no acute fracture or hardware loosening. Soft tissues are within normal limits. IMPRESSION: Right knee arthroplasty in anatomic alignment. Electronically Signed   By: Darliss Cheney M.D.   On: 04/02/2023 22:50   DG Chest 2 View  Result Date: 04/02/2023 CLINICAL DATA:  Syncope EXAM: CHEST - 2 VIEW COMPARISON:  Chest x-ray 03/18/2023.  CT of the chest 02/17/2022. FINDINGS: Vague focal density projects over the posterior right sixth rib, unchanged. The lungs are otherwise clear. No pleural effusion or pneumothorax. Cardiomediastinal silhouette is within normal limits. No acute fractures are seen. IMPRESSION: 1. No acute cardiopulmonary process. 2. Stable focal density projecting over the posterior right sixth rib. Findings may represent a healed fracture, but other etiologies are not excluded. Follow-up CT of the chest recommended. Electronically Signed   By: Darliss Cheney M.D.   On: 04/02/2023 22:47   DG Chest 2 View  Result Date: 03/18/2023 CLINICAL DATA:  Dizziness. EXAM: CHEST - 2 VIEW COMPARISON:  08/24/2022  FINDINGS: The lungs are clear without focal pneumonia, edema, pneumothorax or pleural effusion. Nodular density over the lateral right mid lung may be related to bony callus from old anterior right fourth rib fracture. The cardiopericardial silhouette is within normal limits for size. No acute bony abnormality. IMPRESSION: 1. No acute cardiopulmonary findings. 2. Nodular density over the lateral right mid lung may be related to bony callus from old anterior right fourth rib fracture. CT chest without contrast recommended to confirm that this is related to an old rib fracture and not a superimposed pulmonary nodule. Electronically Signed   By: Kennith Center M.D.   On: 03/18/2023 17:32     Discharge Exam: Vitals:   04/05/23 0046 04/05/23 0622  BP: (!) 158/103 (!) 140/104  Pulse: 60 (!) 58  Resp: 17 17  Temp: 98.6 F (37 C) 98.5 F (36.9 C)  SpO2: 98% 98%   Vitals:   04/04/23 2027 04/04/23 2029 04/05/23 0046 04/05/23 0622  BP: (!) 154/85 (!) 154/85 (!) 158/103 (!) 140/104  Pulse: 80 80 60 (!) 58  Resp: 18 18 17 17   Temp:  99 F (37.2 C) 98.6 F (37 C) 98.5 F (36.9 C)  TempSrc:  Oral Oral Oral  SpO2: 100%  98% 98%  Weight:      Height:        General: Pt is alert, awake, not in acute distress Cardiovascular: RRR, S1/S2 +, no rubs, no gallops Respiratory: CTA bilaterally, no wheezing, no rhonchi Abdominal: Soft, NT, ND,  bowel sounds + Extremities: no edema, no cyanosis    The results of significant diagnostics from this hospitalization (including imaging, microbiology, ancillary and laboratory) are listed below for reference.     Microbiology: No results found for this or any previous visit (from the past 240 hour(s)).   Labs: BNP (last 3 results) No results for input(s): "BNP" in the last 8760 hours. Basic Metabolic Panel: Recent Labs  Lab 04/02/23 2140 04/03/23 0000 04/03/23 0700 04/04/23 1043 04/05/23 0713  NA 135 136 134* 135 139  K 4.4 3.7 3.7 3.7 3.5  CL 107  111 105 102 109  CO2 15* 14* 22 27 23   GLUCOSE 154* 118* 133* 180* 135*  BUN 31* 25* 24* 14 13  CREATININE 2.04* 1.55* 1.71* 1.80* 1.61*  CALCIUM 8.0* 7.2* 8.1* 8.5* 8.1*   Liver Function Tests: Recent Labs  Lab 04/02/23 2140 04/03/23 0700  AST 62* 48*  ALT 41 39  ALKPHOS 68 58  BILITOT 1.2 0.9  PROT 6.9 6.3*  ALBUMIN 3.0* 2.7*   No results for input(s): "LIPASE", "AMYLASE" in the last 168 hours. No results for input(s): "AMMONIA" in the last 168 hours. CBC: Recent Labs  Lab 04/02/23 2140  WBC 8.4  NEUTROABS 4.7  HGB 11.9*  HCT 37.1*  MCV 101.1*  PLT 154   Cardiac Enzymes: No results for input(s): "CKTOTAL", "CKMB", "CKMBINDEX", "TROPONINI" in the last 168 hours. BNP: Invalid input(s): "POCBNP" CBG: Recent Labs  Lab 04/04/23 0732 04/04/23 1133 04/04/23 1642 04/04/23 2021 04/05/23 0746  GLUCAP 150* 145* 135* 198* 124*   D-Dimer No results for input(s): "DDIMER" in the last 72 hours. Hgb A1c Recent Labs    04/03/23 0915  HGBA1C 6.4*   Lipid Profile No results for input(s): "CHOL", "HDL", "LDLCALC", "TRIG", "CHOLHDL", "LDLDIRECT" in the last 72 hours. Thyroid function studies No results for input(s): "TSH", "T4TOTAL", "T3FREE", "THYROIDAB" in the last 72 hours.  Invalid input(s): "FREET3" Anemia work up No results for input(s): "VITAMINB12", "FOLATE", "FERRITIN", "TIBC", "IRON", "RETICCTPCT" in the last 72 hours. Urinalysis    Component Value Date/Time   COLORURINE STRAW (A) 04/03/2023 0727   APPEARANCEUR CLEAR 04/03/2023 0727   LABSPEC 1.011 04/03/2023 0727   PHURINE 6.0 04/03/2023 0727   GLUCOSEU 50 (A) 04/03/2023 0727   GLUCOSEU NEGATIVE 02/15/2013 1635   HGBUR NEGATIVE 04/03/2023 0727   BILIRUBINUR NEGATIVE 04/03/2023 0727   KETONESUR NEGATIVE 04/03/2023 0727   PROTEINUR NEGATIVE 04/03/2023 0727   UROBILINOGEN 0.2 02/15/2013 1635   NITRITE NEGATIVE 04/03/2023 0727   LEUKOCYTESUR NEGATIVE 04/03/2023 0727   Sepsis Labs Recent Labs  Lab  04/02/23 2140  WBC 8.4   Microbiology No results found for this or any previous visit (from the past 240 hour(s)).  FURTHER DISCHARGE INSTRUCTIONS:   Get Medicines reviewed and adjusted: Please take all your medications with you for your next visit with your Primary MD   Laboratory/radiological data: Please request your Primary MD to go over all hospital tests and procedure/radiological results at the follow up, please ask your Primary MD to get all Hospital records sent to his/her office.   In some cases, they will be blood work, cultures and biopsy results pending at the time of your discharge. Please request that your primary care M.D. goes through all the records of your hospital data and follows up on these results.   Also Note the following: If you experience worsening of your admission symptoms, develop shortness of breath, life threatening emergency, suicidal or homicidal thoughts you must  seek medical attention immediately by calling 911 or calling your MD immediately  if symptoms less severe.   You must read complete instructions/literature along with all the possible adverse reactions/side effects for all the Medicines you take and that have been prescribed to you. Take any new Medicines after you have completely understood and accpet all the possible adverse reactions/side effects.    Do not drive when taking Pain medications or sleeping medications (Benzodaizepines)   Do not take more than prescribed Pain, Sleep and Anxiety Medications. It is not advisable to combine anxiety,sleep and pain medications without talking with your primary care practitioner   Special Instructions: If you have smoked or chewed Tobacco  in the last 2 yrs please stop smoking, stop any regular Alcohol  and or any Recreational drug use.   Wear Seat belts while driving.   Please note: You were cared for by a hospitalist during your hospital stay. Once you are discharged, your primary care physician  will handle any further medical issues. Please note that NO REFILLS for any discharge medications will be authorized once you are discharged, as it is imperative that you return to your primary care physician (or establish a relationship with a primary care physician if you do not have one) for your post hospital discharge needs so that they can reassess your need for medications and monitor your lab values  Time coordinating discharge: Over 30 minutes  SIGNED:   Hughie Closs, MD  [facility] 04/05/2023, 9:51 AM *Please note that this is a verbal dictation therefore any spelling or grammatical errors are due to the "[employer] One" system interpretation. If 7PM-7AM, please contact night-coverage [email or web address]

## 2023-04-05 NOTE — Progress Notes (Signed)
Patient provided with discharge education, patient verbalized understanding.  

## 2023-04-05 NOTE — Progress Notes (Signed)
Mobility Specialist Cancellation/Refusal Note:   Reason for Refusal: Pt stated "When am I going to get some sleep".  Offered to let pt sleep this morning & will attempt ambulation this afternoon if schedule permits.   [facility]

## 2023-05-09 ENCOUNTER — Inpatient Hospital Stay (HOSPITAL_COMMUNITY): Payer: No Typology Code available for payment source

## 2023-05-09 ENCOUNTER — Other Ambulatory Visit: Payer: Self-pay

## 2023-05-09 ENCOUNTER — Inpatient Hospital Stay (HOSPITAL_COMMUNITY)
Admission: EM | Admit: 2023-05-09 | Discharge: 2023-05-12 | DRG: 683 | Disposition: A | Discharge status: Home Health | Payer: No Typology Code available for payment source | Attending: Internal Medicine | Admitting: Internal Medicine

## 2023-05-09 ENCOUNTER — Encounter (HOSPITAL_COMMUNITY): Payer: Self-pay | Admitting: Emergency Medicine

## 2023-05-09 DIAGNOSIS — E785 Hyperlipidemia, unspecified: Secondary | ICD-10-CM | POA: Diagnosis present

## 2023-05-09 DIAGNOSIS — E1122 Type 2 diabetes mellitus with diabetic chronic kidney disease: Secondary | ICD-10-CM | POA: Diagnosis present

## 2023-05-09 DIAGNOSIS — I951 Orthostatic hypotension: Secondary | ICD-10-CM | POA: Diagnosis present

## 2023-05-09 DIAGNOSIS — F1090 Alcohol use, unspecified, uncomplicated: Secondary | ICD-10-CM | POA: Diagnosis present

## 2023-05-09 DIAGNOSIS — K029 Dental caries, unspecified: Secondary | ICD-10-CM | POA: Diagnosis present

## 2023-05-09 DIAGNOSIS — N179 Acute kidney failure, unspecified: Principal | ICD-10-CM | POA: Diagnosis present

## 2023-05-09 DIAGNOSIS — M1990 Unspecified osteoarthritis, unspecified site: Secondary | ICD-10-CM | POA: Diagnosis present

## 2023-05-09 DIAGNOSIS — Z79899 Other long term (current) drug therapy: Secondary | ICD-10-CM | POA: Diagnosis not present

## 2023-05-09 DIAGNOSIS — N529 Male erectile dysfunction, unspecified: Secondary | ICD-10-CM | POA: Diagnosis present

## 2023-05-09 DIAGNOSIS — Z833 Family history of diabetes mellitus: Secondary | ICD-10-CM | POA: Diagnosis not present

## 2023-05-09 DIAGNOSIS — Z7984 Long term (current) use of oral hypoglycemic drugs: Secondary | ICD-10-CM | POA: Diagnosis not present

## 2023-05-09 DIAGNOSIS — N400 Enlarged prostate without lower urinary tract symptoms: Secondary | ICD-10-CM | POA: Diagnosis present

## 2023-05-09 DIAGNOSIS — F32A Depression, unspecified: Secondary | ICD-10-CM | POA: Diagnosis present

## 2023-05-09 DIAGNOSIS — Z96653 Presence of artificial knee joint, bilateral: Secondary | ICD-10-CM | POA: Diagnosis present

## 2023-05-09 DIAGNOSIS — Z8249 Family history of ischemic heart disease and other diseases of the circulatory system: Secondary | ICD-10-CM | POA: Diagnosis not present

## 2023-05-09 DIAGNOSIS — N1832 Chronic kidney disease, stage 3b: Secondary | ICD-10-CM | POA: Diagnosis present

## 2023-05-09 DIAGNOSIS — I10 Essential (primary) hypertension: Secondary | ICD-10-CM | POA: Diagnosis present

## 2023-05-09 DIAGNOSIS — Z888 Allergy status to other drugs, medicaments and biological substances status: Secondary | ICD-10-CM

## 2023-05-09 DIAGNOSIS — Z7982 Long term (current) use of aspirin: Secondary | ICD-10-CM

## 2023-05-09 DIAGNOSIS — K089 Disorder of teeth and supporting structures, unspecified: Secondary | ICD-10-CM

## 2023-05-09 DIAGNOSIS — E872 Acidosis, unspecified: Secondary | ICD-10-CM | POA: Diagnosis present

## 2023-05-09 DIAGNOSIS — F1010 Alcohol abuse, uncomplicated: Secondary | ICD-10-CM | POA: Diagnosis present

## 2023-05-09 DIAGNOSIS — I129 Hypertensive chronic kidney disease with stage 1 through stage 4 chronic kidney disease, or unspecified chronic kidney disease: Secondary | ICD-10-CM | POA: Diagnosis present

## 2023-05-09 DIAGNOSIS — R633 Feeding difficulties, unspecified: Secondary | ICD-10-CM | POA: Diagnosis present

## 2023-05-09 DIAGNOSIS — E119 Type 2 diabetes mellitus without complications: Secondary | ICD-10-CM

## 2023-05-09 DIAGNOSIS — R42 Dizziness and giddiness: Secondary | ICD-10-CM | POA: Diagnosis not present

## 2023-05-09 LAB — BASIC METABOLIC PANEL
Anion gap: 13 (ref 5–15)
BUN: 31 mg/dL — ABNORMAL HIGH (ref 8–23)
CO2: 18 mmol/L — ABNORMAL LOW (ref 22–32)
Calcium: 8.2 mg/dL — ABNORMAL LOW (ref 8.9–10.3)
Chloride: 109 mmol/L (ref 98–111)
Creatinine, Ser: 2.34 mg/dL — ABNORMAL HIGH (ref 0.61–1.24)
GFR, Estimated: 30 mL/min — ABNORMAL LOW (ref >60)
Glucose, Bld: 94 mg/dL (ref 70–99)
Potassium: 3.4 mmol/L — ABNORMAL LOW (ref 3.5–5.1)
Sodium: 140 mmol/L (ref 135–145)

## 2023-05-09 LAB — CBC WITH DIFFERENTIAL/PLATELET
Abs Immature Granulocytes: 0.02 10*3/uL (ref 0.00–0.07)
Basophils Absolute: 0 10*3/uL (ref 0.0–0.1)
Basophils Relative: 0 %
Eosinophils Absolute: 0 10*3/uL (ref 0.0–0.5)
Eosinophils Relative: 0 %
HCT: 36 % — ABNORMAL LOW (ref 39.0–52.0)
Hemoglobin: 11.4 g/dL — ABNORMAL LOW (ref 13.0–17.0)
Immature Granulocytes: 0 %
Lymphocytes Relative: 14 %
Lymphs Abs: 1.1 10*3/uL (ref 0.7–4.0)
MCH: 31.8 pg (ref 26.0–34.0)
MCHC: 31.7 g/dL (ref 30.0–36.0)
MCV: 100.6 fL — ABNORMAL HIGH (ref 80.0–100.0)
Monocytes Absolute: 0.6 10*3/uL (ref 0.1–1.0)
Monocytes Relative: 8 %
Neutro Abs: 5.6 10*3/uL (ref 1.7–7.7)
Neutrophils Relative %: 78 %
Platelets: 176 10*3/uL (ref 150–400)
RBC: 3.58 MIL/uL — ABNORMAL LOW (ref 4.22–5.81)
RDW: 13.5 % (ref 11.5–15.5)
WBC: 7.3 10*3/uL (ref 4.0–10.5)
nRBC: 0 % (ref 0.0–0.2)

## 2023-05-09 LAB — CREATININE, URINE, RANDOM: Creatinine, Urine: 100 mg/dL

## 2023-05-09 LAB — SODIUM, URINE, RANDOM: Sodium, Ur: 120 mmol/L

## 2023-05-09 LAB — CBC
HCT: 36.6 % — ABNORMAL LOW (ref 39.0–52.0)
Hemoglobin: 11.6 g/dL — ABNORMAL LOW (ref 13.0–17.0)
MCH: 32.3 pg (ref 26.0–34.0)
MCHC: 31.7 g/dL (ref 30.0–36.0)
MCV: 101.9 fL — ABNORMAL HIGH (ref 80.0–100.0)
Platelets: 146 10*3/uL — ABNORMAL LOW (ref 150–400)
RBC: 3.59 MIL/uL — ABNORMAL LOW (ref 4.22–5.81)
RDW: 13.5 % (ref 11.5–15.5)
WBC: 8.1 10*3/uL (ref 4.0–10.5)
nRBC: 0 % (ref 0.0–0.2)

## 2023-05-09 LAB — URINALYSIS, ROUTINE W REFLEX MICROSCOPIC
Bacteria, UA: NONE SEEN
Bilirubin Urine: NEGATIVE
Glucose, UA: NEGATIVE mg/dL
Hgb urine dipstick: NEGATIVE
Ketones, ur: NEGATIVE mg/dL
Nitrite: NEGATIVE
Protein, ur: NEGATIVE mg/dL
Specific Gravity, Urine: 1.012 (ref 1.005–1.030)
pH: 5 (ref 5.0–8.0)

## 2023-05-09 LAB — ETHANOL: Alcohol, Ethyl (B): 55 mg/dL — ABNORMAL HIGH (ref <10)

## 2023-05-09 LAB — LACTIC ACID, PLASMA: Lactic Acid, Venous: 4.1 mmol/L (ref 0.5–1.9)

## 2023-05-09 LAB — CBG MONITORING, ED: Glucose-Capillary: 78 mg/dL (ref 70–99)

## 2023-05-09 LAB — CREATININE, SERUM
Creatinine, Ser: 2.05 mg/dL — ABNORMAL HIGH (ref 0.61–1.24)
GFR, Estimated: 35 mL/min — ABNORMAL LOW (ref >60)

## 2023-05-09 LAB — GLUCOSE, CAPILLARY: Glucose-Capillary: 207 mg/dL — ABNORMAL HIGH (ref 70–99)

## 2023-05-09 MED ORDER — LACTATED RINGERS IV BOLUS
250.0000 mL | Freq: Once | INTRAVENOUS | Status: AC
  Administered 2023-05-09: 250 mL via INTRAVENOUS

## 2023-05-09 MED ORDER — ADULT MULTIVITAMIN W/MINERALS CH
1.0000 | ORAL_TABLET | Freq: Every day | ORAL | Status: DC
  Administered 2023-05-09 – 2023-05-12 (×4): 1 via ORAL
  Filled 2023-05-09 (×4): qty 1

## 2023-05-09 MED ORDER — VITAMIN B-12 1000 MCG PO TABS
1000.0000 ug | ORAL_TABLET | Freq: Every day | ORAL | Status: DC
  Administered 2023-05-09 – 2023-05-12 (×4): 1000 ug via ORAL
  Filled 2023-05-09 (×4): qty 1

## 2023-05-09 MED ORDER — ONDANSETRON HCL 4 MG PO TABS: 4.0000 mg | ORAL_TABLET | Freq: Four times a day (QID) | ORAL | Status: DC | PRN

## 2023-05-09 MED ORDER — ACETAMINOPHEN 650 MG RE SUPP: 650.0000 mg | Freq: Four times a day (QID) | RECTAL | Status: DC | PRN

## 2023-05-09 MED ORDER — LORAZEPAM 0.5 MG PO TABS: 0.5000 mg | ORAL_TABLET | Freq: Four times a day (QID) | ORAL | Status: DC | PRN

## 2023-05-09 MED ORDER — ENOXAPARIN SODIUM 40 MG/0.4ML IJ SOSY
40.0000 mg | PREFILLED_SYRINGE | INTRAMUSCULAR | Status: DC
  Administered 2023-05-09 – 2023-05-11 (×3): 40 mg via SUBCUTANEOUS
  Filled 2023-05-09 (×3): qty 0.4

## 2023-05-09 MED ORDER — THIAMINE MONONITRATE 100 MG PO TABS
100.0000 mg | ORAL_TABLET | Freq: Every day | ORAL | Status: DC
  Administered 2023-05-09 – 2023-05-10 (×2): 100 mg via ORAL
  Filled 2023-05-09 (×2): qty 1

## 2023-05-09 MED ORDER — INSULIN ASPART 100 UNIT/ML IJ SOLN
0.0000 [IU] | Freq: Every day | INTRAMUSCULAR | Status: DC
  Filled 2023-05-09: qty 0.05

## 2023-05-09 MED ORDER — ASPIRIN 81 MG PO TBEC
81.0000 mg | DELAYED_RELEASE_TABLET | Freq: Every day | ORAL | Status: DC
  Administered 2023-05-09 – 2023-05-12 (×4): 81 mg via ORAL
  Filled 2023-05-09 (×4): qty 1

## 2023-05-09 MED ORDER — SODIUM CHLORIDE 0.9 % IV SOLN: INTRAVENOUS | Status: AC

## 2023-05-09 MED ORDER — ORAL CARE MOUTH RINSE: 15.0000 mL | OROMUCOSAL | Status: DC | PRN

## 2023-05-09 MED ORDER — LORAZEPAM 1 MG PO TABS: 1.0000 mg | ORAL_TABLET | ORAL | Status: DC | PRN

## 2023-05-09 MED ORDER — ATORVASTATIN CALCIUM 10 MG PO TABS
20.0000 mg | ORAL_TABLET | ORAL | Status: DC
  Administered 2023-05-10: 20 mg via ORAL
  Filled 2023-05-09: qty 2

## 2023-05-09 MED ORDER — THIAMINE HCL 100 MG/ML IJ SOLN
100.0000 mg | Freq: Every day | INTRAMUSCULAR | Status: DC
  Filled 2023-05-09 (×2): qty 2

## 2023-05-09 MED ORDER — FOLIC ACID 1 MG PO TABS
1.0000 mg | ORAL_TABLET | Freq: Every day | ORAL | Status: DC
  Administered 2023-05-09 – 2023-05-12 (×4): 1 mg via ORAL
  Filled 2023-05-09 (×4): qty 1

## 2023-05-09 MED ORDER — OXYCODONE HCL 5 MG PO TABS: 5.0000 mg | ORAL_TABLET | ORAL | Status: DC | PRN

## 2023-05-09 MED ORDER — ENSURE ENLIVE PO LIQD
237.0000 mL | Freq: Three times a day (TID) | ORAL | Status: DC
  Administered 2023-05-10 – 2023-05-12 (×6): 237 mL via ORAL

## 2023-05-09 MED ORDER — INSULIN ASPART 100 UNIT/ML IJ SOLN
0.0000 [IU] | Freq: Three times a day (TID) | INTRAMUSCULAR | Status: DC
  Filled 2023-05-09: qty 0.15

## 2023-05-09 MED ORDER — ACETAMINOPHEN 325 MG PO TABS
650.0000 mg | ORAL_TABLET | Freq: Four times a day (QID) | ORAL | Status: DC | PRN
  Administered 2023-05-11 – 2023-05-12 (×2): 650 mg via ORAL
  Filled 2023-05-09 (×2): qty 2

## 2023-05-09 MED ORDER — SODIUM CHLORIDE 0.9% FLUSH
3.0000 mL | Freq: Two times a day (BID) | INTRAVENOUS | Status: DC
  Administered 2023-05-09 – 2023-05-12 (×6): 3 mL via INTRAVENOUS

## 2023-05-09 MED ORDER — SODIUM CHLORIDE 0.9 % IV BOLUS
1000.0000 mL | Freq: Once | INTRAVENOUS | Status: AC
  Administered 2023-05-09: 1000 mL via INTRAVENOUS

## 2023-05-09 MED ORDER — ONDANSETRON HCL 4 MG/2ML IJ SOLN: 4.0000 mg | Freq: Four times a day (QID) | INTRAMUSCULAR | Status: DC | PRN

## 2023-05-09 NOTE — ED Triage Notes (Signed)
Pt to ER via EMS from home.  Pt was outside of his apartment when he had a syncopal episode and fell.  Pt was assisted up to a chair by a bystander.  Pt with hypotension on EMS arrival.  Pt has hx of ETOH use, reports use today.  Pt given NS by EMS last BP 101/62.  Pt arrives alert and oriented x4

## 2023-05-09 NOTE — H&P (Signed)
History and Physical    John Fisher VZD:[id number] DOB: [birth year] DOA: 05/09/2023  PCP: [facility]  Patient coming from: Home  Chief Complaint: dizziness, fall  HPI: John Fisher is a 67 y.o. male with medical history significant of DM2, HTN, ED, OA, HLD and AUD who presents for pre-syncope and dizziness. He notes that he does not eat or drink well and does drink ETOH, sometimes to excess.  Right now he drinks on average 2 shots per day.  He was walking outside today when he felt dizzy and he grabbed onto the door handle to avoid falling and hitting his head.  He did not lose consciousness, but slowly slid to the ground, no trauma.  His neighbor called EMS.  The patient reports that this has happened to him "a gazillion" times. He feels it all stems from his teeth and not being able to eat well.  He further has a history of ETOH use and has been to rehab previously.  He has never actively withdrawn from ETOH and has no history of seizure.   Patient has had recurrent syncope and orthostatic hypotension in the past.  Last admission on 04/02/23 - 04/05/23 was for a similar presentation.  ETOH level was elevated (as well today) and he was found to be orthostatic on admission with mild AKI on CKD 3b.  He was given fluids.  His Last TTE was during this admission and showed normal EF, normal diastolic parameters, no aortic stenosis, with no significant change from previous.  He was asked to hold his valsartan at that time, and he has been.  Though when asked, he does not know the names of most of his medications and is not sure what he is taking.   ED Course: In the ED, he was noted to have a K of 3.4, CO2 of 18, BUN o f31, Cr of 2.34 (baseline is around 1.5-1.6), Calcium of 8.2, H/H mildly low with elevated MCV, normal glucose.  Will check a lactate given he has had elevated L.A in the past.    Review of Systems: As per HPI otherwise all other systems reviewed and are negative.   Past  Medical History:  Diagnosis Date   Diabetes mellitus    Erectile dysfunction    Hypertension    Hypogonadism male    Osteoarthritis     Past Surgical History:  Procedure Laterality Date   left knee replacement  02/13/2016   REPLACEMENT TOTAL KNEE Right     Social History  reports that he has never smoked. He has never used smokeless tobacco. He reports current alcohol use of about 14.0 standard drinks of alcohol per week. He reports current drug use. Drug: Marijuana.  Allergies  Allergen Reactions   Trospium Other (See Comments)    Retention of urine   Alfuzosin Anxiety and Other (See Comments)    Increased frequency of urination, also    Family History  Problem Relation Age of Onset   Diabetes Mother    Hypertension Father    Diabetes Father    Diabetes Brother     Prior to Admission medications   Medication Sig Start Date End Date Taking? Authorizing Provider  aspirin EC 81 MG tablet Take 81 mg by mouth daily.    [provider]  atorvastatin (LIPITOR) 20 MG tablet Take 20 mg by mouth 2 (two) times a week.    [provider]  cholecalciferol (VITAMIN D3) 25 MCG (1000 UNIT) tablet Take 1,000 Units  by mouth daily.    [provider]  cyanocobalamin (VITAMIN B12) 1000 MCG tablet Take 1,000 mcg by mouth daily.    [provider]  diclofenac Sodium (VOLTAREN) 1 % GEL Apply 2 g topically 4 (four) times daily as needed (for pain- affected areas).    [provider]  DULoxetine (CYMBALTA) 60 MG capsule Take 60 mg by mouth daily.    [provider]  fluticasone (FLONASE) 50 MCG/ACT nasal spray Place 1 spray into both nostrils 2 (two) times daily as needed for allergies or rhinitis. Patient taking differently: Place 2 sprays into both nostrils in the morning and at bedtime. 09/11/20   Ellamae Sia, DO  metFORMIN (GLUCOPHAGE) 500 MG tablet Take 500 mg by mouth daily with breakfast.    [provider]  naloxone (NARCAN)  nasal spray 4 mg/0.1 mL Place 1 spray into the nose once as needed (for accidental overdose). 01/12/22   [provider]  Nutritional Supplements (ENSURE PLUS PO) Take 1 Can by mouth in the morning and at bedtime.    [provider]  He cannot name his medications.  He does note that he was taken off of his blood pressure medication, but he is not sure about the others.   Physical Exam: Vitals:   05/09/23 1725 05/09/23 1745 05/09/23 1800 05/09/23 1830  BP: 117/73 113/70 114/70 137/85  Pulse: 78 77 75 78  Resp:  19 18 17   Temp: (!) 97 F (36.1 C)     SpO2: 99% 100% 99% 100%  Weight:      Height:        Constitutional: NAD, calm, comfortable Eyes: PERRL, lids and conjunctivae normal, mild dizziness with eye movement, no nystagmus.  ENMT: Mucous membranes are dry, poor dentition, multiple teeth missing and halitosis.  Neck: normal, supple Respiratory: Breathing comfortably on room air without any wheezing Cardiovascular: RR, NR, no murmur noted.  Pulses are intact and palpable in the radial and DP arteries bilaterally, extremities are thin without edema Abdomen: +BS, NT, ND Musculoskeletal: No joint deformity upper and lower extremities. Normal muscle tone.  Skin: no rashes, lesions, ulcers on exposed skin Neurologic: Strength is intact, moving easily in bed, no facial droop, EOMI, no slurring of speech.  No tremor Psychiatric: Normal judgment and insight. Alert and oriented x 3. Normal mood.    Labs on Admission: I have personally reviewed following labs and imaging studies  CBC: Recent Labs  Lab 05/09/23 1728  WBC 7.3  NEUTROABS 5.6  HGB 11.4*  HCT 36.0*  MCV 100.6*  PLT 176    Basic Metabolic Panel: Recent Labs  Lab 05/09/23 1728  NA 140  K 3.4*  CL 109  CO2 18*  GLUCOSE 94  BUN 31*  CREATININE 2.34*  CALCIUM 8.2*    GFR: Estimated Creatinine Clearance: 33.6 mL/min (A) (by C-G formula based on SCr of 2.34 mg/dL (H)).  Liver Function  Tests: No results for input(s): "AST", "ALT", "ALKPHOS", "BILITOT", "PROT", "ALBUMIN" in the last 168 hours.  Urine analysis:    Component Value Date/Time   COLORURINE STRAW (A) 04/03/2023 0727   APPEARANCEUR CLEAR 04/03/2023 0727   LABSPEC 1.011 04/03/2023 0727   PHURINE 6.0 04/03/2023 0727   GLUCOSEU 50 (A) 04/03/2023 0727   GLUCOSEU NEGATIVE 02/15/2013 1635   HGBUR NEGATIVE 04/03/2023 0727   BILIRUBINUR NEGATIVE 04/03/2023 0727   KETONESUR NEGATIVE 04/03/2023 0727   PROTEINUR NEGATIVE 04/03/2023 0727   UROBILINOGEN 0.2 02/15/2013 1635   NITRITE NEGATIVE  04/03/2023 0727   LEUKOCYTESUR NEGATIVE 04/03/2023 0727    Radiological Exams on Admission: No results found.  EKG: Independently reviewed. Abnormal R wave progression, no ST or TW changes  Assessment/Plan  Acute renal failure superimposed on stage 3b chronic kidney disease (HCC) Orthostatic hypotension Dizziness - Check orthostatics - IVF with NS at 125/hr for 12 hours - Check lactate - Trend renal function - Renal US, urine Na and Urine Cr for FENA - Check urinalysis - PT and OT evals - TTE just done 1 month ago, will not repeat.   Hypertension - Continue to hold blood pressure medication    BPH (benign prostatic hyperplasia) - Check Renal ultrasound and bladder scans for retention    Alcohol use disorder - CIWA with oral ativan PRN - Folic acid, multivitamin     Diabetes mellitus (HCC) - SSI, last A1C was 6.4, hold metformin    Poor dentition - Ensure and soft diet      DVT prophylaxis: Lovenox  Code Status:   Full  Disposition Plan:   Patient is from:  Home  Anticipated DC to:  Home  Anticipated DC date:  05/12/23  Anticipated DC barriers: Renal function, PT/OT eval  Consults called:  None  Admission status:  IP, med surg   Severity of Illness: The appropriate patient status for this patient is INPATIENT. Inpatient status is judged to be reasonable and necessary in order to provide the  required intensity of service to ensure the patient's safety. The patient's presenting symptoms, physical exam findings, and initial radiographic and laboratory data in the context of their chronic comorbidities is felt to place them at high risk for further clinical deterioration. Furthermore, it is not anticipated that the patient will be medically stable for discharge from the hospital within 2 midnights of admission.   * I certify that at the point of admission it is my clinical judgment that the patient will require inpatient hospital care spanning beyond 2 midnights from the point of admission due to high intensity of service, high risk for further deterioration and high frequency of surveillance required.Debe Coder MD [facility]  How to contact the [facility] Attending or Consulting provider 7A - 7P or covering provider during after hours 7P -7A, for this patient?   Check the care team in [facility] and look for a) attending/consulting [facility] provider listed and b) the [facility] team listed Log into [email or web address] and use [facility] universal password to access. If you do not have the password, please contact the hospital operator. Locate the [facility] provider you are looking for under [facility] and page to a number that you can be directly reached. If you still have difficulty reaching the provider, please page the [facility] (Director on Call) for the Hospitalists listed on amion for assistance.  05/09/2023, 8:00 PM

## 2023-05-09 NOTE — ED Provider Notes (Signed)
John Fisher Provider Note   CSN: [id number] Arrival date & time: 05/09/23  1709     History  Chief Complaint  Patient presents with   Loss of Consciousness    John Fisher is a 67 y.o. male with a history of diabetes mellitus, alcohol abuse, orthostatic hypotension, and BPH who presents to the ED today via EMS after a presyncopal event.  Patient reports that he was walking outside when he started to feel dizzy he grabbed onto the door handle so he would not hit his head.  He denies loss of consciousness or head injury.  He is not on blood thinners.  Patient's neighbor witnessed this event and called EMS.  Patient denies dizziness or any other concerns in the ED. He reports to drinking 1 beer and 1 shot of alcohol prior to this event.  He believes his dizziness is due to his "teeth."  Patient states that his teeth hurt because of that he does not eat often.  He says that he only eats every "30 to 48 hours."  He says that he supposed to get established with dentist through the [place] in the near future.  He was hospitalized about a month ago for the same complaint and was diagnosed with orthostatic hypotension at the time.    Home Medications Prior to Admission medications   Medication Sig Start Date End Date Taking? Authorizing Provider  aspirin EC 81 MG tablet Take 81 mg by mouth daily.    [provider]  atorvastatin (LIPITOR) 20 MG tablet Take 20 mg by mouth 2 (two) times a week.    [provider]  cholecalciferol (VITAMIN D3) 25 MCG (1000 UNIT) tablet Take 1,000 Units by mouth daily.    [provider]  cyanocobalamin (VITAMIN B12) 1000 MCG tablet Take 1,000 mcg by mouth daily.    [provider]  diclofenac Sodium (VOLTAREN) 1 % GEL Apply 2 g topically 4 (four) times daily as needed (for pain- affected areas).    [provider]  DULoxetine (CYMBALTA) 60 MG capsule Take 60 mg by mouth daily.     [provider]  fluticasone (FLONASE) 50 MCG/ACT nasal spray Place 1 spray into both nostrils 2 (two) times daily as needed for allergies or rhinitis. Patient taking differently: Place 2 sprays into both nostrils in the morning and at bedtime. 09/11/20   John Sia, DO  metFORMIN (GLUCOPHAGE) 500 MG tablet Take 500 mg by mouth daily with breakfast.    [provider]  naloxone (NARCAN) nasal spray 4 mg/0.1 mL Place 1 spray into the nose once as needed (for accidental overdose). 01/12/22   [provider]  Nutritional Supplements (ENSURE PLUS PO) Take 1 Can by mouth in the morning and at bedtime.    [provider]      Allergies    Trospium and Alfuzosin    Review of Systems   Review of Systems  Neurological:  Positive for dizziness.  All other systems reviewed and are negative.   Physical Exam Updated Vital Signs BP 137/85   Pulse 78   Temp (!) 97 F (36.1 C)   Resp 17   Ht 6' (1.829 m)   Wt 83.5 kg   SpO2 100%   BMI 24.95 kg/m  Physical Exam Vitals and nursing note reviewed.  Constitutional:      Appearance: Normal appearance.  HENT:     Head: Normocephalic and atraumatic.  Mouth/Throat:     Mouth: Mucous membranes are moist.  Eyes:     Conjunctiva/sclera: Conjunctivae normal.     Pupils: Pupils are equal, round, and reactive to light.  Cardiovascular:     Rate and Rhythm: Normal rate and regular rhythm.     Pulses: Normal pulses.     Heart sounds: Normal heart sounds.  Pulmonary:     Effort: Pulmonary effort is normal.     Breath sounds: Normal breath sounds.  Abdominal:     Palpations: Abdomen is soft.     Tenderness: There is no abdominal tenderness.  Musculoskeletal:     Right lower leg: No edema.     Left lower leg: No edema.  Skin:    General: Skin is warm and dry.     Findings: No rash.  Neurological:     General: No focal deficit present.     Mental Status: He is alert.     Sensory: No sensory deficit.      Motor: No weakness.  Psychiatric:        Mood and Affect: Mood normal.        Behavior: Behavior normal.     ED Results / Procedures / Treatments   Labs (all labs ordered are listed, but only abnormal results are displayed) Labs Reviewed  BASIC METABOLIC PANEL - Abnormal; Notable for the following components:      Result Value   Potassium 3.4 (*)    CO2 18 (*)    BUN 31 (*)    Creatinine, Ser 2.34 (*)    Calcium 8.2 (*)    GFR, Estimated 30 (*)    All other components within normal limits  CBC WITH DIFFERENTIAL/PLATELET - Abnormal; Notable for the following components:   RBC 3.58 (*)    Hemoglobin 11.4 (*)    HCT 36.0 (*)    MCV 100.6 (*)    All other components within normal limits  ETHANOL - Abnormal; Notable for the following components:   Alcohol, Ethyl (B) 55 (*)    All other components within normal limits  CBG MONITORING, ED    EKG EKG Interpretation Date/Time:  Sunday May 09 2023 17:28:58 EDT Ventricular Rate:  79 PR Interval:  190 QRS Duration:  83 QT Interval:  394 QTC Calculation: 452 R Axis:   -21  Text Interpretation: Sinus rhythm Abnormal R-wave progression, early transition Left ventricular hypertrophy Baseline wander in lead(s) V5 V6 No significant change since last tracing Confirmed by Linwood Dibbles [phone]) on 05/09/2023 5:45:03 PM  Radiology No results found.  Procedures Procedures: not indicated.   Medications Ordered in ED Medications  sodium chloride 0.9 % bolus 1,000 mL (0 mLs Intravenous Stopped 05/09/23 1858)    ED Course/ Medical Decision Making/ A&P                                 Medical Decision Making Amount and/or Complexity of Data Reviewed Labs: ordered. ECG/medicine tests: ordered.   This patient presents to the ED for concern of dizziness, this involves an extensive number of treatment options, and is a complaint that carries with it a high risk of complications and morbidity.   Differential diagnosis includes:  electrolyte abnormality, dehydration, AKI, acute alcohol intoxication, etc.   Comorbidities  See HPI above   Additional History  [facility]   Cardiac Monitoring / EKG  The patient was maintained on  a cardiac monitor.  I personally viewed and interpreted the cardiac monitored which showed: sinus rhythm with LVH and a heart rate of 78 bpm.   Lab Tests  I ordered and personally interpreted labs.  The pertinent results include:   CMP shows BUN of 31 and Cr of 2.34, otherwise WNL CBC is WNL Ethanol of 55 CBG is unremarkable   Consultations  I requested consultation with hospitalist,  and discussed lab and imaging findings as well as pertinent plan - they recommend: admission   Problem List / ED Course / Critical Interventions / Medication Management  Presyncope I ordered medications including: Normal saline for AKI  I have reviewed the patients home medicines and have made adjustments as needed   Social Determinants of Health  Alcohol use   Test / Admission - Considered  Discussed results with patient.  He is agreeable with plan for admission.        Final Clinical Impression(s) / ED Diagnoses Final diagnoses:  AKI (acute kidney injury) [facility])    Rx / DC Orders ED Discharge Orders     None         Maxwell Marion, PA-C 05/09/23 1933    Linwood Dibbles, MD 05/11/23 1226

## 2023-05-09 NOTE — ED Notes (Addendum)
Pt has finished with ultrasound US RENAL.

## 2023-05-10 DIAGNOSIS — N179 Acute kidney failure, unspecified: Secondary | ICD-10-CM

## 2023-05-10 LAB — BASIC METABOLIC PANEL
Anion gap: 4 — ABNORMAL LOW (ref 5–15)
BUN: 22 mg/dL (ref 8–23)
CO2: 23 mmol/L (ref 22–32)
Calcium: 8 mg/dL — ABNORMAL LOW (ref 8.9–10.3)
Chloride: 109 mmol/L (ref 98–111)
Creatinine, Ser: 2.06 mg/dL — ABNORMAL HIGH (ref 0.61–1.24)
GFR, Estimated: 35 mL/min — ABNORMAL LOW (ref >60)
Glucose, Bld: 105 mg/dL — ABNORMAL HIGH (ref 70–99)
Potassium: 3.7 mmol/L (ref 3.5–5.1)
Sodium: 136 mmol/L (ref 135–145)

## 2023-05-10 LAB — GLUCOSE, CAPILLARY
Glucose-Capillary: 92 mg/dL (ref 70–99)
Glucose-Capillary: 213 mg/dL — ABNORMAL HIGH (ref 70–99)
Glucose-Capillary: 180 mg/dL — ABNORMAL HIGH (ref 70–99)
Glucose-Capillary: 164 mg/dL — ABNORMAL HIGH (ref 70–99)

## 2023-05-10 LAB — LACTIC ACID, PLASMA: Lactic Acid, Venous: 0.9 mmol/L (ref 0.5–1.9)

## 2023-05-10 MED ORDER — SODIUM CHLORIDE 0.9 % IV SOLN: INTRAVENOUS | Status: DC

## 2023-05-10 NOTE — Progress Notes (Signed)
HOSPITALIST ROUNDING NOTE LAZERRICK SPARTA ZOX:[id number]  DOB: 1955/11/01  DOA: 05/09/2023  [facility]  05/10/2023,7:43 AM   LOS: 1 day      Code Status: Full   From: Home  current Dispo: Likely home     67 year old male  DM 2  HTN hypertension erectile dysfunction Chr Ethanolism Admit for Orthostatic hypotension in a setting of AKI  Plan  Dizziness secondary to AKI PTA was on metformin aspirin Proscar Creatinine is slightly improved with IV fluid repletion but we will continue IVF NS 125 cc/H  AKI Baseline creatinine about 1.26 Fluid as above  Alcohol use disorder Only drinks 1-2 beers a day and CIWA low-discontinue protocol  Severe dental caries with weight loss associated with this Follow-up with dental at the [facility]  Moderately controlled DM TY 2 Cannot use metformin with current GFR-?  SGL PT versus incretin based therapy When creatinine is less than 1.5 start either Amaryl or one of the above  Resolved lactic acidosis secondary to renal acidosis  DVT prophylaxis: Lovenox per pharmacy  Status is: Inpatient Remains inpatient appropriate because: Requires resolution    Subjective:  Upset about his teeth Has been ambulatory to some degree less dizzy  Objective + exam Vitals:   05/09/23 1830 05/09/23 2119 05/10/23 0124 05/10/23 0549  BP: 137/85 (!) 157/91 (!) 174/96 (!) 168/94  Pulse: 78 74 61 60  Resp: 17 19 17 18   Temp:  98.6 F (37 C) 98.4 F (36.9 C) 98.4 F (36.9 C)  TempSrc:  Oral Oral Oral  SpO2: 100% 100% 100% 100%  Weight:      Height:       Filed Weights   05/09/23 1717  Weight: 83.5 kg    Examination:  NAD NCAT no focal deficit Very poor dentition no caries however S1-S2 no murmur Abdomen soft  No lower extremity edema  Data Reviewed: reviewed   CBC    Component Value Date/Time   WBC 8.1 05/09/2023 2030   RBC 3.59 (L) 05/09/2023 2030   HGB 11.6 (L) 05/09/2023 2030   HCT 36.6 (L) 05/09/2023 2030   PLT 146 (L)  05/09/2023 2030   MCV 101.9 (H) 05/09/2023 2030   MCH 32.3 05/09/2023 2030   MCHC 31.7 05/09/2023 2030   RDW 13.5 05/09/2023 2030   LYMPHSABS 1.1 05/09/2023 1728   MONOABS 0.6 05/09/2023 1728   EOSABS 0.0 05/09/2023 1728   BASOSABS 0.0 05/09/2023 1728      Latest Ref Rng & Units 05/10/2023    4:22 AM 05/09/2023    8:30 PM 05/09/2023    5:28 PM  CMP  Glucose 70 - 99 mg/dL 811   94   BUN 8 - 23 mg/dL 22   31   Creatinine 9.14 - 1.24 mg/dL 7.82  9.56  2.13   Sodium 135 - 145 mmol/L 136   140   Potassium 3.5 - 5.1 mmol/L 3.7   3.4   Chloride 98 - 111 mmol/L 109   109   CO2 22 - 32 mmol/L 23   18   Calcium 8.9 - 10.3 mg/dL 8.0   8.2      Scheduled Meds:  aspirin EC  81 mg Oral Daily   atorvastatin  20 mg Oral Once per day on Monday Thursday   cyanocobalamin  1,000 mcg Oral Daily   enoxaparin (LOVENOX) injection  40 mg Subcutaneous Q24H   feeding supplement  237 mL Oral TID BM   folic acid  1  mg Oral Daily   insulin aspart  0-15 Units Subcutaneous TID WC   insulin aspart  0-5 Units Subcutaneous QHS   multivitamin with minerals  1 tablet Oral Daily   sodium chloride flush  3 mL Intravenous Q12H   Continuous Infusions:  Time  33  Rhetta Mura, MD  [facility]

## 2023-05-10 NOTE — Plan of Care (Signed)

## 2023-05-10 NOTE — Progress Notes (Signed)
   05/10/23 1720  TOC Assessment  TOC screening is complete Yes  Once discharged, how will the patient get to their discharge location? Family/Friend - Photographer  Expected Discharge Plan Home w Home Health Services  Final next level of care Home w Home Health Services  Barriers to Discharge No Barriers Identified  Patient states their goals for this hospitalization and ongoing recovery are: none stated  Choice offered to / list presented to   (pt is active with [facility] with HHPT service)  Living arrangements for the past 2 months Apartment  Lives with: Self  Current home services Home PT;Home OT  In-house Referral NA  DME Agency Other - Comment (pt active with [facility] with HHPT services)  HH Arranged PT;OT  HH Agency [facility]  Do you feel safe going back to the place where you live? Y  Permission granted to share information with  No  Patient language and need for interpreter reviewed: No  Criminal Activity/Legal Involvement Pertinent to Current Situation/Hospitalization No - Comment as needed  Need for Family Participation in Patient Care N  Care giver support system in place? Y  Appearance: Appears stated age  Attitude/Demeanor/Rapport Other (comment) (appropriate)  Affect (typically observed) Accepting;Calm  Orientation:  Oriented to Self;Oriented to Place;Oriented to  Time;Oriented to Situation  Alcohol / Substance Use Alcohol Use  Psych Involvement N

## 2023-05-10 NOTE — Progress Notes (Signed)
Even after educating the patient on the importance of tight glucose control, he refuses to take any insulin while in the hospital. He is adamant about not taking any insulin. Will continue to try and encourage patient.

## 2023-05-10 NOTE — Evaluation (Signed)
Physical Therapy Evaluation Patient Details Name: John Fisher MRN: [id number] DOB: 11/01/1955 Today's Date: 05/10/2023  History of Present Illness  67 yo male admitted to hospital on 05/09/2023 due to fall without trama or LOC with reports of feeling dizzy. Pt most recently hospitalized 7/19-7/22/2024 for similar presentation and 12/08/2022 due to fall on 3/25 in which pt struck his head. Pt was unable to perform fall recovery and was on the floor for some undisclosed time then able to crawl to bed prior to EMS arrival. Pt was hospitalized 3/21-3/24 secondary to orthostatic hypotension and alcoholic ketoacidosis. Pt reports limited PO intake and attributes to poor dentition with anticipation of dental implants in July through the [place]. CT negative for acute findings. Pt has PMH including but not limited to HTN, DM II, B TKA, and OA.  Clinical Impression     Pt admitted with above diagnosis.  Pt currently with functional limitations due to the deficits listed below (see PT Problem List). Pt in bed when PT returned, pt required encouragement for participation. Pt is S for bed mobility, S for sit to stand  from EOB to RW, CGA for static standing no UE support, pt able to maintain static standing balance for 5 mins with intermittent UE support,  gait tasks with CGA and RW up to 50 feet, gait assessed in personal room no AD and noted instability with LOB x 1 and min A to recover. Bp assessed due to hx of orthostatic hypotension, please see below. Pt left seated in recliner all needs in place. Pt will benefit from acute skilled PT to increase their independence and safety with mobility to allow discharge.   Bp supine at rest 135/90 (75 PR) Bp seated EOB 138/94 (88 PR) Bp immediate standing 125/100 (111 PR) Bp s/p 3 min standing 133/102 (114 PR)    If plan is discharge home, recommend the following: A little help with walking and/or transfers;A little help with bathing/dressing/bathroom;Assistance with  cooking/housework;Assist for transportation;Help with stairs or ramp for entrance   Can travel by private vehicle        Equipment Recommendations Rolling walker (2 wheels)  Recommendations for Other Services       Functional Status Assessment Patient has had a recent decline in their functional status and demonstrates the ability to make significant improvements in function in a reasonable and predictable amount of time.     Precautions / Restrictions Precautions Precautions: Fall (orthostatic/dizziness) Restrictions Weight Bearing Restrictions: No      Mobility  Bed Mobility Overal bed mobility: Needs Assistance Bed Mobility: Supine to Sit     Supine to sit: Supervision, HOB elevated     General bed mobility comments: increased time and cues for encouragement    Transfers Overall transfer level: Needs assistance Equipment used: Rolling walker (2 wheels) Transfers: Sit to/from Stand Sit to Stand: Supervision, Via lift equipment           General transfer comment: cues for safety and proper UE placement    Ambulation/Gait Ambulation/Gait assistance: Contact guard assist Gait Distance (Feet): 50 Feet Assistive device: Rolling walker (2 wheels) Gait Pattern/deviations: Step-through pattern Gait velocity: decreased     General Gait Details: pt repoted if he did not have the walker he would have fallen 2 times with gait tasks in hallway, no overt LOB noted. once in personal room pt elected to amb without AD and noted instabiltiy, staggering L and R and LOB x 1 wtih min A to recover with turn in  bathoom  Stairs            Wheelchair Mobility     Tilt Bed    Modified Rankin (Stroke Patients Only)       Balance Overall balance assessment: Needs assistance, History of Falls (pt indicates > 10 falls in the past 6 months) Sitting-balance support: Feet supported Sitting balance-Leahy Scale: Good     Standing balance support: Bilateral upper  extremity supported, During functional activity, Reliant on assistive device for balance Standing balance-Leahy Scale: Fair Standing balance comment: pt is able to maintain static standing balance without UE support and no apparent LOB, noted LOB with dynamic mobility tasks                             Pertinent Vitals/Pain Pain Assessment Pain Assessment: 0-10 Pain Score: 10-Worst pain ever Pain Location: B LE--R knee> L knee, B feet as well as c/o mouth pain Pain Descriptors / Indicators: Aching, Constant, Discomfort Pain Intervention(s): Limited activity within patient's tolerance, Monitored during session    Home Living Family/patient expects to be discharged to:: Private residence Living Arrangements: Alone Available Help at Discharge: Available PRN/intermittently Type of Home: Apartment Home Access: Level entry       Home Layout: One level Home Equipment: Cane - single Librarian, academic (2 wheels)      Prior Function Prior Level of Function : Independent/Modified Independent;Driving             Mobility Comments: reports  gets out, daughter can get food. pt states SPC for community navigation and furniture walks/surfs in home       Extremity/Trunk Assessment        Lower Extremity Assessment Lower Extremity Assessment: Overall WFL for tasks assessed    Cervical / Trunk Assessment Cervical / Trunk Assessment:  (head forward)  Communication   Communication Communication: No apparent difficulties  Cognition Arousal: Alert Behavior During Therapy: WFL for tasks assessed/performed Overall Cognitive Status: Within Functional Limits for tasks assessed                                          General Comments      Exercises     Assessment/Plan    PT Assessment Patient needs continued PT services  PT Problem List Decreased activity tolerance;Decreased balance;Decreased mobility;Decreased knowledge of use of DME;Decreased  safety awareness;Pain       PT Treatment Interventions DME instruction;Gait training;Functional mobility training;Therapeutic activities;Therapeutic exercise;Balance training;Neuromuscular re-education;Patient/family education    PT Goals (Current goals can be found in the Care Plan section)  Acute Rehab PT Goals Patient Stated Goal: to get my mouth fixed so I can eat and drink more PT Goal Formulation: With patient Time For Goal Achievement: 05/24/23 Potential to Achieve Goals: Good    Frequency Min 1X/week     Co-evaluation PT/OT/SLP Co-Evaluation/Treatment: Yes Reason for Co-Treatment: Complexity of the patient's impairments (multi-system involvement);Necessary to address cognition/behavior during functional activity;For patient/therapist safety;To address functional/ADL transfers PT goals addressed during session: Mobility/safety with mobility;Balance;Proper use of DME OT goals addressed during session: ADL's and self-care;Strengthening/ROM       AM-PAC PT "6 Clicks" Mobility  Outcome Measure Help needed turning from your back to your side while in a flat bed without using bedrails?: None Help needed moving from lying on your back to sitting on the side of a  flat bed without using bedrails?: None Help needed moving to and from a bed to a chair (including a wheelchair)?: A Little Help needed standing up from a chair using your arms (e.g., wheelchair or bedside chair)?: A Little Help needed to walk in hospital room?: A Little Help needed climbing 3-5 steps with a railing? : A Lot 6 Click Score: 19    End of Session Equipment Utilized During Treatment: Gait belt Activity Tolerance: Patient tolerated treatment well Patient left: in chair;with call bell/phone within reach Nurse Communication: Mobility status PT Visit Diagnosis: Unsteadiness on feet (R26.81);Other abnormalities of gait and mobility (R26.89);Repeated falls (R29.6);Pain;Difficulty in walking, not elsewhere  classified (R26.2) Pain - Right/Left:  (both) Pain - part of body: Knee;Ankle and joints of foot;Leg (mouth)    Time: 2952-8413 PT Time Calculation (min) (ACUTE ONLY): 24 min   Charges:   PT Evaluation $PT Eval Low Complexity: 1 Low   PT General Charges $$ ACUTE PT VISIT: 1 Visit         Johnny Bridge, PT Acute Rehab   Jacqualyn Posey 05/10/2023, 12:51 PM

## 2023-05-10 NOTE — Evaluation (Signed)
Occupational Therapy Evaluation Patient Details Name: John Fisher MRN: [id number] DOB: 03-29-1956 Today's Date: 05/10/2023   History of Present Illness 67 yo male admitted to hospital on 05/09/2023 due to fall without trama or LOC with reports of feeling dizzy. Pt most recently hospitalized 7/19-7/22/2024 for similar presentation and 12/08/2022 due to fall on 3/25 in which pt struck his head. Pt was unable to perform fall recovery and was on the floor for some undisclosed time then able to crawl to bed prior to EMS arrival. Pt was hospitalized 3/21-3/24 secondary to orthostatic hypotension and alcoholic ketoacidosis. Pt reports limited PO intake and attributes to poor dentition with anticipation of dental implants in July through the [place]. CT negative for acute findings. Pt has PMH including but not limited to HTN, DM II, B TKA, and OA.   Clinical Impression   Pt presents at decline in function and safety with ADLs and ADL mobility with impaired balance and endurance. Pt reports mouth/teeth, B LE (knees and feet) pain. PTA pt lived alone in an apartment and reports that he was Ind with ADLs/selfcare, home mgt, cooking but that he no longer gets intiothe shower because of falls. Pt currently requires CGA with LB ADLs  and grooming/hygiene in standing and functional mobility using RW. BP readings taken due to hx of orthostatic hypotension, please see below. Pt would benefit from acute OT services to address impairments to maximize level of function and safety   BP supine at rest 135/90  BP seated EOB 138/94  BP immediate standing 125/100 BP min standing 133/102       If plan is discharge home, recommend the following: A little help with bathing/dressing/bathroom;A little help with walking and/or transfers;Assistance with cooking/housework    Functional Status Assessment  Patient has had a recent decline in their functional status and demonstrates the ability to make significant improvements in  function in a reasonable and predictable amount of time.  Equipment Recommendations  None recommended by OT    Recommendations for Other Services       Precautions / Restrictions Precautions Precautions: Fall Restrictions Weight Bearing Restrictions: No      Mobility Bed Mobility Overal bed mobility: Needs Assistance       Supine to sit: Supervision, HOB elevated     General bed mobility comments: increased time and cues for encouragement    Transfers Overall transfer level: Needs assistance Equipment used: Rolling walker (2 wheels) Transfers: Sit to/from Stand Sit to Stand: Supervision           General transfer comment: cues for safety and proper UE placement      Balance Overall balance assessment: Needs assistance, History of Falls Sitting-balance support: Feet supported Sitting balance-Leahy Scale: Good     Standing balance support: Bilateral upper extremity supported, During functional activity, Reliant on assistive device for balance Standing balance-Leahy Scale: Fair                             ADL either performed or assessed with clinical judgement   ADL Overall ADL's : Needs assistance/impaired Eating/Feeding: Independent   Grooming: Wash/dry hands;Wash/dry face;Contact guard assist;Standing   Upper Body Bathing: Contact guard assist Upper Body Bathing Details (indicate cue type and reason): simulated Lower Body Bathing: Contact guard assist;Sit to/from stand Lower Body Bathing Details (indicate cue type and reason): simulated Upper Body Dressing : Set up   Lower Body Dressing: Contact guard assist   Toilet Transfer:  Contact guard assist;Ambulation;Rolling walker (2 wheels);Regular Toilet   Toileting- Clothing Manipulation and Hygiene: Contact guard assist;Sit to/from stand               Vision Baseline Vision/History: 0 No visual deficits Ability to See in Adequate Light: 0 Adequate Patient Visual Report: No change  from baseline       Perception         Praxis         Pertinent Vitals/Pain Pain Assessment Pain Assessment: 0-10 Pain Score: 10-Worst pain ever Pain Location: B LE--R knee> L knee, B feet as well as c/o mouth pain Pain Descriptors / Indicators: Aching, Constant, Discomfort Pain Intervention(s): Limited activity within patient's tolerance, Monitored during session     Extremity/Trunk Assessment Upper Extremity Assessment Upper Extremity Assessment: Overall WFL for tasks assessed   Lower Extremity Assessment Lower Extremity Assessment: Defer to PT evaluation   Cervical / Trunk Assessment Cervical / Trunk Assessment:  (head forward)   Communication Communication Communication: No apparent difficulties   Cognition Arousal: Alert Behavior During Therapy: WFL for tasks assessed/performed Overall Cognitive Status: Within Functional Limits for tasks assessed                                       General Comments       Exercises     Shoulder Instructions      Home Living Family/patient expects to be discharged to:: Private residence Living Arrangements: Alone Available Help at Discharge: Available PRN/intermittently Type of Home: Apartment Home Access: Level entry     Home Layout: One level     Bathroom Shower/Tub: Tub/shower unit;Curtain   Firefighter: Standard Bathroom Accessibility: Yes   Home Equipment: Cane - single point;Rolling Walker (2 wheels)          Prior Functioning/Environment Prior Level of Function : Independent/Modified Independent;Driving             Mobility Comments: reports  gets out, daughter can get food. pt states SPC for community navigation and furniture walks/surfs in home ADLs Comments: Pt reports he was Ind with ADLs/selfcare, cooking; reports he has not been able to take a shower in over a year due to falls        OT Problem List: Decreased activity tolerance;Decreased coordination;Impaired  balance (sitting and/or standing)      OT Treatment/Interventions: Self-care/ADL training;Balance training;Patient/family education    OT Goals(Current goals can be found in the care plan section) Acute Rehab OT Goals Patient Stated Goal: go home OT Goal Formulation: With patient Time For Goal Achievement: 05/24/23 Potential to Achieve Goals: Good ADL Goals Pt Will Perform Grooming: with supervision;with modified independence;standing Pt Will Perform Lower Body Bathing: with supervision;with modified independence;sit to/from stand Pt Will Perform Lower Body Dressing: with supervision;with modified independence;sit to/from stand Pt Will Transfer to Toilet: with supervision;with modified independence;ambulating Pt Will Perform Toileting - Clothing Manipulation and hygiene: with supervision;with modified independence;sit to/from stand  OT Frequency: Min 1X/week    Co-evaluation   Reason for Co-Treatment: Complexity of the patient's impairments (multi-system involvement);Necessary to address cognition/behavior during functional activity;For patient/therapist safety;To address functional/ADL transfers PT goals addressed during session: Mobility/safety with mobility;Balance;Proper use of DME OT goals addressed during session: ADL's and self-care;Strengthening/ROM      AM-PAC OT "6 Clicks" Daily Activity     Outcome Measure Help from another person eating meals?: None Help from another person taking care  of personal grooming?: A Little Help from another person toileting, which includes using toliet, bedpan, or urinal?: A Little Help from another person bathing (including washing, rinsing, drying)?: A Little Help from another person to put on and taking off regular upper body clothing?: None Help from another person to put on and taking off regular lower body clothing?: A Little 6 Click Score: 20   End of Session Equipment Utilized During Treatment: Gait belt;Rolling walker (2  wheels)  Activity Tolerance: Patient tolerated treatment well Patient left: in chair  OT Visit Diagnosis: History of falling (Z91.81);Pain Pain - part of body: Leg (Both LEs, mouth)                Time: 1610-9604 OT Time Calculation (min): 23 min Charges:  OT General Charges $OT Visit: 1 Visit OT Evaluation $OT Eval Low Complexity: 1 Low    Galen Manila 05/10/2023, 2:18 PM

## 2023-05-11 DIAGNOSIS — N1832 Chronic kidney disease, stage 3b: Secondary | ICD-10-CM | POA: Diagnosis present

## 2023-05-11 DIAGNOSIS — N179 Acute kidney failure, unspecified: Secondary | ICD-10-CM | POA: Diagnosis not present

## 2023-05-11 LAB — GLUCOSE, CAPILLARY
Glucose-Capillary: 148 mg/dL — ABNORMAL HIGH (ref 70–99)
Glucose-Capillary: 172 mg/dL — ABNORMAL HIGH (ref 70–99)
Glucose-Capillary: 185 mg/dL — ABNORMAL HIGH (ref 70–99)
Glucose-Capillary: 143 mg/dL — ABNORMAL HIGH (ref 70–99)

## 2023-05-11 LAB — BASIC METABOLIC PANEL
Anion gap: 12 (ref 5–15)
BUN: 15 mg/dL (ref 8–23)
CO2: 23 mmol/L (ref 22–32)
Calcium: 8.5 mg/dL — ABNORMAL LOW (ref 8.9–10.3)
Chloride: 99 mmol/L (ref 98–111)
Creatinine, Ser: 1.8 mg/dL — ABNORMAL HIGH (ref 0.61–1.24)
GFR, Estimated: 41 mL/min — ABNORMAL LOW (ref >60)
Glucose, Bld: 151 mg/dL — ABNORMAL HIGH (ref 70–99)
Potassium: 3 mmol/L — ABNORMAL LOW (ref 3.5–5.1)
Sodium: 134 mmol/L — ABNORMAL LOW (ref 135–145)

## 2023-05-11 MED ORDER — FINASTERIDE 5 MG PO TABS
5.0000 mg | ORAL_TABLET | Freq: Every day | ORAL | Status: DC
  Administered 2023-05-11 – 2023-05-12 (×2): 5 mg via ORAL
  Filled 2023-05-11 (×2): qty 1

## 2023-05-11 MED ORDER — POTASSIUM CHLORIDE CRYS ER 20 MEQ PO TBCR
40.0000 meq | EXTENDED_RELEASE_TABLET | Freq: Once | ORAL | Status: AC
  Administered 2023-05-11: 40 meq via ORAL
  Filled 2023-05-11: qty 2

## 2023-05-11 NOTE — TOC Progression Note (Signed)
Transition of Care [facility]) - Progression Note    Patient Details  Name: John Fisher MRN: [id number] Date of Birth: [birth year]  Transition of [facility] CM/SW Contact  Harriett Sine, RN Phone Number:[phone]  05/11/2023, 2:47 PM  Clinical Narrative:     Spoke with pt at bedside about HHPT and d/c plans. Pt states [facility] unsure of [facility] but has all the documents at home.   States he will keep his HHPT with [facility].  Expected Discharge Plan: Home w Home Health Services Barriers to Discharge: No Barriers Identified  Expected Discharge Plan and Services In-house Referral: NA     Living arrangements for the past 2 months: Apartment                   DME Agency: Other - Comment (pt active with [facility] with HHPT services)       HH Arranged: PT, OT HH Agency: [facility]         Social Determinants of Health (SDOH) Interventions SDOH Screenings   Food Insecurity: No Food Insecurity (05/10/2023)  Housing: Low Risk  (05/10/2023)  Transportation Needs: No Transportation Needs (05/10/2023)  Utilities: Not At Risk (05/10/2023)  Social Connections: Unknown (03/11/2022)   Received from [facility]  Tobacco Use: Low Risk  (05/09/2023)    Readmission Risk Interventions    04/04/2023   12:36 PM  Readmission Risk Prevention Plan  Transportation Screening Complete  PCP or Specialist Appt within 5-7 Days Complete  Home Care Screening Complete  Medication Review (RN CM) Complete

## 2023-05-11 NOTE — Progress Notes (Signed)
Progress Note   Patient: John Fisher JJO:[id number] DOB: 12/10/1955 DOA: 05/09/2023     2 DOS: the patient was seen and examined on 05/11/2023   Brief hospital course:  67yo male with h/o DM, HTN, HLD, stage 3b CKD, and ETOH use d/o presenting with dizziness and pre-syncope.  He was last hospitalized from 7/19-22 for a similar presentation.  Mildly orthostatic with elevated ETOH level on presentation with AKI.    Assessment and Plan:  AKI superimposed on stage 3b chronic kidney disease (HCC) Patient with recurrent dizziness Not orthostatic on admission Has had home therapy, recommended to continue Has received IVF since admission, will stop and continue to follow Soft foods at home recommended until dentures in place Appears to have stable renal function at this time, back to baseline Will plan to keep one more night, likely dc to home tomorrow    BPH (benign prostatic hyperplasia) Resume finasteride    Alcohol use disorder CIWA with oral ativan PRN Folic acid, multivitamin  Denies having an issue with alcohol    Diabetes mellitus (HCC) A1c 6.4, reasonable control hold Glucophage Cover with moderate-scale SSI     Poor dentition Ensure and soft diet  HLD Continue atorvastatin  Depression Continue duloxetine    Consultants: PT OT  Procedures: None  Antibiotics: None  30 Day Unplanned Readmission Risk Score    Flowsheet Row ED to Hosp-Admission (Current) from 05/09/2023 in [facility]  30 Day Unplanned Readmission Risk Score (%) 17.27 Filed at 05/11/2023 0801       This score is the patient's risk of an unplanned readmission within 30 days of being discharged (0 -100%). The score is based on dignosis, age, lab data, medications, orders, and past utilization.   Low:  0-14.9   Medium: 15-21.9   High: 22-29.9   Extreme: 30 and above           Subjective: Patient reports not feeling well today.  He says he barely  drinks at home (1-2 shots daily) and doesn't think this is related to current presentation.  Instead, he had teeth pulled at the [place] and is undergoing dental treatment.  He has had difficulty eating and doesn't drink either.  He lays in bed for up to 20 hours a day.   Objective: Vitals:   05/11/23 0528 05/11/23 1537  BP: (!) 170/102 (!) 145/96  Pulse: 66 71  Resp: 18 16  Temp: 98.6 F (37 C) 97.8 F (36.6 C)  SpO2: 100% 99%    Intake/Output Summary (Last 24 hours) at 05/11/2023 1727 Last data filed at 05/11/2023 1601 Gross per 24 hour  Intake --  Output 2100 ml  Net -2100 ml   Filed Weights   05/09/23 1717  Weight: 83.5 kg    Exam:  General:  Appears calm and comfortable and is in NAD Eyes:  EOMI, normal lids, iris ENT:  grossly normal hearing, lips & tongue, mmm; some absent dentition Neck:  no LAD, masses or thyromegaly Cardiovascular:  RRR, no m/r/g. No LE edema.  Respiratory:   CTA bilaterally with no wheezes/rales/rhonchi.  Normal respiratory effort. Abdomen:  soft, NT, ND Skin:  no rash or induration seen on limited exam Musculoskeletal:  grossly normal tone BUE/BLE, good ROM, no bony abnormality Psychiatric: blunted mood and affect, speech fluent and appropriate, AOx3 Neurologic:  CN 2-12 grossly intact, moves all extremities in coordinated fashion  Data Reviewed: I have reviewed the patient's lab results since admission.  Pertinent  labs for today include:  K+ 3.0 Glucose 151 BUN 15/Creatinine 1.8/GFR 41; 31/2.34/30 on 8/25     Family Communication: None present  Disposition: Status is: Inpatient Remains inpatient appropriate because: has been on ongoing IVF  Planned Discharge Destination: Home with Home Health    Time spent: 50 minutes  Author: Jonah Blue, MD 05/11/2023 5:27 PM  For on call review [email or web address].

## 2023-05-11 NOTE — Progress Notes (Signed)
Physical Therapy Treatment Patient Details Name: John Fisher MRN: [id number] DOB: 01-Apr-1956 Today's Date: 05/11/2023   History of Present Illness 67 yo male admitted to hospital on 05/09/2023 due to fall without trama or LOC with reports of feeling dizzy. Pt most recently hospitalized 7/19-7/22/2024 for similar presentation and 12/08/2022 due to fall on 3/25 in which pt struck his head. Pt was unable to perform fall recovery and was on the floor for some undisclosed time then able to crawl to bed prior to EMS arrival. Pt was hospitalized 3/21-3/24 secondary to orthostatic hypotension and alcoholic ketoacidosis. Pt reports limited PO intake and attributes to poor dentition with anticipation of dental implants in July through the [place]. CT negative for acute findings. Pt has PMH including but not limited to HTN, DM II, B TKA, and OA.    PT Comments   Pt admitted with above diagnosis.  Pt currently with functional limitations due to the deficits listed below (see PT Problem List). Pt in bed when PT arrived. Pt required encouragement for participation. Pt performed supine <> sit mod I. sit to stand from EOB to [facility] with S and min cues, gait assessed with SPC in hallway and discontinuous, unstable erratic pattern with pt reaching for items in personal room and handrail in hallway to assist with stabilization 50 feet and pt reported "is this not enough." PT strongly recommends use of RW in home setting. Pt verbalized understanding. Pt elected to return to bed and all needs in place. Pt will benefit from acute skilled PT to increase their independence and safety with mobility to allow discharge.      If plan is discharge home, recommend the following: A little help with walking and/or transfers;A little help with bathing/dressing/bathroom;Assistance with cooking/housework;Assist for transportation;Help with stairs or ramp for entrance   Can travel by private vehicle        Equipment Recommendations  Rolling  walker (2 wheels)    Recommendations for Other Services       Precautions / Restrictions Precautions Precautions: Fall Restrictions Weight Bearing Restrictions: No     Mobility  Bed Mobility Overal bed mobility: Needs Assistance Bed Mobility: Supine to Sit, Sit to Supine     Supine to sit: HOB elevated, Modified independent (Device/Increase time)     General bed mobility comments: increased time no cues    Transfers Overall transfer level: Needs assistance Equipment used: Straight cane Transfers: Sit to/from Stand Sit to Stand: Supervision           General transfer comment: cues for safety and proper UE placement    Ambulation/Gait Ambulation/Gait assistance: Contact guard assist Gait Distance (Feet): 50 Feet Assistive device: Straight cane Gait Pattern/deviations: Step-to pattern, Staggering left, Staggering right Gait velocity: decreased     General Gait Details: noted instabilty with use of SPC with pt reaching for objects in personal room and handrail in hallway, PT strongly recommends use of RW in home setting . pt indicates he has at [facility] but that it is too big.   Stairs             Wheelchair Mobility     Tilt Bed    Modified Rankin (Stroke Patients Only)       Balance Overall balance assessment: Needs assistance, History of Falls Sitting-balance support: Feet supported Sitting balance-Leahy Scale: Good     Standing balance support: Bilateral upper extremity supported, During functional activity, Reliant on assistive device for balance Standing balance-Leahy Scale: Fair Standing balance comment: pt  is able to maintain static standing balance without UE support and no apparent LOB, noted LOB with dynamic mobility tasks without AD                            Cognition Arousal: Alert Behavior During Therapy: WFL for tasks assessed/performed Overall Cognitive Status: Within Functional Limits for tasks assessed                                           Exercises      General Comments        Pertinent Vitals/Pain Pain Assessment Pain Assessment: 0-10 Pain Score: 6  Pain Location: B LE--R knee> L knee, B feet as well as c/o mouth pain Pain Descriptors / Indicators: Aching, Constant, Discomfort Pain Intervention(s): Limited activity within patient's tolerance, Monitored during session    Home Living                          Prior Function            PT Goals (current goals can now be found in the care plan section) Acute Rehab PT Goals Patient Stated Goal: to get my mouth fixed so I can eat and drink more PT Goal Formulation: With patient Time For Goal Achievement: 05/24/23 Potential to Achieve Goals: Good Progress towards PT goals: Progressing toward goals    Frequency    Min 1X/week      PT Plan      Co-evaluation              AM-PAC PT "6 Clicks" Mobility   Outcome Measure  Help needed turning from your back to your side while in a flat bed without using bedrails?: None Help needed moving from lying on your back to sitting on the side of a flat bed without using bedrails?: None Help needed moving to and from a bed to a chair (including a wheelchair)?: A Little Help needed standing up from a chair using your arms (e.g., wheelchair or bedside chair)?: A Little Help needed to walk in hospital room?: A Little Help needed climbing 3-5 steps with a railing? : A Lot 6 Click Score: 19    End of Session Equipment Utilized During Treatment: Gait belt Activity Tolerance: Patient tolerated treatment well Patient left: with bed alarm set;in bed;with call bell/phone within reach Nurse Communication: Mobility status PT Visit Diagnosis: Unsteadiness on feet (R26.81);Other abnormalities of gait and mobility (R26.89);Repeated falls (R29.6);Pain;Difficulty in walking, not elsewhere classified (R26.2) Pain - Right/Left:  (both) Pain - part of body: Knee;Ankle  and joints of foot;Leg (mouth)     Time: 9528-4132 PT Time Calculation (min) (ACUTE ONLY): 14 min  Charges:    $Gait Training: 8-22 mins PT General Charges $$ ACUTE PT VISIT: 1 Visit                     Johnny Bridge, PT Acute Rehab    Jacqualyn Posey 05/11/2023, 12:02 PM

## 2023-05-12 DIAGNOSIS — N179 Acute kidney failure, unspecified: Secondary | ICD-10-CM | POA: Diagnosis not present

## 2023-05-12 LAB — CBC WITH DIFFERENTIAL/PLATELET
Abs Immature Granulocytes: 0.01 10*3/uL (ref 0.00–0.07)
Basophils Absolute: 0 10*3/uL (ref 0.0–0.1)
Basophils Relative: 0 %
Eosinophils Absolute: 0 10*3/uL (ref 0.0–0.5)
Eosinophils Relative: 1 %
HCT: 34.8 % — ABNORMAL LOW (ref 39.0–52.0)
Hemoglobin: 11.6 g/dL — ABNORMAL LOW (ref 13.0–17.0)
Immature Granulocytes: 0 %
Lymphocytes Relative: 25 %
Lymphs Abs: 1.4 10*3/uL (ref 0.7–4.0)
MCH: 32.5 pg (ref 26.0–34.0)
MCHC: 33.3 g/dL (ref 30.0–36.0)
MCV: 97.5 fL (ref 80.0–100.0)
Monocytes Absolute: 0.5 10*3/uL (ref 0.1–1.0)
Monocytes Relative: 8 %
Neutro Abs: 3.6 10*3/uL (ref 1.7–7.7)
Neutrophils Relative %: 66 %
Platelets: 149 10*3/uL — ABNORMAL LOW (ref 150–400)
RBC: 3.57 MIL/uL — ABNORMAL LOW (ref 4.22–5.81)
RDW: 13.3 % (ref 11.5–15.5)
WBC: 5.5 10*3/uL (ref 4.0–10.5)
nRBC: 0 % (ref 0.0–0.2)

## 2023-05-12 LAB — BASIC METABOLIC PANEL
Anion gap: 8 (ref 5–15)
BUN: 15 mg/dL (ref 8–23)
CO2: 27 mmol/L (ref 22–32)
Calcium: 8.7 mg/dL — ABNORMAL LOW (ref 8.9–10.3)
Chloride: 100 mmol/L (ref 98–111)
Creatinine, Ser: 1.81 mg/dL — ABNORMAL HIGH (ref 0.61–1.24)
GFR, Estimated: 40 mL/min — ABNORMAL LOW (ref >60)
Glucose, Bld: 288 mg/dL — ABNORMAL HIGH (ref 70–99)
Potassium: 4 mmol/L (ref 3.5–5.1)
Sodium: 135 mmol/L (ref 135–145)

## 2023-05-12 LAB — GLUCOSE, CAPILLARY
Glucose-Capillary: 120 mg/dL — ABNORMAL HIGH (ref 70–99)
Glucose-Capillary: 240 mg/dL — ABNORMAL HIGH (ref 70–99)

## 2023-05-12 NOTE — Hospital Course (Signed)
67yo male with h/o DM, HTN, HLD, stage 3b CKD, and ETOH use d/o presenting with dizziness and pre-syncope. He was last hospitalized from 7/19-22 for a similar presentation. Mildly orthostatic with elevated ETOH level on presentation with AKI.

## 2023-05-12 NOTE — Plan of Care (Signed)
Discharge instructions given to patient and he verbalized understanding. Problem: Education: Goal: Knowledge of General Education information will improve Description: Including pain rating scale, medication(s)/side effects and non-pharmacologic comfort measures Outcome: Completed/Met   Problem: Health Behavior/Discharge Planning: Goal: Ability to manage health-related needs will improve Outcome: Completed/Met   Problem: Clinical Measurements: Goal: Ability to maintain clinical measurements within normal limits will improve Outcome: Completed/Met Goal: Will remain free from infection Outcome: Completed/Met Goal: Diagnostic test results will improve Outcome: Completed/Met Goal: Respiratory complications will improve Outcome: Completed/Met Goal: Cardiovascular complication will be avoided Outcome: Completed/Met   Problem: Activity: Goal: Risk for activity intolerance will decrease Outcome: Completed/Met   Problem: Nutrition: Goal: Adequate nutrition will be maintained Outcome: Completed/Met   Problem: Coping: Goal: Level of anxiety will decrease Outcome: Completed/Met   Problem: Elimination: Goal: Will not experience complications related to bowel motility Outcome: Completed/Met Goal: Will not experience complications related to urinary retention Outcome: Completed/Met   Problem: Pain Managment: Goal: General experience of comfort will improve Outcome: Completed/Met   Problem: Safety: Goal: Ability to remain free from injury will improve Outcome: Completed/Met   Problem: Skin Integrity: Goal: Risk for impaired skin integrity will decrease Outcome: Completed/Met

## 2023-05-12 NOTE — Progress Notes (Signed)
   05/12/23 1521  TOC Discharge Assessment  Final next level of care Home w Home Health Services  Once discharged, how will the patient get to their discharge location? Family/Friend - Partnered Transport  Barriers to Discharge No Barriers Identified  Patient states their goals for this hospitalization and ongoing recovery are: none stated  CMS Medicare.gov Compare Post Acute Care list provided to: Patient  Choice offered to / list presented to  Patient  Patient and family notified of of transfer 05/12/23

## 2023-05-12 NOTE — Discharge Summary (Signed)
Physician Discharge Summary   Patient: John Fisher MRN: [id number] DOB: 1956-08-21  Admit date:     05/09/2023  Discharge date: 05/12/23  Discharge Physician: Jonah Blue   PCP: [facility]   Recommendations at discharge:   Take all home medications as prescribed Eat soft foods until dental issues are resolved Drink enough fluids to stay hydrated Drink Ensure between meals F/u with PCP in 1-2 weeks for recheck including labs  Discharge Diagnoses: Principal Problem:   AKI (acute kidney injury) (HCC) Active Problems:   Dizziness   BPH (benign prostatic hyperplasia)   Alcohol use disorder   Diabetes mellitus (HCC)   Poor dentition   Chronic kidney disease, stage 3b [facility])    Hospital Course: 67yo male with h/o DM, HTN, HLD, stage 3b CKD, and ETOH use d/o presenting with dizziness and pre-syncope. He was last hospitalized from 7/19-22 for a similar presentation. Mildly orthostatic with elevated ETOH level on presentation with AKI.   Assessment and Plan:  AKI superimposed on stage 3b chronic kidney disease (HCC) Patient with recurrent dizziness Not orthostatic on admission Has had home physical therapy, recommended to continue Received IVF on admission with improvement Soft foods at home recommended until dentures in place Appears to have stable renal function at this time, back to baseline Will dc to home    BPH (benign prostatic hyperplasia) Continue finasteride    Alcohol use disorder CIWA with oral ativan PRN Folic acid, multivitamin  Denies having an issue with alcohol    Diabetes mellitus (HCC) A1c 6.4, reasonable control resume Glucophage    Poor dentition Ensure and soft diet   HLD Continue atorvastatin   Depression Continue duloxetine       Consultants: PT OT   Procedures: None   Antibiotics: None     Pain control - [place] Controlled Substance Reporting System database was reviewed. and patient was instructed,  not to drive, operate heavy machinery, perform activities at heights, swimming or participation in water activities or provide baby-sitting services while on Pain, Sleep and Anxiety Medications; until their outpatient Physician has advised to do so again. Also recommended to not to take more than prescribed Pain, Sleep and Anxiety Medications.    Disposition: Home Diet recommendation:  Carb modified diet DISCHARGE MEDICATION: Allergies as of 05/12/2023       Reactions   Trospium Other (See Comments)   Retention of urine (name brand is Sanctura XR)   Alfuzosin Anxiety, Other (See Comments)   Increased frequency of urination, also (name brand is Uroxatral)        Medication List     STOP taking these medications    Advil 200 MG Caps Generic drug: Ibuprofen   naloxone 4 MG/0.1ML Liqd nasal spray kit Commonly known as: NARCAN       TAKE these medications    aspirin 81 MG chewable tablet Chew 81 mg by mouth daily.   atorvastatin 20 MG tablet Commonly known as: LIPITOR Take 20 mg by mouth daily.   cholecalciferol 25 MCG (1000 UNIT) tablet Commonly known as: VITAMIN D3 Take 1,000 Units by mouth daily.   cyanocobalamin 1000 MCG tablet Commonly known as: VITAMIN B12 Take 1,000 mcg by mouth daily.   DULoxetine 60 MG capsule Commonly known as: CYMBALTA Take 60 mg by mouth daily.   ENSURE PLUS PO Take 1 Can by mouth See admin instructions. Drink 1 can of CHOCOLATE Ensure Plus by mouth in the morning and evening   finasteride 5 MG tablet  Commonly known as: PROSCAR Take 5 mg by mouth daily.   fluticasone 50 MCG/ACT nasal spray Commonly known as: FLONASE Place 1 spray into both nostrils 2 (two) times daily as needed for allergies or rhinitis. What changed: how much to take   metFORMIN 500 MG tablet Commonly known as: GLUCOPHAGE Take 500 mg by mouth daily with breakfast.   Voltaren 1 % Gel Generic drug: diclofenac Sodium Apply 4 g topically See admin  instructions. Apply 4 grams of gel to both knees once a day        Discharge Exam: Filed Weights   05/09/23 1717  Weight: 83.5 kg     Subjective: He feels fatigued because he hasn't been sleeping well but feels otherwise ready to go home.  Eating better.  He reports that he is minimally active at home but is willing to continue to work with PT.   Objective: Vitals:   05/12/23 0052 05/12/23 0552  BP: (!) 170/102 (!) 156/103  Pulse: 64 60  Resp:  16  Temp:  98.1 F (36.7 C)  SpO2:  100%   No intake or output data in the 24 hours ending 05/12/23 1346  Filed Weights   05/09/23 1717  Weight: 83.5 kg    Exam:  General:  Appears calm and comfortable and is in NAD Eyes:  EOMI, normal lids, iris ENT:  grossly normal hearing, lips & tongue, mmm; some absent dentition Neck:  no LAD, masses or thyromegaly Cardiovascular:  RRR, no m/r/g. No LE edema.  Respiratory:   CTA bilaterally with no wheezes/rales/rhonchi.  Normal respiratory effort. Abdomen:  soft, NT, ND Skin:  no rash or induration seen on limited exam Musculoskeletal:  grossly normal tone BUE/BLE, good ROM, no bony abnormality Psychiatric: blunted mood and affect, speech fluent and appropriate, AOx3 Neurologic:  CN 2-12 grossly intact, moves all extremities in coordinated fashion  Data Reviewed: I have reviewed the patient's lab results since admission.  Pertinent labs for today include:  Glucose 288 BUN 15/Creatinine 1.81/GFR 40 - stable Unremarkable CBC     Condition at discharge: improving  The results of significant diagnostics from this hospitalization (including imaging, microbiology, ancillary and laboratory) are listed below for reference.   Imaging Studies: US RENAL  Result Date: 05/09/2023 CLINICAL DATA:  Acute renal failure EXAM: RENAL / URINARY TRACT ULTRASOUND COMPLETE COMPARISON:  None Available. FINDINGS: Right Kidney: Renal measurements: 8.6 x 4.4 x 5.3 cm = volume: 103.4 mL. Echogenicity  within normal limits. No mass or hydronephrosis visualized. Left Kidney: Renal measurements: 9.0 x 4.5 x 5.0 cm = volume: 106.7 mL. Echogenicity within normal limits. No mass or hydronephrosis visualized. Bladder: Appears normal for degree of bladder distention. Other: None. IMPRESSION: No hydronephrosis. Electronically Signed   By: Annia Belt M.D.   On: 05/09/2023 20:47    Microbiology: No results found for this or any previous visit.  Labs: CBC: Recent Labs  Lab 05/09/23 1728 05/09/23 2030 05/12/23 1027  WBC 7.3 8.1 5.5  NEUTROABS 5.6  --  3.6  HGB 11.4* 11.6* 11.6*  HCT 36.0* 36.6* 34.8*  MCV 100.6* 101.9* 97.5  PLT 176 146* 149*   Basic Metabolic Panel: Recent Labs  Lab 05/09/23 1728 05/09/23 2030 05/10/23 0422 05/11/23 0342 05/12/23 1027  NA 140  --  136 134* 135  K 3.4*  --  3.7 3.0* 4.0  CL 109  --  109 99 100  CO2 18*  --  23 23 27   GLUCOSE 94  --  105*  151* 288*  BUN 31*  --  22 15 15   CREATININE 2.34* 2.05* 2.06* 1.80* 1.81*  CALCIUM 8.2*  --  8.0* 8.5* 8.7*   Liver Function Tests: No results for input(s): "AST", "ALT", "ALKPHOS", "BILITOT", "PROT", "ALBUMIN" in the last 168 hours. CBG: Recent Labs  Lab 05/11/23 1203 05/11/23 1658 05/11/23 2212 05/12/23 0737 05/12/23 1134  GLUCAP 172* 185* 143* 120* 240*    Discharge time spent: greater than 30 minutes.  Signed: Jonah Blue, MD [facility] 05/12/2023
